# Patient Record
Sex: Female | Born: 1945
Health system: Southern US, Community
[De-identification: ages and names within clinical notes are randomized; demographics above are authoritative.]

## PROBLEM LIST (undated history)

## (undated) DIAGNOSIS — E785 Hyperlipidemia, unspecified: Secondary | ICD-10-CM

## (undated) DIAGNOSIS — N200 Calculus of kidney: Secondary | ICD-10-CM

## (undated) DIAGNOSIS — I10 Essential (primary) hypertension: Secondary | ICD-10-CM

## (undated) DIAGNOSIS — M722 Plantar fascial fibromatosis: Secondary | ICD-10-CM

## (undated) DIAGNOSIS — Z87898 Personal history of other specified conditions: Secondary | ICD-10-CM

## (undated) DIAGNOSIS — F419 Anxiety disorder, unspecified: Secondary | ICD-10-CM

## (undated) DIAGNOSIS — M48061 Spinal stenosis, lumbar region without neurogenic claudication: Secondary | ICD-10-CM

## (undated) HISTORY — DX: Calculus of kidney: N20.0

## (undated) HISTORY — DX: Plantar fascial fibromatosis: M72.2

## (undated) HISTORY — PX: OTHER SURGICAL HISTORY: SHX169

## (undated) HISTORY — DX: Spinal stenosis, lumbar region without neurogenic claudication: M48.061

## (undated) HISTORY — DX: Hyperlipidemia, unspecified: E78.5

## (undated) HISTORY — DX: Anxiety disorder, unspecified: F41.9

## (undated) HISTORY — DX: Personal history of other specified conditions: Z87.898

## (undated) HISTORY — PX: TONSILLECTOMY: SUR1361

## (undated) HISTORY — DX: Essential (primary) hypertension: I10

---

## 1998-11-23 ENCOUNTER — Other Ambulatory Visit: Admission: RE | Admit: 1998-11-23 | Discharge: 1998-11-23 | Payer: Self-pay | Admitting: Obstetrics and Gynecology

## 1999-08-23 ENCOUNTER — Encounter: Admission: RE | Admit: 1999-08-23 | Discharge: 1999-08-23 | Payer: Self-pay | Admitting: Emergency Medicine

## 1999-08-23 ENCOUNTER — Encounter: Payer: Self-pay | Admitting: Emergency Medicine

## 1999-09-29 ENCOUNTER — Encounter: Payer: Self-pay | Admitting: Emergency Medicine

## 1999-09-29 ENCOUNTER — Encounter: Admission: RE | Admit: 1999-09-29 | Discharge: 1999-09-29 | Payer: Self-pay | Admitting: Emergency Medicine

## 1999-12-01 ENCOUNTER — Other Ambulatory Visit: Admission: RE | Admit: 1999-12-01 | Discharge: 1999-12-01 | Payer: Self-pay | Admitting: Obstetrics and Gynecology

## 2000-10-11 ENCOUNTER — Encounter: Payer: Self-pay | Admitting: Obstetrics and Gynecology

## 2000-10-11 ENCOUNTER — Encounter: Admission: RE | Admit: 2000-10-11 | Discharge: 2000-10-11 | Payer: Self-pay | Admitting: Obstetrics and Gynecology

## 2000-12-26 ENCOUNTER — Other Ambulatory Visit: Admission: RE | Admit: 2000-12-26 | Discharge: 2000-12-26 | Payer: Self-pay | Admitting: Obstetrics and Gynecology

## 2001-11-13 ENCOUNTER — Encounter: Payer: Self-pay | Admitting: Obstetrics and Gynecology

## 2001-11-13 ENCOUNTER — Encounter: Admission: RE | Admit: 2001-11-13 | Discharge: 2001-11-13 | Payer: Self-pay | Admitting: Obstetrics and Gynecology

## 2002-01-03 ENCOUNTER — Other Ambulatory Visit: Admission: RE | Admit: 2002-01-03 | Discharge: 2002-01-03 | Payer: Self-pay | Admitting: Obstetrics and Gynecology

## 2002-11-26 ENCOUNTER — Encounter: Admission: RE | Admit: 2002-11-26 | Discharge: 2002-11-26 | Payer: Self-pay | Admitting: Obstetrics and Gynecology

## 2002-11-26 ENCOUNTER — Encounter: Payer: Self-pay | Admitting: Obstetrics and Gynecology

## 2002-11-26 ENCOUNTER — Encounter: Admission: RE | Admit: 2002-11-26 | Discharge: 2002-11-26 | Payer: Self-pay | Admitting: Internal Medicine

## 2003-12-09 ENCOUNTER — Ambulatory Visit (HOSPITAL_COMMUNITY): Admission: RE | Admit: 2003-12-09 | Discharge: 2003-12-09 | Payer: Self-pay | Admitting: Obstetrics and Gynecology

## 2004-02-03 ENCOUNTER — Other Ambulatory Visit: Admission: RE | Admit: 2004-02-03 | Discharge: 2004-02-03 | Payer: Self-pay | Admitting: Obstetrics and Gynecology

## 2004-02-16 ENCOUNTER — Encounter: Admission: RE | Admit: 2004-02-16 | Discharge: 2004-02-16 | Payer: Self-pay | Admitting: Obstetrics and Gynecology

## 2004-07-13 ENCOUNTER — Ambulatory Visit: Payer: Self-pay | Admitting: Internal Medicine

## 2004-08-16 ENCOUNTER — Ambulatory Visit: Payer: Self-pay | Admitting: Internal Medicine

## 2004-10-18 ENCOUNTER — Ambulatory Visit: Payer: Self-pay | Admitting: Internal Medicine

## 2004-10-19 ENCOUNTER — Ambulatory Visit: Payer: Self-pay | Admitting: Internal Medicine

## 2005-06-10 ENCOUNTER — Ambulatory Visit: Payer: Self-pay | Admitting: Internal Medicine

## 2005-06-13 ENCOUNTER — Ambulatory Visit: Payer: Self-pay | Admitting: Internal Medicine

## 2005-09-08 ENCOUNTER — Encounter: Admission: RE | Admit: 2005-09-08 | Discharge: 2005-09-08 | Payer: Self-pay | Admitting: *Deleted

## 2006-10-09 ENCOUNTER — Encounter: Admission: RE | Admit: 2006-10-09 | Discharge: 2006-10-09 | Payer: Self-pay | Admitting: *Deleted

## 2007-01-03 ENCOUNTER — Ambulatory Visit: Payer: Self-pay | Admitting: Internal Medicine

## 2007-01-03 LAB — CONVERTED CEMR LAB
AST: 18 units/L (ref 0–37)
Albumin: 4.3 g/dL (ref 3.5–5.2)
Alkaline Phosphatase: 44 units/L (ref 39–117)
BUN: 20 mg/dL (ref 6–23)
Calcium: 9.3 mg/dL (ref 8.4–10.5)
Chloride: 106 meq/L (ref 96–112)
Creatinine, Ser: 0.7 mg/dL (ref 0.4–1.2)
Direct LDL: 144 mg/dL
Free T4: 0.7 ng/dL (ref 0.6–1.6)
HDL: 40.9 mg/dL (ref >39.0)
Potassium: 4.3 meq/L (ref 3.5–5.1)
Sodium: 143 meq/L (ref 135–145)
Total CHOL/HDL Ratio: 5.5

## 2007-11-15 ENCOUNTER — Telehealth: Payer: Self-pay | Admitting: Internal Medicine

## 2007-12-05 ENCOUNTER — Encounter: Payer: Self-pay | Admitting: *Deleted

## 2007-12-05 DIAGNOSIS — E785 Hyperlipidemia, unspecified: Secondary | ICD-10-CM | POA: Insufficient documentation

## 2007-12-05 DIAGNOSIS — I10 Essential (primary) hypertension: Secondary | ICD-10-CM | POA: Insufficient documentation

## 2007-12-05 DIAGNOSIS — M722 Plantar fascial fibromatosis: Secondary | ICD-10-CM | POA: Insufficient documentation

## 2007-12-05 DIAGNOSIS — Z8659 Personal history of other mental and behavioral disorders: Secondary | ICD-10-CM | POA: Insufficient documentation

## 2007-12-05 DIAGNOSIS — Z9089 Acquired absence of other organs: Secondary | ICD-10-CM | POA: Insufficient documentation

## 2007-12-05 DIAGNOSIS — Z8679 Personal history of other diseases of the circulatory system: Secondary | ICD-10-CM | POA: Insufficient documentation

## 2007-12-18 ENCOUNTER — Ambulatory Visit (HOSPITAL_COMMUNITY): Admission: RE | Admit: 2007-12-18 | Discharge: 2007-12-18 | Payer: Self-pay | Admitting: Obstetrics & Gynecology

## 2008-01-31 ENCOUNTER — Ambulatory Visit: Payer: Self-pay | Admitting: Internal Medicine

## 2008-01-31 DIAGNOSIS — G609 Hereditary and idiopathic neuropathy, unspecified: Secondary | ICD-10-CM | POA: Insufficient documentation

## 2008-01-31 DIAGNOSIS — G4700 Insomnia, unspecified: Secondary | ICD-10-CM | POA: Insufficient documentation

## 2008-01-31 DIAGNOSIS — R002 Palpitations: Secondary | ICD-10-CM | POA: Insufficient documentation

## 2008-01-31 DIAGNOSIS — R209 Unspecified disturbances of skin sensation: Secondary | ICD-10-CM | POA: Insufficient documentation

## 2008-01-31 DIAGNOSIS — M25519 Pain in unspecified shoulder: Secondary | ICD-10-CM | POA: Insufficient documentation

## 2008-02-13 ENCOUNTER — Encounter: Payer: Self-pay | Admitting: Internal Medicine

## 2008-02-19 ENCOUNTER — Encounter: Admission: RE | Admit: 2008-02-19 | Discharge: 2008-02-19 | Payer: Self-pay | Admitting: Obstetrics and Gynecology

## 2008-03-03 ENCOUNTER — Telehealth: Payer: Self-pay | Admitting: Internal Medicine

## 2008-06-05 ENCOUNTER — Ambulatory Visit: Payer: Self-pay | Admitting: Internal Medicine

## 2008-06-05 LAB — CONVERTED CEMR LAB
Cholesterol: 238 mg/dL (ref 0–200)
Direct LDL: 166.7 mg/dL
GFR calc Af Amer: 110 mL/min
Glucose, Bld: 95 mg/dL (ref 70–99)
HDL: 43.5 mg/dL (ref >39.0)
Hgb A1c MFr Bld: 5.5 % (ref 4.6–6.0)
Potassium: 3.9 meq/L (ref 3.5–5.1)
Sodium: 141 meq/L (ref 135–145)

## 2008-06-06 ENCOUNTER — Encounter: Payer: Self-pay | Admitting: Internal Medicine

## 2009-01-20 ENCOUNTER — Ambulatory Visit (HOSPITAL_COMMUNITY): Admission: RE | Admit: 2009-01-20 | Discharge: 2009-01-20 | Payer: Self-pay | Admitting: Obstetrics and Gynecology

## 2009-02-24 ENCOUNTER — Telehealth: Payer: Self-pay | Admitting: Internal Medicine

## 2009-09-18 ENCOUNTER — Telehealth (INDEPENDENT_AMBULATORY_CARE_PROVIDER_SITE_OTHER): Payer: Self-pay | Admitting: *Deleted

## 2009-09-23 ENCOUNTER — Ambulatory Visit: Payer: Self-pay | Admitting: Internal Medicine

## 2009-09-23 LAB — CONVERTED CEMR LAB
ALT: 33 units/L (ref 0–35)
AST: 24 units/L (ref 0–37)
Albumin: 4.6 g/dL (ref 3.5–5.2)
Alkaline Phosphatase: 37 units/L — ABNORMAL LOW (ref 39–117)
BUN: 17 mg/dL (ref 6–23)
Basophils Relative: 0.9 % (ref 0.0–3.0)
Bilirubin, Direct: 0.1 mg/dL (ref 0.0–0.3)
Chloride: 106 meq/L (ref 96–112)
Cholesterol: 131 mg/dL (ref 0–200)
Creatinine, Ser: 0.7 mg/dL (ref 0.4–1.2)
GFR calc non Af Amer: 90.19 mL/min (ref >60)
HCT: 42.1 % (ref 36.0–46.0)
Hemoglobin, Urine: NEGATIVE
Hemoglobin: 14 g/dL (ref 12.0–15.0)
Hgb A1c MFr Bld: 5.6 % (ref 4.6–6.5)
LDL Cholesterol: 61 mg/dL (ref 0–99)
Lymphocytes Relative: 29.3 % (ref 12.0–46.0)
Monocytes Absolute: 0.4 10*3/uL (ref 0.1–1.0)
Monocytes Relative: 7.6 % (ref 3.0–12.0)
Neutro Abs: 2.9 10*3/uL (ref 1.4–7.7)
Neutrophils Relative %: 58.1 % (ref 43.0–77.0)
Platelets: 196 10*3/uL (ref 150.0–400.0)
RBC: 4.38 M/uL (ref 3.87–5.11)
RDW: 11.8 % (ref 11.5–14.6)
Sodium: 143 meq/L (ref 135–145)
Specific Gravity, Urine: 1.02 (ref 1.000–1.030)
TSH: 1.01 microintl units/mL (ref 0.35–5.50)
Urine Glucose: NEGATIVE mg/dL
pH: 6.5 (ref 5.0–8.0)

## 2009-09-30 ENCOUNTER — Ambulatory Visit: Payer: Self-pay | Admitting: Internal Medicine

## 2010-02-09 ENCOUNTER — Ambulatory Visit (HOSPITAL_COMMUNITY): Admission: RE | Admit: 2010-02-09 | Discharge: 2010-02-09 | Payer: Self-pay | Admitting: Obstetrics and Gynecology

## 2010-10-07 NOTE — Progress Notes (Signed)
Summary: LABS  Phone Note Call from Patient Call back at Home Phone [phone] Call back at [phone]   Caller: Patient Call For: Jacques Navy MD Summary of Call: Pt sched CPX for 09/30/09 with labs previous week, pt requesting labs for diabetes,thyroid and blood test for ovarian ca - if this is possible. Please advise. CPX labs are in computer for wk of 1/18. Initial call taken by: Verdell Face,  September 18, 2009 10:52 AM  Follow-up for Phone Call        routine CPX should include TSH for thyroid, serum glucose. OK to add AIC 790.6. There is no recommendation for any tumor marker screening for ovarian cancer in the absence of real risk factors, i.e. 1st degree relative with ovarian cancer or any clinical signs of pelvic pathology.  Follow-up by: Jacques Navy MD,  September 18, 2009 5:51 PM  Additional Follow-up for Phone Call Additional follow up Details #1::        Pt called back to see about these labs, CA 125 is the lab she is requesting, please call pt. Additional Follow-up by: Verdell Face,  September 21, 2009 9:30 AM    Additional Follow-up for Phone Call Additional follow up Details #2::    I spoke with patient. Please add A1c 790.6 to existing labs. THANKS.....................Marland KitchenLamar Sprinkles, CMA  September 21, 2009 10:03 AM   Additional Follow-up for Phone Call Additional follow up Details #3:: Details for Additional Follow-up Action Taken: Labs added to order. Additional Follow-up by: Verdell Face,  September 21, 2009 10:07 AM

## 2010-10-07 NOTE — Assessment & Plan Note (Signed)
Summary: CPX/BCBS/#/CD   Vital Signs:  Patient profile:   65 year old female Height:      66 inches Weight:      154 pounds BMI:     24.95 O2 Sat:      97 % on Room air Temp:     98.1 degrees F oral Pulse rate:   68 / minute BP sitting:   126 / 88  (left arm) Cuff size:   regular  Vitals Entered By: Bill Salinas CMA (September 30, 2009 11:32 AM)  O2 Flow:  Room air  Primary Care Provider:  norins   History of Present Illness: Patient presents for a routine exam.  At bedtime she will have a sense of hard to describe discomfort that is transient  On-going problem with burning feet, worse over the great toes. Chart reveiwed: nerve conduction study done in '09 was normal.   Onychyomycosis of the left great nail.   MSK- right shoulder pain (h/o bursitis), right hip pain, right knee pain with lesser pain left knee, hands are stiffness without morning gel and sore with enlarged PIP joints. She did get relief with aleve.  Occasional tickle type cough. Her mother was a non-smoker who got lung cancer. Several aunts also had lung disease (cancer?)   Current Medications (verified): 1)  Metoprolol Tartrate 50 Mg  Tabs (Metoprolol Tartrate) .Marland Kitchen.. 1 Two Times A Day Untill Toprol Xl Is Back 2)  Aspirin 81 Mg  Tabs (Aspirin) .... Take 1 Tablet By Mouth Once A Day 3)  Biotin 1000 Mcg  Tabs (Biotin) .... Take 1 Tablet By Mouth Once A Day 4)  Multivitamins   Tabs (Multiple Vitamin) .... Take Once Daily 5)  Caltrate 600+d 600-400 Mg-Unit  Tabs (Calcium Carbonate-Vitamin D) .... Take Once Daily 6)  Acidophilus   Tabs (Lactobacillus) .... Take Daily As Directed. 7)  Simvastatin 40 Mg Tabs (Simvastatin) .Marland Kitchen.. 1 By Mouth Q Pm 8)  Lamisil 250 Mg Tabs (Terbinafine Hcl)  Allergies (verified): No Known Drug Allergies  Past History:  Past Medical History: Last updated: 12/05/2007 ANXIETY DISORDER, HX OF (ICD-V11.2) PLANTAR FASCIITIS (ICD-728.71) CHEST PAIN, HX OF (ICD-V12.50) HYPERLIPIDEMIA  (ICD-272.4) HYPERTENSION (ICD-401.9)    Past Surgical History: Last updated: 12/05/2007 CAESAREAN SECTION, HX OF (ICD-V39.01) TONSILLECTOMY, HX OF (ICD-V45.79)  Family History: Last updated: 2008/06/16 Father - deceased @ 30: CAd/MI-fatal, epilectic due to war injury Mother - deceased @ 53: Lung cancer (non-smoker),  Neg- breast or colon cancer; DM  Social History: Last updated: Jun 16, 2008 HSG, community college Married '68 1 daughter - '80, 1 son - '77; 2 grandchildren work: was a Diplomatic Services operational officer, full-time homemaker marriage in good health End-of-Care: no CPR, no mechanical ventilation, no futile or heroic measures.   Risk Factors: Alcohol Use: <1 (2008/06/16) Caffeine Use: 1 (06/16/2008) Exercise: no (June 16, 2008)  Risk Factors: Smoking Status: quit (12/05/2007) Passive Smoke Exposure: no (16-Jun-2008)  Review of Systems  The patient denies anorexia, fever, weight loss, weight gain, vision loss, chest pain, syncope, dyspnea on exertion, prolonged cough, headaches, abdominal pain, melena, hematochezia, severe indigestion/heartburn, incontinence, muscle weakness, difficulty walking, depression, abnormal bleeding, enlarged lymph nodes, and angioedema.    Physical Exam  General:  Well-developed,well-nourished,in no acute distress; alert,appropriate and cooperative throughout examination Head:  Normocephalic and atraumatic without obvious abnormalities. No apparent alopecia or balding. Eyes:  pupils equal, pupils round, corneas and lenses clear, no injection, and no optic disk abnormalities.   Ears:  R ear normal and L ear normal.   Nose:  no  external deformity and no external erythema.   Mouth:  Oral mucosa and oropharynx without lesions or exudates.  Teeth in good repair. Neck:  full ROM, no thyromegaly, and no carotid bruits.   Breasts:  deferred to gyn Lungs:  Normal respiratory effort, chest expands symmetrically. Lungs are clear to auscultation, no crackles or  wheezes. Heart:  Normal rate and regular rhythm. S1 and S2 normal without gallop, murmur, click, rub or other extra sounds. Abdomen:  soft, normal bowel sounds, no masses, no guarding, and no hepatomegaly.   Genitalia:  deferred to gyn Msk:  normal ROM, no joint tenderness, no joint warmth, no redness over joints, and no joint deformities.  Mild enlargement of the PIP joint ring finer right hand. Pulses:  2+ radial and DP pulses Extremities:  No clubbing, cyanosis, edema, or deformity noted with normal full range of motion of all joints.   Neurologic:  No cranial nerve deficits noted. Station and gait are normal. Plantar reflexes are down-going bilaterally. DTRs are symmetrical throughout. Sensory, motor and coordinative functions appear intact. Skin:  turgor normal, color normal, no rashes, no suspicious lesions, no petechiae, and no ulcerations.  Minimal thickening of the left great toenail. Cervical Nodes:  no anterior cervical adenopathy and no posterior cervical adenopathy.   Psych:  Oriented X3, memory intact for recent and remote, normally interactive, good eye contact, and not anxious appearing.    Patient: Kimberly Cox Note: All result statuses are Final unless otherwise noted.  Tests: (1) BMP (METABOL)   Sodium                    143 mEq/L                   135-145   Potassium                 3.8 mEq/L                   3.5-5.1   Chloride                  106 mEq/L                   96-112   Carbon Dioxide            32 mEq/L                    19-32   Glucose                   89 mg/dL                    19-14   BUN                       17 mg/dL                    7-82   Creatinine                0.7 mg/dL                   9.5-6.2   Calcium                   9.5 mg/dL                   1.3-08.6   GFR  90.19 mL/min                >60  Tests: (2) CBC Platelet w/Diff (CBCD)   White Cell Count          4.9 K/uL                    4.5-10.5   Red Cell Count             4.38 Mil/uL                 3.87-5.11   Hemoglobin                14.0 g/dL                   16.1-09.6   Hematocrit                42.1 %                      36.0-46.0   MCV                       96.1 fl                     78.0-100.0   MCHC                      33.2 g/dL                   04.5-40.9   RDW                       11.8 %                      11.5-14.6   Platelet Count            196.0 K/uL                  150.0-400.0   Neutrophil %              58.1 %                      43.0-77.0   Lymphocyte %              29.3 %                      12.0-46.0   Monocyte %                7.6 %                       3.0-12.0   Eosinophils%              4.1 %                       0.0-5.0   Basophils %               0.9 %                       0.0-3.0   Neutrophill Absolute      2.9 K/uL                    1.4-7.7   Lymphocyte Absolute  1.4 K/uL                    0.7-4.0   Monocyte Absolute         0.4 K/uL                    0.1-1.0  Eosinophils, Absolute                             0.2 K/uL                    0.0-0.7   Basophils Absolute        0.0 K/uL                    0.0-0.1  Tests: (3) Hepatic/Liver Function Panel (HEPATIC)   Total Bilirubin           0.9 mg/dL                   1.6-1.0   Direct Bilirubin          0.1 mg/dL                   9.6-0.4   Alkaline Phosphatase [L]  37 U/L                      39-117   AST                       24 U/L                      0-37   ALT                       33 U/L                      0-35   Total Protein             6.9 g/dL                    5.4-0.9   Albumin                   4.6 g/dL                    8.1-1.9  Tests: (4) TSH (TSH)   FastTSH                   1.01 uIU/mL                 0.35-5.50  Tests: (5) Lipid Panel (LIPID)   Cholesterol               131 mg/dL                   1-478     ATP III Classification            Desirable:  < 200 mg/dL                    Borderline High:  200 - 239 mg/dL                High:  > = 240 mg/dL   Triglycerides  106.0 mg/dL                 4.0-981.1     Normal:  <150 mg/dL     Borderline High:  914 - 199 mg/dL   HDL                       78.29 mg/dL                 >56.21   VLDL Cholesterol          21.2 mg/dL                  3.0-86.5   LDL Cholesterol           61 mg/dL                    7-84  CHO/HDL Ratio:  CHD Risk                             3                    Men          Women     1/2 Average Risk     3.4          3.3     Average Risk          5.0          4.4     2X Average Risk          9.6          7.1     3X Average Risk          15.0          11.0                           Tests: (6) UDip Only (UDIP)   Color                     LT. YELLOW       RANGE:  Yellow;Lt. Yellow   Clarity                   CLEAR                       Clear   Specific Gravity          1.020                       1.000 - 1.030   Urine Ph                  6.5                         5.0-8.0   Protein                   TRACE                       Negative   Urine Glucose             NEGATIVE                    Negative   Ketones  TRACE                       Negative   Urine Bilirubin           NEGATIVE                    Negative   Blood                     NEGATIVE                    Negative   Urobilinogen              0.2                         0.0 - 1.0   Leukocyte Esterace        NEGATIVE                    Negative   Nitrite                   NEGATIVE                    Negative  Tests: (7) Hemoglobin A1C (A1C)   Hemoglobin A1C            5.6 %                       4.6-6.5     Glycemic Control Guidelines for People with Diabetes:     Non Diabetic:  <6%     Goal of Therapy: <7%     Additional Action Suggested:  >8%  DG CHEST 2 VIEW - [id number]   Clinical Data: Chest pain, cough, former smoker   CHEST - 2 VIEW   Comparison: None   Findings: The lungs are clear.  Mediastinal contours are normal. The heart is within normal  limits in size.  No bony abnormality is seen.   IMPRESSION: No active lung disease.   Read By:  Juline Patch,  M.D.  Impression & Recommendations:  Problem # 1:  DISTURBANCE OF SKIN SENSATION (ICD-782.0) Continued burning feet. No evidence of diabetes. Her MCV is normal making B12 deficiency unlikely. Nerve conduction study of '09 normal. Exam is normal. Discussed treatment options including Lyrica, gabapentin or amytriptyline.  Plan - continued watchful waiting - however no evidence of any significant underlying disease  Problem # 2:  SHOULDER PAIN, LEFT (ICD-719.41) Continued discomfort as well as generalized mild joint pain. Exam negative for inflammatory changes, i.e. Rheumatoid arthritis and suspect this is osteoarthritis at an early stage.  Plan  range of motion exercise, i.e. yoga, tai chi, etc.         heat          over the counter NSAIDs, e.g. aleve  Her updated medication list for this problem includes:    Aspirin 81 Mg Tabs (Aspirin) .Marland Kitchen... Take 1 tablet by mouth once a day  Problem # 3:  PALPITATIONS, OCCASIONAL (ICD-785.1) Seems stable. EKG with sinus rhythm with no PVCs or other abnormalities. Reveiwed prior event recorder report - occasional PVCs with no significant abnormal rhythm. Her symptoms are quiesscent.  Plan - continue beta-blocker.  Her updated medication list for this problem includes:    Metoprolol Tartrate 50 Mg Tabs (Metoprolol tartrate) .Marland Kitchen... 1 two times a day untill toprol  xl is back  Problem # 4:  HYPERLIPIDEMIA (ICD-272.4) Great control with LDL 61 down from 166 prior to taking medication.  Plan - continue present meds.  The following medications were removed from the medication list:    Policosanol 10 Mg Caps (Policosanol) .Marland Kitchen... Take twice daily Her updated medication list for this problem includes:    Simvastatin 40 Mg Tabs (Simvastatin) .Marland Kitchen... 1 by mouth q pm  Problem # 5:  HYPERTENSION (ICD-401.9)  Her updated medication list for this  problem includes:    Metoprolol Tartrate 50 Mg Tabs (Metoprolol tartrate) .Marland Kitchen... 1 two times a day untill toprol xl is back  BP today: 126/88 Prior BP: 130/82 (06/05/2008)  Good control with no lab abnormality.  Plan - continue beta-blocker.  Problem # 6:  Preventive Health Care (ICD-V70.0) She is current with her gyn, current with mammography, current with colorectal cancer screening. Her exam is normal. EKG with left anterior fasicular  block with no evidence of ischemic hear disease. Chest x-ray is clear. Labs are excellent.   In summary - a nice woman with stable problems with no evidence of worrisome underlying disease. If there is concern for occult disease I have suggested a full executive type evaluation at [facility], [facility]. She will return on a as needed basis or in one year.  Complete Medication List: 1)  Metoprolol Tartrate 50 Mg Tabs (Metoprolol tartrate) .Marland Kitchen.. 1 two times a day untill toprol xl is back 2)  Aspirin 81 Mg Tabs (Aspirin) .... Take 1 tablet by mouth once a day 3)  Biotin 1000 Mcg Tabs (Biotin) .... Take 1 tablet by mouth once a day 4)  Multivitamins Tabs (Multiple vitamin) .... Take once daily 5)  Caltrate 600+d 600-400 Mg-unit Tabs (Calcium carbonate-vitamin d) .... Take once daily 6)  Acidophilus Tabs (Lactobacillus) .... Take daily as directed. 7)  Simvastatin 40 Mg Tabs (Simvastatin) .Marland Kitchen.. 1 by mouth q pm 8)  Lamisil 250 Mg Tabs (Terbinafine hcl)  Other Orders: T-2 View CXR (71020TC) Admin 1st Vaccine (16109) Flu Vaccine 33yrs + (60454)     Flu Vaccine Consent Questions     Do you have a history of severe allergic reactions to this vaccine? no    Any prior history of allergic reactions to egg and/or gelatin? no    Do you have a sensitivity to the preservative Thimersol? no    Do you have a past history of Guillan-Barre Syndrome? no    Do you currently have an acute febrile illness? no    Have you ever had a severe reaction to latex?  no    Vaccine information given and explained to patient? yes    Are you currently pregnant? no    Lot Number:[id number]   Exp Date:03/04/2010   Site Given right Deltoid IM

## 2011-01-10 ENCOUNTER — Other Ambulatory Visit: Payer: Self-pay | Admitting: Internal Medicine

## 2011-01-21 NOTE — Assessment & Plan Note (Signed)
[facility]                           PRIMARY CARE OFFICE NOTE   Kimberly Cox, Kimberly Cox                       MRN:          [id number]  DATE:01/03/2007                            DOB:          [birth year]    HISTORY OF PRESENT ILLNESS:  Kimberly Cox is a 65 year old Caucasian  woman who presents for follow up evaluation and exam.  She was last seen  in the office June 13, 2005, for follow up of blood pressure.  The  patient, also at that time, had hyperlipidemia with an LDL cholesterol  that was 829.5.  I discussed this with her at the time and recommended  Vytorin.  In the interval, she has not taken any medication, preferring  lifestyle management, and using over-the-counter herbal remedies.   CHIEF COMPLAINT:  1. The patient reports an episode of being lightheaded and slightly      dizzy after along day in the car.  She reports taking her blood      pressure in associated with these symptoms, and it was mildly      elevated.  2. Hypertension.  The patient reports she is usually well controlled,      checking her blood pressure at home.  3. The patient complains of the sensation of having very hot feet and      hands at night, also has noted taut skin.  She does not seem to      have any temperature regulation problems during the day time.  4. MSK.  The patient reports she is having increasing joint pain in      her hands with increasing morning stiffness.   PAST SURGICAL HISTORY:  1. Tonsillectomy, remote.  2. C-section with her second child.   PAST MEDICAL HISTORY:  1. Usual childhood disease.  2. Hypertension.  3. History of chest pain with negative workup including negative      Cardiolite study in 2001.  4. Hyperlipidemia.  5. Plantar fasciitis.   GYNECOLOGY HISTORY:  The patient is gravida 2, para 2 and is  postmenopausal.   FAMILY HISTORY:  Well documented in my chart notes.   SOCIAL HISTORY:  Likewise, well documented with no  significant change.   CURRENT MEDICATIONS:  1. Aspirin 81 mg two tablets daily.  2. Biotin 5 mg daily.  3. Multivitamin daily.  4. Folic acid daily.  5. Toprol XL 50 mg daily.  6. Caltrate with D daily.  7. Policosanol, herbal for cholesterol, twice daily.   HEALTH MAINTENANCE:  The patient reports seeing Dr. Gildardo Griffes in  March 2008 with a normal pelvic and Pap smear.  The patient reports  having a normal mammogram in 2008.  Her last colonoscopy was in 2004 and  was a normal study with repeat in 2014.   REVIEW OF SYSTEMS:  The patient has had no fever, sweats or chills or  other constitutional symptoms.  She has had an eye exam in the last 12  months.  No ENT complaints.  CARDIOVASCULAR:  Significant for very rare  palpitations, but no chest pain, chest discomfort or  decreased exercise  tolerance.  No respiratory, GI or GU complaints.  No musculoskeletal  pains except as noted above.   PHYSICAL EXAMINATION:  VITAL SIGNS:  Temperature 98.1, blood pressure  142/89, pulse 67, weight 150, BMI 23.7 in normal range.  GENERAL APPEARANCE:  This is a well-developed, well-nourished, well-  groomed woman in no acute distress.  HEENT:  Normocephalic, atraumatic.  EAC's and TM's are normal.  Oropharynx was negative.  Dentition in good repair.  Conjunctivae and  sclerae were clear.  PERRLA.  Funduscopic examination was referred to  Ophthalmology.  NECK:  Supple without thyromegaly.  NODES:  No adenopathy was noted in the cervical or supraclavicular  regions.  CHEST:  No CVA tenderness.  LUNGS:  Clear to auscultation and percussion.  BREASTS:  Deferred to Gynecology.  CARDIOVASCULAR:  Radial pulses 2+.  She had a quite precordium with  regular rate and rhythm without murmurs, rubs or gallops.  She had good  dorsalis pedis pulses.  ABDOMEN:  Soft, no guarding or rebound.  No organosplenomegaly was  noted.  RECTAL/VAGINAL:  Deferred to Gynecology.  EXTREMITIES:  Without cyanosis,  clubbing, edema or deformity.  No  synovial thickening.  No joint erythema or enlargement.  She has full  range of motion.  SKIN:  Clear.   DATABASE:  Lipid panel with a cholesterol of 224, triglycerides 194, HDL  was 40.9, LDL cholesterol was 144, free T4 was 0.7 in normal range, free  T3 was 2.5 in normal range.  TSH was 1.0 in normal range.  Comprehensive  metabolic panel revealed normal electrolytes.  Blood sugar was 101.  Kidney function was normal with creatinine of 0.7.  Liver functions were  normal.   ASSESSMENT/PLAN:  1. Hypertension.  The patient's blood pressure seems adequately      controlled on her present medical regimen.  She will continue the      same.  2. Sensation of hot feet and hands at night.  The patient's thyroid      functions were normal.  Physical exam was unremarkable.  She has no      evidence or other symptoms of the climacteric.  Although, this is a      possibility.  No other metabolic derangement is suggested.  Plan is      watchful waiting.  Further evaluation if her symptoms persist or      become worse.  3. MSK.  Patient with mild osteoarthritis with no joint deformity or      involvement.  Would have her use __________ of choice.  Heat to      warm up her hands in the morning, and non-steroidal anti-      inflammatory drugs as needed.  Range of motion exercises on a      regular basis.  4. Lipids.  The patient has done a good job in regards to controlling      her LDL cholesterol and for accommodation of appropriate diet as      well as what every herbal products she is taking, having brought      her LDL cholesterol down from 045.4 to 144.  Ideal would be for the      patient to have an LDL of less than 130.  However, NCEP guidelines      would not recommend treatment for an LDL cholesterol that is less      than 160.  Plan is for patient to continue on her present regimen.  5.  Health maintenance.  The patient is current and up to date with      both gynecologic care as well as colorectal cancer screening and      mammography.   SUMMARY:  This is a very pleasant woman with minor problems as outlined  above.  She should return to see me on a p.r.n. basis or in one year.     Rosalyn Gess Norins, MD  Electronically Signed    MEN/MedQ  DD: 01/03/2007  DT: 01/04/2007  Job #: [id number]   cc:   Drue Novel

## 2011-02-02 ENCOUNTER — Other Ambulatory Visit: Payer: Self-pay | Admitting: Internal Medicine

## 2011-02-02 NOTE — Telephone Encounter (Signed)
Rx done. No refills--last OV 09/2009--note to pharmacy for Pt/must have OV for future refills.

## 2011-02-09 ENCOUNTER — Other Ambulatory Visit: Payer: Self-pay | Admitting: Internal Medicine

## 2011-03-07 ENCOUNTER — Other Ambulatory Visit: Payer: Self-pay | Admitting: Internal Medicine

## 2011-03-14 ENCOUNTER — Encounter: Payer: Self-pay | Admitting: Internal Medicine

## 2011-03-14 ENCOUNTER — Ambulatory Visit (INDEPENDENT_AMBULATORY_CARE_PROVIDER_SITE_OTHER)
Admission: RE | Admit: 2011-03-14 | Discharge: 2011-03-14 | Disposition: A | Discharge status: Home / Self Care | Payer: BC Managed Care – PPO | Source: Ambulatory Visit | Attending: Internal Medicine | Admitting: Internal Medicine

## 2011-03-14 ENCOUNTER — Other Ambulatory Visit (HOSPITAL_COMMUNITY): Payer: Self-pay | Admitting: Obstetrics and Gynecology

## 2011-03-14 ENCOUNTER — Ambulatory Visit (INDEPENDENT_AMBULATORY_CARE_PROVIDER_SITE_OTHER): Payer: BC Managed Care – PPO | Admitting: Internal Medicine

## 2011-03-14 VITALS — BP 138/82 | HR 73 | Temp 97.9°F | Wt 153.0 lb

## 2011-03-14 DIAGNOSIS — Z1231 Encounter for screening mammogram for malignant neoplasm of breast: Secondary | ICD-10-CM

## 2011-03-14 DIAGNOSIS — M79602 Pain in left arm: Secondary | ICD-10-CM

## 2011-03-14 DIAGNOSIS — R209 Unspecified disturbances of skin sensation: Secondary | ICD-10-CM

## 2011-03-14 DIAGNOSIS — M79609 Pain in unspecified limb: Secondary | ICD-10-CM

## 2011-03-14 NOTE — Patient Instructions (Signed)
Arm pain - exam does not reveal any serious deficit to suggest a severe problem. May very well have a problem with nerve root pressure at C5-6. Plan - x-rays of the neck to assess for degenerative joint or disk disease. For discomfort ok to continue with ibuprofen 800mg  3 times a day - do watch for any GI irritation. If the x-rays are OK and the pain is not relieved with ibuprofen will consider a short course of steroids.

## 2011-03-14 NOTE — Progress Notes (Signed)
  Subjective:    Patient ID: Kimberly Cox, female    DOB: 03/15/1946, 65 y.o.   MRN: [id number]  HPI Mrs. Smead presents with a 10 day h/o diiscomfort in the left arm and hand. She describes very mild weakness and very mild paresthesia, mostly affecting the thumb. She does recall carrying her 42 lb grandchild the night before the discomfort started. She does report that 20 years ago she was evaluated for similar symptoms including NCS that indicated possible C5-6 disk degeneration.   PMH, FamHx and SocHx reviewed for any changes and relevance.    Review of Systems  Constitutional: Negative.   HENT: Negative.   Eyes: Negative.   Respiratory: Negative.   Cardiovascular: Negative.   Gastrointestinal: Negative.   Genitourinary: Negative.   Musculoskeletal:       See HPI for arm pain   Neurological: Positive for numbness. Negative for tremors, facial asymmetry, weakness and headaches.  Hematological: Negative.   Psychiatric/Behavioral: Negative.        Objective:   Physical Exam Vitls noted Gen'l - WNWD white woman in no distress Neck - full active ROM Neuro - left UE and hand with normal strength, normal DTRs, normal sensation to light touch and pin prick; minimally decreased sensation to deep vibratory stimulus.        Assessment & Plan:

## 2011-03-15 ENCOUNTER — Telehealth: Payer: Self-pay | Admitting: *Deleted

## 2011-03-15 MED ORDER — PREDNISONE 10 MG PO TABS: 10.0000 mg | ORAL_TABLET | Freq: Every day | ORAL | Status: DC

## 2011-03-15 NOTE — Telephone Encounter (Signed)
Patient requesting results of xray and would like rx if needed.

## 2011-03-15 NOTE — Assessment & Plan Note (Signed)
Aching pain in the left arm with minimal paresthesia left thumb. Exam is unrevealing for any joint abnormality and there are no radicular findings on exam.  Plan - C-spine films to assess for DDD C5-6 level.  Addendum:  Clinical Data: Left arm/hand pain, evaluate for C5-6 nerve root  encroachment  CERVICAL SPINE - 2-3 VIEW  Comparison: None.  Findings: The cervical spine is visualized to C7 on the lateral  view.  Straightening of the cervical spine, likely positional.  No evidence of fracture or dislocation. The vertebral body heights  are maintained. The dens appears intact. Lateral masses of C1 are  symmetric.  No prevertebral soft tissue swelling.  Multilevel degenerative changes of the lower cervical spine.  IMPRESSION:  No fracture or dislocation is seen.  Multilevel degenerative changes of the lower cervical spine.  Please note that bilateral oblique views to evaluate the neural  foramina were not obtained.  Original Report Authenticated By: Charline Bills, M.D.  On review of images there is loss of disk height worse in the lower cervical region along with degenerative changes  Suspect minor nerve root compression - but no marked radicular findings  Plan - prednisone burst and taper.

## 2011-03-15 NOTE — Telephone Encounter (Signed)
Patient called back again upset b/c she was told that MD would get results that day and hear about treatment plan. Spoke w/pt and explained plan and that rx should be ready for pickup.

## 2011-03-15 NOTE — Telephone Encounter (Signed)
Kimberly Cox was supposed to call - copied office note to her with Rx for prednisone burst and taper sent to pharmacy already.

## 2011-04-14 ENCOUNTER — Other Ambulatory Visit: Payer: Self-pay | Admitting: Internal Medicine

## 2011-06-07 ENCOUNTER — Ambulatory Visit (HOSPITAL_COMMUNITY)
Admission: RE | Admit: 2011-06-07 | Discharge: 2011-06-07 | Disposition: A | Discharge status: Home / Self Care | Payer: BC Managed Care – PPO | Source: Ambulatory Visit | Attending: Obstetrics and Gynecology | Admitting: Obstetrics and Gynecology

## 2011-06-07 DIAGNOSIS — Z1231 Encounter for screening mammogram for malignant neoplasm of breast: Secondary | ICD-10-CM

## 2011-10-05 ENCOUNTER — Other Ambulatory Visit: Payer: Self-pay | Admitting: Internal Medicine

## 2011-10-05 NOTE — Telephone Encounter (Signed)
Done

## 2011-10-15 ENCOUNTER — Other Ambulatory Visit: Payer: Self-pay | Admitting: Internal Medicine

## 2011-10-17 NOTE — Telephone Encounter (Signed)
Done

## 2011-11-25 ENCOUNTER — Other Ambulatory Visit: Payer: Self-pay

## 2011-11-25 MED ORDER — PREDNISONE 10 MG PO TABS: 10.0000 mg | ORAL_TABLET | Freq: Every day | ORAL | Status: AC

## 2011-11-25 NOTE — Telephone Encounter (Signed)
Pt called stating that she is experiencing arm pain related to pinched nerve ( seen for same 03/2011 by [facility]). Pt is requesting rx refill of prednisone, please advise.

## 2011-11-26 NOTE — Telephone Encounter (Signed)
Pt advised that Rx has been approved and to call back with any further questions or concerns.

## 2011-12-07 ENCOUNTER — Encounter: Payer: Self-pay | Admitting: Internal Medicine

## 2011-12-28 ENCOUNTER — Ambulatory Visit: Payer: BC Managed Care – PPO | Admitting: Internal Medicine

## 2012-01-17 ENCOUNTER — Ambulatory Visit: Payer: BC Managed Care – PPO | Admitting: Internal Medicine

## 2012-01-17 DIAGNOSIS — Z0289 Encounter for other administrative examinations: Secondary | ICD-10-CM

## 2012-05-10 ENCOUNTER — Other Ambulatory Visit (HOSPITAL_COMMUNITY): Payer: Self-pay | Admitting: Obstetrics and Gynecology

## 2012-05-10 DIAGNOSIS — Z1231 Encounter for screening mammogram for malignant neoplasm of breast: Secondary | ICD-10-CM

## 2012-06-12 ENCOUNTER — Ambulatory Visit (HOSPITAL_COMMUNITY)
Admission: RE | Admit: 2012-06-12 | Discharge: 2012-06-12 | Disposition: A | Discharge status: Home / Self Care | Payer: BC Managed Care – PPO | Source: Ambulatory Visit | Attending: Obstetrics and Gynecology | Admitting: Obstetrics and Gynecology

## 2012-06-12 DIAGNOSIS — Z1231 Encounter for screening mammogram for malignant neoplasm of breast: Secondary | ICD-10-CM | POA: Insufficient documentation

## 2012-07-22 ENCOUNTER — Other Ambulatory Visit: Payer: Self-pay | Admitting: Internal Medicine

## 2012-07-27 ENCOUNTER — Encounter: Payer: Self-pay | Admitting: Internal Medicine

## 2012-07-27 ENCOUNTER — Other Ambulatory Visit (INDEPENDENT_AMBULATORY_CARE_PROVIDER_SITE_OTHER): Payer: BC Managed Care – PPO

## 2012-07-27 ENCOUNTER — Ambulatory Visit (INDEPENDENT_AMBULATORY_CARE_PROVIDER_SITE_OTHER): Payer: BC Managed Care – PPO | Admitting: Internal Medicine

## 2012-07-27 VITALS — BP 176/100 | HR 80 | Temp 98.8°F | Resp 16 | Ht 65.5 in | Wt 157.0 lb

## 2012-07-27 DIAGNOSIS — I10 Essential (primary) hypertension: Secondary | ICD-10-CM

## 2012-07-27 DIAGNOSIS — Z23 Encounter for immunization: Secondary | ICD-10-CM

## 2012-07-27 LAB — URINALYSIS, ROUTINE W REFLEX MICROSCOPIC
Bilirubin Urine: NEGATIVE
Ketones, ur: NEGATIVE
Specific Gravity, Urine: 1.025 (ref 1.000–1.030)
Total Protein, Urine: 30
Urine Glucose: NEGATIVE
pH: 6.5 (ref 5.0–8.0)

## 2012-07-27 LAB — CBC WITH DIFFERENTIAL/PLATELET
Basophils Absolute: 0 10*3/uL (ref 0.0–0.1)
Basophils Relative: 0.4 % (ref 0.0–3.0)
Eosinophils Absolute: 0.2 10*3/uL (ref 0.0–0.7)
Hemoglobin: 14.9 g/dL (ref 12.0–15.0)
Lymphocytes Relative: 30.2 % (ref 12.0–46.0)
MCHC: 33.2 g/dL (ref 30.0–36.0)
Monocytes Relative: 7.5 % (ref 3.0–12.0)
Neutro Abs: 3.4 10*3/uL (ref 1.4–7.7)
Neutrophils Relative %: 59.1 % (ref 43.0–77.0)
RBC: 4.76 Mil/uL (ref 3.87–5.11)

## 2012-07-27 LAB — BASIC METABOLIC PANEL
BUN: 19 mg/dL (ref 6–23)
CO2: 29 mEq/L (ref 19–32)
Chloride: 105 mEq/L (ref 96–112)
Creatinine, Ser: 0.8 mg/dL (ref 0.4–1.2)
Potassium: 3.8 mEq/L (ref 3.5–5.1)

## 2012-07-27 MED ORDER — OLMESARTAN MEDOXOMIL-HCTZ 20-12.5 MG PO TABS: 1.0000 | ORAL_TABLET | Freq: Every day | ORAL | Status: DC

## 2012-07-27 NOTE — Progress Notes (Signed)
Subjective:    Patient ID: Kimberly Cox, female    DOB: [birth year], 66 y.o.   MRN: [id number]  Hypertension This is a chronic problem. The current episode started more than 1 year ago. The problem has been gradually worsening since onset. The problem is uncontrolled. Pertinent negatives include no anxiety, blurred vision, chest pain, headaches, malaise/fatigue, neck pain, orthopnea, palpitations, peripheral edema, PND, shortness of breath or sweats. Agents associated with hypertension include NSAIDs. Risk factors for coronary artery disease include post-menopausal state. Past treatments include beta blockers. Compliance problems include psychosocial issues, exercise and diet.       Review of Systems  Constitutional: Negative.  Negative for malaise/fatigue.  HENT: Positive for nosebleeds. Negative for hearing loss, ear pain, congestion, sore throat, facial swelling, rhinorrhea, sneezing, drooling, mouth sores, trouble swallowing, neck pain, neck stiffness, dental problem, voice change, postnasal drip, sinus pressure, tinnitus and ear discharge.   Eyes: Negative for blurred vision.  Respiratory: Negative for apnea, cough, choking, chest tightness, shortness of breath, wheezing and stridor.   Cardiovascular: Negative for chest pain, palpitations, orthopnea, leg swelling and PND.  Gastrointestinal: Negative for nausea, vomiting, abdominal pain, diarrhea and constipation.  Genitourinary: Negative for urgency, frequency, hematuria, flank pain, decreased urine volume, enuresis and difficulty urinating.  Musculoskeletal: Negative for myalgias, back pain, joint swelling, arthralgias and gait problem.  Skin: Negative.   Neurological: Negative for dizziness, syncope, weakness, light-headedness and headaches.  Hematological: Negative for adenopathy. Does not bruise/bleed easily.  Psychiatric/Behavioral: Negative.        Objective:   Physical Exam  Vitals reviewed. Constitutional: She is oriented  to person, place, and time. She appears well-developed and well-nourished.  HENT:  Head: Normocephalic and atraumatic. No trismus in the jaw.  Nose: Nasal deformity (left lateral nasal mucosa shows a tiny excoriated superficial vessel) present. No mucosal edema, rhinorrhea, nose lacerations, sinus tenderness, septal deviation or nasal septal hematoma. No epistaxis.  No foreign bodies. Right sinus exhibits no maxillary sinus tenderness and no frontal sinus tenderness. Left sinus exhibits no maxillary sinus tenderness and no frontal sinus tenderness.  Mouth/Throat: Oropharynx is clear and moist and mucous membranes are normal. Mucous membranes are not pale, not dry and not cyanotic. No oral lesions. No uvula swelling. No oropharyngeal exudate, posterior oropharyngeal edema, posterior oropharyngeal erythema or tonsillar abscesses.  Eyes: Conjunctivae normal are normal. Right eye exhibits no discharge. Left eye exhibits no discharge. No scleral icterus.  Neck: Normal range of motion. Neck supple. No JVD present. No tracheal deviation present. No thyromegaly present.  Cardiovascular: Normal rate, regular rhythm, normal heart sounds and intact distal pulses.  Exam reveals no gallop and no friction rub.   No murmur heard. Pulmonary/Chest: Effort normal and breath sounds normal. No stridor. No respiratory distress. She has no wheezes. She has no rales. She exhibits no tenderness.  Abdominal: Soft. Bowel sounds are normal. She exhibits no distension and no mass. There is no tenderness. There is no rebound and no guarding.  Musculoskeletal: Normal range of motion. She exhibits no edema and no tenderness.  Lymphadenopathy:    She has no cervical adenopathy.  Neurological: She is oriented to person, place, and time.  Skin: Skin is warm and dry. No rash noted. She is not diaphoretic. No erythema. No pallor.  Psychiatric: She has a normal mood and affect. Her behavior is normal. Judgment and thought content  normal.     Lab Results  Component Value Date   WBC 4.9 09/23/2009   HGB 14.0 09/23/2009  HCT 42.1 09/23/2009   PLT 196.0 09/23/2009   GLUCOSE 89 09/23/2009   CHOL 131 09/23/2009   TRIG 106.0 09/23/2009   HDL 48.40 09/23/2009   LDLDIRECT 166.7 06/05/2008   LDLCALC 61 09/23/2009   ALT 33 09/23/2009   AST 24 09/23/2009   NA 143 09/23/2009   K 3.8 09/23/2009   CL 106 09/23/2009   CREATININE 0.7 09/23/2009   BUN 17 09/23/2009   CO2 32 09/23/2009   TSH 1.01 09/23/2009   HGBA1C 5.6 09/23/2009       Assessment & Plan:

## 2012-07-27 NOTE — Assessment & Plan Note (Signed)
She will restart and be more compliant with metoprolol, also will start Benicar-HCT I will check her labs today to look for secondary causes and end organ damage She will stop taking nsaids

## 2012-07-27 NOTE — Patient Instructions (Signed)

## 2012-08-21 ENCOUNTER — Encounter: Payer: Self-pay | Admitting: Internal Medicine

## 2012-08-21 ENCOUNTER — Ambulatory Visit (INDEPENDENT_AMBULATORY_CARE_PROVIDER_SITE_OTHER): Payer: BC Managed Care – PPO | Admitting: Internal Medicine

## 2012-08-21 VITALS — BP 126/78 | HR 76 | Temp 98.4°F | Resp 10 | Wt 158.0 lb

## 2012-08-21 DIAGNOSIS — I10 Essential (primary) hypertension: Secondary | ICD-10-CM

## 2012-08-21 MED ORDER — LOSARTAN POTASSIUM-HCTZ 50-12.5 MG PO TABS: 1.0000 | ORAL_TABLET | Freq: Every day | ORAL | Status: DC

## 2012-08-22 NOTE — Progress Notes (Signed)
  Subjective:    Patient ID: Kimberly Cox, female    DOB: 1945-12-29, 66 y.o.   MRN: [id number]  HPI Kimberly Cox presents for follow up of BP . She was in to see Dr. Yetta Barre for epistaxis and her BP was 170/100. Benicar/Hct was added to her regimen. She has done well with this: no adverse effects and her BP is much better controlled. She has not had further epistaxis  PMH, FamHx and SocHx reviewed for any changes and relevance. Current Outpatient Prescriptions on File Prior to Visit  Medication Sig Dispense Refill  . aspirin 81 MG tablet Take 81 mg by mouth daily.        . Biotin 1000 MCG tablet Take 1,000 mcg by mouth 3 (three) times daily.        . Calcium Carbonate-Vitamin D (CALTRATE 600+D) 600-400 MG-UNIT per tablet Take 1 tablet by mouth daily.        . Coenzyme Q10 (CO Q 10 PO) Take by mouth daily.      Marland Kitchen FOLIC ACID PO Take by mouth daily.      . Lactobacillus (ACIDOPHILUS) 10 MG CAPS Take by mouth.        . metoprolol (LOPRESSOR) 50 MG tablet TAKE 1 TABLET TWICE DAILY  60 tablet  5  . Multiple Vitamin (MULTIVITAMIN) tablet Take 1 tablet by mouth daily.      . simvastatin (ZOCOR) 40 MG tablet TAKE 1 TABLET BY MOUTH IN THE EVENING  30 tablet  2  . itraconazole (SPORANOX) 100 MG capsule Take 100 mg by mouth as directed.      Marland Kitchen losartan-hydrochlorothiazide (HYZAAR) 50-12.5 MG per tablet Take 1 tablet by mouth daily.  90 tablet  3  . terbinafine (LAMISIL) 250 MG tablet Take 250 mg by mouth daily.          Review of Systems System review is negative for any constitutional, cardiac, pulmonary, GI or neuro symptoms or complaints other than as described in the HPI.     Objective:   Physical Exam Filed Vitals:   08/21/12 1259  BP: 126/78  Pulse: 76  Temp: 98.4 F (36.9 C)  Resp: 10   BP Readings from Last 3 Encounters:  08/21/12 126/78  07/27/12 176/100  03/14/11 138/82   Gen'l- WNWD white woman in no distress HEENT - C&S clear Cor - RRR Pulm 0- normal respirations Neuro  - A&O x 3       Assessment & Plan:

## 2012-08-22 NOTE — Assessment & Plan Note (Signed)
BP Readings from Last 3 Encounters:  08/21/12 126/78  07/27/12 176/100  03/14/11 138/82   Blood pressure today is very well controlled. She is concerned about $$  Plan Continue BB  Change to losartan/hct 50/12.5  Follow BP at home.

## 2012-10-24 ENCOUNTER — Other Ambulatory Visit: Payer: Self-pay | Admitting: *Deleted

## 2012-10-24 MED ORDER — SIMVASTATIN 40 MG PO TABS: 40.0000 mg | ORAL_TABLET | Freq: Every day | ORAL | Status: DC

## 2012-11-20 ENCOUNTER — Telehealth: Payer: Self-pay | Admitting: Internal Medicine

## 2012-11-20 NOTE — Telephone Encounter (Signed)
Patient Information:  Caller Name: Jonel  Phone: [phone]  Patient: Kimberly Cox, Kimberly Cox  Gender: Female  DOB: June 23, 1946  Age: 67 Years  PCP: Illene Regulus (Adults only)  Office Follow Up:  Does the office need to follow up with this patient?: Yes  Instructions For The Office: Please contact her about her blood pressure medication.  She has stopped metoprolol due to low blood pressure  RN Note:  Please have Dr. Debby Bud review reduction of the medication on her own. Advise patient on what she should do?  please contact - [phone]  Symptoms  Reason For Call & Symptoms: Patient states she was in office in Nov and December 2013, for HTN. She was placed Benicar HCT which helped . She was given a Rx -Losartin HCTZ (generic) 50mg /12.5mg  Presciprtion.  She has been taking the Losartin since December. Metoprol is taken at night as well 50mg  at h.s..  Her blood pressure returned to normal  She spot checked her B/P on March 10th.  readings 108/62 and 112/66.she is experincing "weakness".  She states she has a friend who is in the medical field. She decided to skip the Metoprolol.  Since March 10th she has been only on losartin-HCTZ.  Pressures have been ranging 116/70- 134/79.  Mointoring her pressures tid. She feels better since stopping the metoprolol.   Reviewed Health History In EMR: Yes  Reviewed Medications In EMR: Yes  Reviewed Allergies In EMR: Yes  Reviewed Surgeries / Procedures: No  Date of Onset of Symptoms: 11/12/2012  Treatments Tried: Metoprolol 50mg  at night (stopped on 3/10)  , Losartin-HCTZ during the day  Treatments Tried Worked: No  Guideline(s) Used:  High Blood Pressure  Disposition Per Guideline:   Home Care  Reason For Disposition Reached:   BP < 140 /90 and taking BP medications  Advice Given:  General:  The goal of blood pressure treatment for most patients with hypertension is to keep the blood pressure under 140/90.  BP 120-139 / 80-89   This is considered  borderline high blood pressure, or "prehypertension".  Sometimes, changes in your lifestyle can reduce your blood pressure without medications.  If your blood pressure stays elevated during the next month, you should go in to see the doctor and get your blood pressure checked.  Lifestyle Changes  Maintain a healthy weight. Lose weight if you are overweight.  Do 30 minutes of aerobic physical activity (e.g., brisk walking) most days of the week.  Eat a diet high in fresh fruits and low-fat dairy products. Limit your intake of saturated and total fat. Choose foods that are lower in salt.  If you smoke, you should stop.  If you drink alcohol, you should limit your daily alcohol drinking. Women should have no more than one drink per day. Men should have no more than 2 drinks per day. A drink is defined as 1.5 oz hard liquor (one shot or jigger; 45 ml), 5 oz wine (small glass; 150 ml), or 12 oz beer (one can; 360 ml).  Call Back If:  Headache, blurred vision, difficulty talking, or difficulty walking occurs  Chest pain or difficulty breathing occurs  You become worse.  Patient Will Follow Care Advice:  YES

## 2012-11-20 NOTE — Telephone Encounter (Signed)
BP control sounds great. OK to leave off the Metoprolol.

## 2012-11-21 NOTE — Telephone Encounter (Signed)
Pt informed of MD's advisement regarding Metoprolol.

## 2012-11-26 ENCOUNTER — Encounter: Payer: Self-pay | Admitting: Internal Medicine

## 2013-02-25 ENCOUNTER — Telehealth: Payer: Self-pay

## 2013-02-25 NOTE — Telephone Encounter (Signed)
Dr. Jethro Bolus Dr. Jerolyn Center Dr. Charlotte Sanes

## 2013-02-25 NOTE — Telephone Encounter (Signed)
Phone call from patient requesting the names of at least 2 ophthalmologists to choose from. Please advise. Thanks.

## 2013-02-25 NOTE — Telephone Encounter (Signed)
Patient notified

## 2013-04-10 ENCOUNTER — Other Ambulatory Visit: Payer: Self-pay

## 2013-06-17 ENCOUNTER — Other Ambulatory Visit (HOSPITAL_COMMUNITY): Payer: Self-pay | Admitting: Obstetrics & Gynecology

## 2013-06-17 DIAGNOSIS — Z1231 Encounter for screening mammogram for malignant neoplasm of breast: Secondary | ICD-10-CM

## 2013-06-20 ENCOUNTER — Other Ambulatory Visit (INDEPENDENT_AMBULATORY_CARE_PROVIDER_SITE_OTHER): Payer: BC Managed Care – PPO

## 2013-06-20 ENCOUNTER — Encounter: Payer: Self-pay | Admitting: Internal Medicine

## 2013-06-20 ENCOUNTER — Ambulatory Visit (INDEPENDENT_AMBULATORY_CARE_PROVIDER_SITE_OTHER): Payer: BC Managed Care – PPO | Admitting: Internal Medicine

## 2013-06-20 VITALS — BP 150/80 | HR 71 | Temp 97.0°F | Wt 153.0 lb

## 2013-06-20 DIAGNOSIS — E785 Hyperlipidemia, unspecified: Secondary | ICD-10-CM

## 2013-06-20 DIAGNOSIS — G609 Hereditary and idiopathic neuropathy, unspecified: Secondary | ICD-10-CM

## 2013-06-20 DIAGNOSIS — R209 Unspecified disturbances of skin sensation: Secondary | ICD-10-CM

## 2013-06-20 DIAGNOSIS — M67432 Ganglion, left wrist: Secondary | ICD-10-CM

## 2013-06-20 DIAGNOSIS — I10 Essential (primary) hypertension: Secondary | ICD-10-CM

## 2013-06-20 DIAGNOSIS — M6740 Ganglion, unspecified site: Secondary | ICD-10-CM

## 2013-06-20 DIAGNOSIS — B880 Other acariasis: Secondary | ICD-10-CM

## 2013-06-20 LAB — COMPREHENSIVE METABOLIC PANEL
AST: 24 U/L (ref 0–37)
Albumin: 4.5 g/dL (ref 3.5–5.2)
BUN: 28 mg/dL — ABNORMAL HIGH (ref 6–23)
CO2: 32 mEq/L (ref 19–32)
Calcium: 9.7 mg/dL (ref 8.4–10.5)
Creatinine, Ser: 0.8 mg/dL (ref 0.4–1.2)
GFR: 72.21 mL/min (ref >60.00)
Glucose, Bld: 96 mg/dL (ref 70–99)
Potassium: 3.7 mEq/L (ref 3.5–5.1)
Sodium: 143 mEq/L (ref 135–145)
Total Protein: 7 g/dL (ref 6.0–8.3)

## 2013-06-20 LAB — LIPID PANEL
Cholesterol: 124 mg/dL (ref 0–200)
HDL: 45.7 mg/dL (ref >39.00)
LDL Cholesterol: 53 mg/dL (ref 0–99)
Triglycerides: 126 mg/dL (ref 0.0–149.0)
VLDL: 25.2 mg/dL (ref 0.0–40.0)

## 2013-06-20 LAB — VITAMIN B12: Vitamin B-12: 588 pg/mL (ref 211–911)

## 2013-06-20 MED ORDER — BETAMETHASONE DIPROPIONATE 0.05 % EX CREA: TOPICAL_CREAM | Freq: Two times a day (BID) | CUTANEOUS | Status: DC

## 2013-06-20 NOTE — Progress Notes (Signed)
Subjective:    Patient ID: Kimberly Cox, female    DOB: Apr 13, 1946, 67 y.o.   MRN: [id number]  HPI Progressive paresthesia in the toes.  Cyst palmer aspect left wrist - no limitation in function. Small red lesions distal legs with whitish center. Looks like Holiday representative dermal cyst just below the left knee For colonoscopy.  Past Medical History  Diagnosis Date  . Anxiety disorder   . Plantar fasciitis   . History of chest pain   . Hyperlipidemia   . Hypertension   . Concussion Dec 13th, 2011    fell, striking her head against a wall, developed dizziness - UC eval c/w concussion. Had follow-up and did ok.   . Anxiety    Past Surgical History  Procedure Laterality Date  . Caesarean section    . Tonsillectomy    . Cesarean section     Family History  Problem Relation Age of Onset  . Cancer Mother   . Coronary artery disease Father   . Breast cancer Neg Hx   . Colon cancer Neg Hx    History   Social History  . Marital Status: Married    Spouse Name: N/A    Number of Children: N/A  . Years of Education: N/A   Occupational History  . Not on file.   Social History Main Topics  . Smoking status: Former Games developer  . Smokeless tobacco: Never Used     Comment: only in h.s.  . Alcohol Use: No  . Drug Use: No  . Sexual Activity: Yes    Birth Control/ Protection: Post-menopausal   Other Topics Concern  . Not on file   Social History Narrative   HSG, community college   Married '68   1 daughter- '80, 1 son- '77; two grandchildren   Work: was a Diplomatic Services operational officer, full time homemaker   Marriage in good health   End of care: no cpr, no mechanical ventalation, no futile or heroic measures.    Current Outpatient Prescriptions on File Prior to Visit  Medication Sig Dispense Refill  . aspirin 81 MG tablet Take 81 mg by mouth daily.        . Biotin 1000 MCG tablet Take 1,000 mcg by mouth 3 (three) times daily.        . Calcium Carbonate-Vitamin D (CALTRATE 600+D) 600-400  MG-UNIT per tablet Take 1 tablet by mouth daily.        . Coenzyme Q10 (CO Q 10 PO) Take by mouth daily.      Marland Kitchen FOLIC ACID PO Take by mouth daily.      . Lactobacillus (ACIDOPHILUS) 10 MG CAPS Take by mouth.        . losartan-hydrochlorothiazide (HYZAAR) 50-12.5 MG per tablet Take 1 tablet by mouth daily.  90 tablet  3  . meloxicam (MOBIC) 15 MG tablet       . Multiple Vitamin (MULTIVITAMIN) tablet Take 1 tablet by mouth daily.      . simvastatin (ZOCOR) 40 MG tablet Take 1 tablet (40 mg total) by mouth at bedtime.  90 tablet  2   No current facility-administered medications on file prior to visit.      Review of Systems System review is negative for any constitutional, cardiac, pulmonary, GI or neuro symptoms or complaints other than as described in the HPI.     Objective:   Physical Exam Filed Vitals:   06/20/13 1109  BP: 150/80  Pulse: 71  Temp: 97 F (36.1 C)  Gen'l- WNWD woman in no distress Cor- RRR Pulm - normal respirations Derm - small cyst at the left wrist that is firm, non-tender, relatively fixed. Distal LE with 1-109mm red pin point lesions       Assessment & Plan:  1. Pin-point rash on the legs - looks like chiggers. Plan Wash with soap and water  Apply steroid cream twice a day  If no resolution let me know and will refer to dermatology  2. Cyst on wrist - looks like a ganglion cyst. As long as it doesn't bother you it is ok to watch and wait.

## 2013-06-20 NOTE — Patient Instructions (Signed)
Good to see you.  1. Pin-point rash on the legs - looks like chiggers. Plan Wash with soap and water  Apply steroid cream twice a day  If no resolution let me know and will refer to dermatology  2. Blood pressure - a little high today - please send me some readings from home using MyChart  3. Tingling in the toes that is getting worse. Had a nerve conduction study in June '09 that was normal Plan Lab: B12 level  Repeat nerve conduction study - staff will schedule  4. Cyst on wrist - looks like a ganglion cyst. As long as it doesn't bother you it is ok to watch and wait.

## 2013-06-23 NOTE — Assessment & Plan Note (Signed)
Tingling in the toes that is getting worse. Had a nerve conduction study in June '09 that was normal Plan Lab: B12 level  Repeat nerve conduction study - staff will schedule

## 2013-06-23 NOTE — Assessment & Plan Note (Signed)
BP Readings from Last 3 Encounters:  06/20/13 150/80  08/21/12 126/78  07/27/12 176/100   Blood pressure - a little high today - please send me some readings from home using MyChart

## 2013-06-26 ENCOUNTER — Other Ambulatory Visit: Payer: Self-pay | Admitting: Internal Medicine

## 2013-06-26 DIAGNOSIS — R202 Paresthesia of skin: Secondary | ICD-10-CM

## 2013-07-03 ENCOUNTER — Ambulatory Visit (HOSPITAL_COMMUNITY)
Admission: RE | Admit: 2013-07-03 | Discharge: 2013-07-03 | Disposition: A | Discharge status: Home / Self Care | Payer: BC Managed Care – PPO | Source: Ambulatory Visit | Attending: Obstetrics & Gynecology | Admitting: Obstetrics & Gynecology

## 2013-07-03 DIAGNOSIS — Z1231 Encounter for screening mammogram for malignant neoplasm of breast: Secondary | ICD-10-CM | POA: Insufficient documentation

## 2013-07-04 ENCOUNTER — Ambulatory Visit: Payer: Self-pay

## 2013-07-11 ENCOUNTER — Other Ambulatory Visit: Payer: Self-pay

## 2013-07-15 ENCOUNTER — Ambulatory Visit (INDEPENDENT_AMBULATORY_CARE_PROVIDER_SITE_OTHER): Payer: BC Managed Care – PPO

## 2013-07-15 DIAGNOSIS — R209 Unspecified disturbances of skin sensation: Secondary | ICD-10-CM

## 2013-07-15 DIAGNOSIS — G609 Hereditary and idiopathic neuropathy, unspecified: Secondary | ICD-10-CM

## 2013-07-16 NOTE — Procedures (Signed)
  HISTORY:  Kimberly Cox is a 67 year old patient with a history of paresthesias involving the toes. The patient is being evaluated for a possible peripheral neuropathy.  NERVE CONDUCTION STUDIES:  Nerve conduction studies were performed on both lower extremities. The distal motor latencies and motor amplitudes for the peroneal and posterior tibial nerves were within normal limits. The nerve conduction velocities for these nerves were also normal. The H reflex latencies were normal. The sensory latencies for the peroneal nerves were within normal limits.   EMG STUDIES:  EMG evaluation of the lower extremities was not performed.  IMPRESSION:  Nerve conduction studies done on both lower extremities were unremarkable. There is no clear evidence of a peripheral neuropathy. An early peripheral neuropathy, or a small fiber neuropathy may be missed by standard nerve conduction studies, however. Clinical correlation is required.   Marlan Palau MD 07/16/2013 7:52 AM  [facility] [address] [place], [place] [address]  Phone [phone] Fax [phone]

## 2013-07-18 ENCOUNTER — Ambulatory Visit: Payer: Self-pay

## 2013-07-19 ENCOUNTER — Other Ambulatory Visit: Payer: Self-pay | Admitting: Internal Medicine

## 2013-09-23 ENCOUNTER — Other Ambulatory Visit: Payer: Self-pay | Admitting: Internal Medicine

## 2013-12-18 ENCOUNTER — Encounter: Payer: Self-pay | Admitting: Internal Medicine

## 2013-12-23 ENCOUNTER — Telehealth: Payer: Self-pay | Admitting: Internal Medicine

## 2013-12-23 NOTE — Telephone Encounter (Signed)
Spoke with patient and she had great difficulty with the prep for colonoscopy last time. She states she does not even like water. Discussed that there are options. She is very anxious about the prep. Scheduled OV on 4./28/15 at 10:00 AM to discuss with Dr. Juanda ChanceBrodie.

## 2013-12-31 ENCOUNTER — Ambulatory Visit (INDEPENDENT_AMBULATORY_CARE_PROVIDER_SITE_OTHER): Payer: BC Managed Care – PPO | Admitting: Internal Medicine

## 2013-12-31 ENCOUNTER — Encounter: Payer: Self-pay | Admitting: Internal Medicine

## 2013-12-31 VITALS — BP 138/88 | HR 74 | Ht 66.0 in | Wt 156.6 lb

## 2013-12-31 DIAGNOSIS — Z1211 Encounter for screening for malignant neoplasm of colon: Secondary | ICD-10-CM

## 2013-12-31 NOTE — Patient Instructions (Addendum)
You have been scheduled for a colonoscopy with propofol. Please follow written instructions given to you at your visit today.  Please pick up your prep kit at the pharmacy within the next 1-3 days. If you use inhalers (even only as needed), please bring them with you on the day of your procedure. Your physician has requested that you go to www.startemmi.com and enter the access code given to you at your visit today. This web site gives a general overview about your procedure. However, you should still follow specific instructions given to you by our office regarding your preparation for the procedure. Dr Linda Hedges

## 2013-12-31 NOTE — Progress Notes (Signed)
Kimberly Cox [birth year] [id number]  Note: This dictation was prepared with Dragon digital system. Any transcriptional errors that result from this procedure are unintentional.   History of Present Illness:  This is a 68 year old white female who is here to discuss having a screening colonoscopy. Her last exam was in April 2004 and it was a normal to the cecum. There is no family history of colon cancer. Patient has regular bowel habits. She denies rectal bleeding or abdominal pain. She is here to discuss prep. She was very sick with the last prep which was Golytely. She says that she drinks very little fluids during the day and is unable to drink large volumes. I have suggested Prepopik which includes 2 five oz glasses of prep solution and 8 glasses of clear liquid of her choice in 2 divided doses.    Past Medical History  Diagnosis Date  . Anxiety disorder   . Plantar fasciitis   . History of chest pain   . Hyperlipidemia   . Hypertension   . Concussion Dec 13th, 2011    fell, striking her head against a wall, developed dizziness - UC eval c/w concussion. Had follow-up and did ok.   . Anxiety     Past Surgical History  Procedure Laterality Date  . Caesarean section    . Tonsillectomy    . Cesarean section      No Known Allergies  Family history and social history have been reviewed.  Review of Systems: Denies abdominal pain nausea vomiting weight loss  The remainder of the 10 point ROS is negative except as outlined in the H&P  Physical Exam: General Appearance Well developed, in no distress Psychological Normal mood and affect  Assessment and Plan:   Problem #141 68 year old white female who is due for a recall colonoscopy. She has no risk factors. Her last exam was in April 2004. She had problems with a large volume prep on her last exam. We will give her Prepopik as the prep for her upcoming procedure which will be scheduled today. I have discussed sedation as well as  the procedure itself.    Kimberly Cox 12/31/2013

## 2014-01-01 ENCOUNTER — Encounter: Payer: Self-pay | Admitting: Internal Medicine

## 2014-01-10 ENCOUNTER — Encounter: Payer: Self-pay | Admitting: Internal Medicine

## 2014-02-18 ENCOUNTER — Encounter: Payer: BC Managed Care – PPO | Admitting: Internal Medicine

## 2014-04-18 ENCOUNTER — Other Ambulatory Visit: Payer: Self-pay | Admitting: *Deleted

## 2014-04-18 MED ORDER — SIMVASTATIN 40 MG PO TABS: ORAL_TABLET | ORAL | Status: DC

## 2014-06-02 ENCOUNTER — Other Ambulatory Visit (HOSPITAL_COMMUNITY): Payer: Self-pay | Admitting: Obstetrics & Gynecology

## 2014-06-02 DIAGNOSIS — Z1231 Encounter for screening mammogram for malignant neoplasm of breast: Secondary | ICD-10-CM

## 2014-06-09 ENCOUNTER — Ambulatory Visit: Payer: BC Managed Care – PPO | Admitting: Internal Medicine

## 2014-07-04 ENCOUNTER — Ambulatory Visit (HOSPITAL_COMMUNITY): Payer: BC Managed Care – PPO

## 2014-07-10 ENCOUNTER — Ambulatory Visit (HOSPITAL_COMMUNITY): Payer: BC Managed Care – PPO

## 2014-07-17 ENCOUNTER — Ambulatory Visit (INDEPENDENT_AMBULATORY_CARE_PROVIDER_SITE_OTHER): Payer: BC Managed Care – PPO | Admitting: Internal Medicine

## 2014-07-17 ENCOUNTER — Ambulatory Visit (INDEPENDENT_AMBULATORY_CARE_PROVIDER_SITE_OTHER): Payer: BC Managed Care – PPO | Admitting: Geriatric Medicine

## 2014-07-17 ENCOUNTER — Encounter: Payer: Self-pay | Admitting: Internal Medicine

## 2014-07-17 VITALS — BP 118/82 | HR 76 | Temp 98.2°F | Resp 12 | Ht 66.0 in | Wt 150.0 lb

## 2014-07-17 DIAGNOSIS — Z23 Encounter for immunization: Secondary | ICD-10-CM

## 2014-07-17 DIAGNOSIS — Z299 Encounter for prophylactic measures, unspecified: Secondary | ICD-10-CM

## 2014-07-17 DIAGNOSIS — Z0000 Encounter for general adult medical examination without abnormal findings: Secondary | ICD-10-CM

## 2014-07-17 DIAGNOSIS — G609 Hereditary and idiopathic neuropathy, unspecified: Secondary | ICD-10-CM

## 2014-07-17 DIAGNOSIS — Z418 Encounter for other procedures for purposes other than remedying health state: Secondary | ICD-10-CM

## 2014-07-17 DIAGNOSIS — E785 Hyperlipidemia, unspecified: Secondary | ICD-10-CM

## 2014-07-17 DIAGNOSIS — I10 Essential (primary) hypertension: Secondary | ICD-10-CM

## 2014-07-17 NOTE — Progress Notes (Signed)
Pre visit review using our clinic review tool, if applicable. No additional management support is needed unless otherwise documented below in the visit note. 

## 2014-07-17 NOTE — Patient Instructions (Signed)
We will have you come back for your bloodwork when you're fasting. We have given you the pneumonia Prevnar, and the flu shot.  Come back in about a year. If you notice that your weight is still decreasing and is not steady come back sooner for a visit.  Work on exercising 3-4 times weekly to keep yourself healthy.  Health Maintenance Adopting a healthy lifestyle and getting preventive care can go a long way to promote health and wellness. Talk with your health care provider about what schedule of regular examinations is right for you. This is a good chance for you to check in with your provider about disease prevention and staying healthy. In between checkups, there are plenty of things you can do on your own. Experts have done a lot of research about which lifestyle changes and preventive measures are most likely to keep you healthy. Ask your health care provider for more information. WEIGHT AND DIET  Eat a healthy diet  Be sure to include plenty of vegetables, fruits, low-fat dairy products, and lean protein.  Do not eat a lot of foods high in solid fats, added sugars, or salt.  Get regular exercise. This is one of the most important things you can do for your health.  Most adults should exercise for at least 150 minutes each week. The exercise should increase your heart rate and make you sweat (moderate-intensity exercise).  Most adults should also do strengthening exercises at least twice a week. This is in addition to the moderate-intensity exercise.  Maintain a healthy weight  Body mass index (BMI) is a measurement that can be used to identify possible weight problems. It estimates body fat based on height and weight. Your health care provider can help determine your BMI and help you achieve or maintain a healthy weight.  For females 82 years of age and older:   A BMI below 18.5 is considered underweight.  A BMI of 18.5 to 24.9 is normal.  A BMI of 25 to 29.9 is considered  overweight.  A BMI of 30 and above is considered obese.  Watch levels of cholesterol and blood lipids  You should start having your blood tested for lipids and cholesterol at 66 years of age, then have this test every 5 years.  You may need to have your cholesterol levels checked more often if:  Your lipid or cholesterol levels are high.  You are older than 66 years of age.  You are at high risk for heart disease.  CANCER SCREENING   Lung Cancer  Lung cancer screening is recommended for adults 2-26 years old who are at high risk for lung cancer because of a history of smoking.  A yearly low-dose CT scan of the lungs is recommended for people who:  Currently smoke.  Have quit within the past 15 years.  Have at least a 30-pack-year history of smoking. A pack year is smoking an average of one pack of cigarettes a day for 1 year.  Yearly screening should continue until it has been 15 years since you quit.  Yearly screening should stop if you develop a health problem that would prevent you from having lung cancer treatment.  Breast Cancer  Practice breast self-awareness. This means understanding how your breasts normally appear and feel.  It also means doing regular breast self-exams. Let your health care provider know about any changes, no matter how small.  If you are in your 20s or 30s, you should have a clinical breast  exam (CBE) by a health care provider every 1-3 years as part of a regular health exam.  If you are 78 or older, have a CBE every year. Also consider having a breast X-ray (mammogram) every year.  If you have a family history of breast cancer, talk to your health care provider about genetic screening.  If you are at high risk for breast cancer, talk to your health care provider about having an MRI and a mammogram every year.  Breast cancer gene (BRCA) assessment is recommended for women who have family members with BRCA-related cancers. BRCA-related  cancers include:  Breast.  Ovarian.  Tubal.  Peritoneal cancers.  Results of the assessment will determine the need for genetic counseling and BRCA1 and BRCA2 testing. Cervical Cancer Routine pelvic examinations to screen for cervical cancer are no longer recommended for nonpregnant women who are considered low risk for cancer of the pelvic organs (ovaries, uterus, and vagina) and who do not have symptoms. A pelvic examination may be necessary if you have symptoms including those associated with pelvic infections. Ask your health care provider if a screening pelvic exam is right for you.   The Pap test is the screening test for cervical cancer for women who are considered at risk.  If you had a hysterectomy for a problem that was not cancer or a condition that could lead to cancer, then you no longer need Pap tests.  If you are older than 65 years, and you have had normal Pap tests for the past 10 years, you no longer need to have Pap tests.  If you have had past treatment for cervical cancer or a condition that could lead to cancer, you need Pap tests and screening for cancer for at least 20 years after your treatment.  If you no longer get a Pap test, assess your risk factors if they change (such as having a new sexual partner). This can affect whether you should start being screened again.  Some women have medical problems that increase their chance of getting cervical cancer. If this is the case for you, your health care provider may recommend more frequent screening and Pap tests.  The human papillomavirus (HPV) test is another test that may be used for cervical cancer screening. The HPV test looks for the virus that can cause cell changes in the cervix. The cells collected during the Pap test can be tested for HPV.  The HPV test can be used to screen women 62 years of age and older. Getting tested for HPV can extend the interval between normal Pap tests from three to five  years.  An HPV test also should be used to screen women of any age who have unclear Pap test results.  After 66 years of age, women should have HPV testing as often as Pap tests.  Colorectal Cancer  This type of cancer can be detected and often prevented.  Routine colorectal cancer screening usually begins at 66 years of age and continues through 66 years of age.  Your health care provider may recommend screening at an earlier age if you have risk factors for colon cancer.  Your health care provider may also recommend using home test kits to check for hidden blood in the stool.  A small camera at the end of a tube can be used to examine your colon directly (sigmoidoscopy or colonoscopy). This is done to check for the earliest forms of colorectal cancer.  Routine screening usually begins at age 36.  Direct examination of the colon should be repeated every 5-10 years through 66 years of age. However, you may need to be screened more often if early forms of precancerous polyps or small growths are found. Skin Cancer  Check your skin from head to toe regularly.  Tell your health care provider about any new moles or changes in moles, especially if there is a change in a mole's shape or color.  Also tell your health care provider if you have a mole that is larger than the size of a pencil eraser.  Always use sunscreen. Apply sunscreen liberally and repeatedly throughout the day.  Protect yourself by wearing long sleeves, pants, a wide-brimmed hat, and sunglasses whenever you are outside. HEART DISEASE, DIABETES, AND HIGH BLOOD PRESSURE   Have your blood pressure checked at least every 1-2 years. High blood pressure causes heart disease and increases the risk of stroke.  If you are between 55 years and 79 years old, ask your health care provider if you should take aspirin to prevent strokes.  Have regular diabetes screenings. This involves taking a blood sample to check your fasting  blood sugar level.  If you are at a normal weight and have a low risk for diabetes, have this test once every three years after 66 years of age.  If you are overweight and have a high risk for diabetes, consider being tested at a younger age or more often. PREVENTING INFECTION  Hepatitis B  If you have a higher risk for hepatitis B, you should be screened for this virus. You are considered at high risk for hepatitis B if:  You were born in a country where hepatitis B is common. Ask your health care provider which countries are considered high risk.  Your parents were born in a high-risk country, and you have not been immunized against hepatitis B (hepatitis B vaccine).  You have HIV or AIDS.  You use needles to inject street drugs.  You live with someone who has hepatitis B.  You have had sex with someone who has hepatitis B.  You get hemodialysis treatment.  You take certain medicines for conditions, including cancer, organ transplantation, and autoimmune conditions. Hepatitis C  Blood testing is recommended for:  Everyone born from 1945 through 1965.  Anyone with known risk factors for hepatitis C. Sexually transmitted infections (STIs)  You should be screened for sexually transmitted infections (STIs) including gonorrhea and chlamydia if:  You are sexually active and are younger than 66 years of age.  You are older than 66 years of age and your health care provider tells you that you are at risk for this type of infection.  Your sexual activity has changed since you were last screened and you are at an increased risk for chlamydia or gonorrhea. Ask your health care provider if you are at risk.  If you do not have HIV, but are at risk, it may be recommended that you take a prescription medicine daily to prevent HIV infection. This is called pre-exposure prophylaxis (PrEP). You are considered at risk if:  You are sexually active and do not regularly use condoms or know  the HIV status of your partner(s).  You take drugs by injection.  You are sexually active with a partner who has HIV. Talk with your health care provider about whether you are at high risk of being infected with HIV. If you choose to begin PrEP, you should first be tested for HIV. You should then be   tested every 3 months for as long as you are taking PrEP.  PREGNANCY   If you are premenopausal and you may become pregnant, ask your health care provider about preconception counseling.  If you may become pregnant, take 400 to 800 micrograms (mcg) of folic acid every day.  If you want to prevent pregnancy, talk to your health care provider about birth control (contraception). OSTEOPOROSIS AND MENOPAUSE   Osteoporosis is a disease in which the bones lose minerals and strength with aging. This can result in serious bone fractures. Your risk for osteoporosis can be identified using a bone density scan.  If you are 65 years of age or older, or if you are at risk for osteoporosis and fractures, ask your health care provider if you should be screened.  Ask your health care provider whether you should take a calcium or vitamin D supplement to lower your risk for osteoporosis.  Menopause may have certain physical symptoms and risks.  Hormone replacement therapy may reduce some of these symptoms and risks. Talk to your health care provider about whether hormone replacement therapy is right for you.  HOME CARE INSTRUCTIONS   Schedule regular health, dental, and eye exams.  Stay current with your immunizations.   Do not use any tobacco products including cigarettes, chewing tobacco, or electronic cigarettes.  If you are pregnant, do not drink alcohol.  If you are breastfeeding, limit how much and how often you drink alcohol.  Limit alcohol intake to no more than 1 drink per day for nonpregnant women. One drink equals 12 ounces of beer, 5 ounces of wine, or 1 ounces of hard liquor.  Do not  use street drugs.  Do not share needles.  Ask your health care provider for help if you need support or information about quitting drugs.  Tell your health care provider if you often feel depressed.  Tell your health care provider if you have ever been abused or do not feel safe at home. Document Released: 03/07/2011 Document Revised: 01/06/2014 Document Reviewed: 07/24/2013 [employer] Patient Information 2015 [employer]. This information is not intended to replace advice given to you by your health care provider. Make sure you discuss any questions you have with your health care provider.  

## 2014-07-18 ENCOUNTER — Other Ambulatory Visit: Payer: Self-pay | Admitting: Internal Medicine

## 2014-07-18 DIAGNOSIS — Z0000 Encounter for general adult medical examination without abnormal findings: Secondary | ICD-10-CM | POA: Insufficient documentation

## 2014-07-18 NOTE — Assessment & Plan Note (Addendum)
She does get occasional episodes of numbness in her feet. She has had an EMG done of her feet without any evidence of nerve damage. She does not have diabetes. She does not need any medication for this problem at this time. Patient has not had recent vitamin B-12 level which can cause peripheral neuropathy. Will check vitamin B-12 level today.

## 2014-07-18 NOTE — Assessment & Plan Note (Signed)
Continue simvastatin. Check a lipid panel and adjust dosing as necessary.

## 2014-07-18 NOTE — Progress Notes (Signed)
   Subjective:    Patient ID: Kimberly Cox, female    DOB: August 14, 1946, 68 y.o.   MRN: [id number]  HPI The patient is a 89 -year-old female here today to establish care. His past medical history of shoulder pain, peripheral neuropathy, insomnia, hypertension, hyperlipidemia. She is not having any new problems at this time. She still has the sensation that her toes are occasionally going numb. She has had this worked up several times without being able to find a good cause. She continues to take her blood pressure medication and cholesterol medication without any side effects. She denies any chest pain, shortness of breath, abdominal pain, new symptoms, dizziness, balance change, fall. She denies any signs of depression including sleep change, guilt, low mood, anxiety. Here for medicare wellness  Diet: heart healthy or DM if diabetic Physical activity: sedentary Depression/mood screen: negative Hearing: intact to whispered voice Visual acuity: grossly normal, performs annual eye exam  ADLs: capable Fall risk: none Home safety: good Cognitive evaluation: intact to orientation, naming, recall and repetition EOL planning: adv directives, full code/ I agree  I have personally reviewed and have noted 1. The patient's medical and social history 2. Their use of alcohol, tobacco or illicit drugs 3. Their current medications and supplements 4. The patient's functional ability including ADL's, fall risks, home safety risks and hearing or visual impairment. 5. Diet and physical activities 6. Evidence for depression or mood disorders 7. The current list of medical providers for this patient  Review of Systems  Constitutional: Negative for fever, activity change, appetite change, fatigue and unexpected weight change.  HENT: Negative.   Respiratory: Negative.  Negative for cough, chest tightness, shortness of breath and wheezing.   Cardiovascular: Negative.  Negative for chest pain, palpitations and  leg swelling.  Gastrointestinal: Negative.  Negative for abdominal pain, diarrhea, constipation and abdominal distention.  Musculoskeletal: Positive for arthralgias. Negative for myalgias and back pain.  Neurological: Positive for numbness. Negative for dizziness, weakness, light-headedness and headaches.  Psychiatric/Behavioral: Negative.       Objective:   Physical Exam  Constitutional: She is oriented to person, place, and time. She appears well-developed and well-nourished. No distress.  HENT:  Head: Normocephalic and atraumatic.  Eyes: EOM are normal.  Neck: Normal range of motion.  Cardiovascular: Normal rate and regular rhythm.   Pulmonary/Chest: Effort normal and breath sounds normal. No respiratory distress. She has no wheezes. She has no rales.  Abdominal: Soft. Bowel sounds are normal. She exhibits no distension. There is no tenderness. There is no rebound.  Musculoskeletal: She exhibits no tenderness.  Neurological: She is alert and oriented to person, place, and time. Coordination normal.  Skin: Skin is warm and dry.   Filed Vitals:   07/17/14 1014  BP: 118/82  Pulse: 76  Temp: 98.2 F (36.8 C)  TempSrc: Oral  Resp: 12  Height: 5\' 6"  (1.676 m)  Weight: 150 lb (68.04 kg)  SpO2: 99%      Assessment & Plan:

## 2014-07-18 NOTE — Assessment & Plan Note (Signed)
Spoke with patient about the fact that she is overdue for repeat colonoscopy, she does not feel she has any family history of colon cancer and does not wish to have further screening. She was given Prevnar 13 vaccine today. already had flu shot. Completed shingles shot. Reminded her that she is overdue for mammogram and she will consider getting this done.

## 2014-07-18 NOTE — Assessment & Plan Note (Signed)
Check basic metabolic panel given patient on Hydrochlorothiazide, losartan. BP well controlled.

## 2014-08-07 ENCOUNTER — Ambulatory Visit (HOSPITAL_COMMUNITY)
Admission: RE | Admit: 2014-08-07 | Discharge: 2014-08-07 | Disposition: A | Discharge status: Home / Self Care | Payer: BC Managed Care – PPO | Source: Ambulatory Visit | Attending: Obstetrics & Gynecology | Admitting: Obstetrics & Gynecology

## 2014-08-07 DIAGNOSIS — Z1231 Encounter for screening mammogram for malignant neoplasm of breast: Secondary | ICD-10-CM | POA: Diagnosis present

## 2014-09-15 ENCOUNTER — Other Ambulatory Visit (INDEPENDENT_AMBULATORY_CARE_PROVIDER_SITE_OTHER): Payer: BLUE CROSS/BLUE SHIELD

## 2014-09-15 DIAGNOSIS — E785 Hyperlipidemia, unspecified: Secondary | ICD-10-CM

## 2014-09-15 DIAGNOSIS — Z0000 Encounter for general adult medical examination without abnormal findings: Secondary | ICD-10-CM

## 2014-09-15 LAB — VITAMIN B12: Vitamin B-12: 619 pg/mL (ref 211–911)

## 2014-09-17 ENCOUNTER — Other Ambulatory Visit (INDEPENDENT_AMBULATORY_CARE_PROVIDER_SITE_OTHER): Payer: BLUE CROSS/BLUE SHIELD

## 2014-09-17 DIAGNOSIS — Z0000 Encounter for general adult medical examination without abnormal findings: Secondary | ICD-10-CM

## 2014-09-17 DIAGNOSIS — E785 Hyperlipidemia, unspecified: Secondary | ICD-10-CM

## 2014-09-17 LAB — BASIC METABOLIC PANEL
BUN: 29 mg/dL — ABNORMAL HIGH (ref 6–23)
CO2: 27 mEq/L (ref 19–32)
Calcium: 9.7 mg/dL (ref 8.4–10.5)
Chloride: 104 mEq/L (ref 96–112)
Creatinine, Ser: 0.82 mg/dL (ref 0.40–1.20)
GFR: 73.96 mL/min (ref >60.00)
GLUCOSE: 87 mg/dL (ref 70–99)
POTASSIUM: 4 meq/L (ref 3.5–5.1)
Sodium: 141 mEq/L (ref 135–145)

## 2014-09-17 LAB — LIPID PANEL
CHOLESTEROL: 135 mg/dL (ref 0–200)
HDL: 46.7 mg/dL (ref >39.00)
LDL CALC: 55 mg/dL (ref 0–99)
NonHDL: 88.3
TRIGLYCERIDES: 169 mg/dL — AB (ref 0.0–149.0)
Total CHOL/HDL Ratio: 3
VLDL: 33.8 mg/dL (ref 0.0–40.0)

## 2014-09-19 ENCOUNTER — Telehealth: Payer: Self-pay | Admitting: Internal Medicine

## 2014-09-19 NOTE — Telephone Encounter (Signed)
Left message for patient to call me back. 

## 2014-09-19 NOTE — Telephone Encounter (Signed)
Patient is calling in regards to lab results.  She was under impression she would have had more labs done.

## 2014-09-24 ENCOUNTER — Other Ambulatory Visit: Payer: Self-pay | Admitting: Geriatric Medicine

## 2014-09-24 MED ORDER — LOSARTAN POTASSIUM-HCTZ 50-12.5 MG PO TABS: 1.0000 | ORAL_TABLET | Freq: Every day | ORAL | Status: DC

## 2014-10-16 ENCOUNTER — Other Ambulatory Visit (HOSPITAL_COMMUNITY): Payer: Self-pay | Admitting: Obstetrics & Gynecology

## 2014-10-20 ENCOUNTER — Other Ambulatory Visit (HOSPITAL_COMMUNITY): Payer: Self-pay | Admitting: Obstetrics & Gynecology

## 2014-10-20 DIAGNOSIS — N951 Menopausal and female climacteric states: Secondary | ICD-10-CM

## 2014-10-27 ENCOUNTER — Ambulatory Visit (HOSPITAL_COMMUNITY)
Admission: RE | Admit: 2014-10-27 | Discharge: 2014-10-27 | Disposition: A | Discharge status: Home / Self Care | Payer: BLUE CROSS/BLUE SHIELD | Source: Ambulatory Visit | Attending: Obstetrics & Gynecology | Admitting: Obstetrics & Gynecology

## 2014-10-27 DIAGNOSIS — N951 Menopausal and female climacteric states: Secondary | ICD-10-CM

## 2014-10-27 DIAGNOSIS — Z780 Asymptomatic menopausal state: Secondary | ICD-10-CM | POA: Diagnosis not present

## 2014-10-27 DIAGNOSIS — Z13820 Encounter for screening for osteoporosis: Secondary | ICD-10-CM | POA: Insufficient documentation

## 2015-02-19 ENCOUNTER — Ambulatory Visit: Payer: Self-pay | Admitting: Podiatry

## 2015-03-26 ENCOUNTER — Encounter: Payer: Self-pay | Admitting: Podiatry

## 2015-03-26 ENCOUNTER — Ambulatory Visit (INDEPENDENT_AMBULATORY_CARE_PROVIDER_SITE_OTHER): Payer: BLUE CROSS/BLUE SHIELD | Admitting: Podiatry

## 2015-03-26 VITALS — BP 160/84 | HR 70 | Resp 16

## 2015-03-26 DIAGNOSIS — L603 Nail dystrophy: Secondary | ICD-10-CM | POA: Diagnosis not present

## 2015-03-26 NOTE — Progress Notes (Signed)
She presents today after having not seen [place] for over a year. She states that she saw Dr. Ralene Cork last 2 stated that she had mold beneath her hallux nail plate left. She states that at this point is been too long for insurance to cover the medication. So I'm here today to have this reevaluated. She denies any changes in her toenail or nail plate other than she states it seems to be getting worse and she refers to the hallux left. She denies any changes in her past medical history medications allergies are social history and review of systems.   Objective: Vital signs are stable she is alert and oriented 3. Pulses are strongly palpable bilateral. Neurologic sensorium is intact bilateral and brisk deep tendon reflexes are equal bilateral. Muscle strength is normal bilateral. Orthopedic evaluation and straight mild hallux valgus deformity hammertoe deformities are noted A symptomatically. Cutaneous evaluation does demonstrate what appears to be an onychomycosis or nail dystrophy to the distal one third of the hallux nail plate left.  Assessment: Onychodystrophy cannot rule out onychomycosis hallux left.  Plan: Discussed etiology pathology conservative versus surgical therapies. A sample of the nail was taken today and sent for pathologic evaluation. We will notify her once the results come in.

## 2015-04-14 ENCOUNTER — Telehealth: Payer: Self-pay | Admitting: *Deleted

## 2015-04-14 NOTE — Telephone Encounter (Signed)
Informed pt the toenail fungus culture was negative.

## 2015-06-29 ENCOUNTER — Ambulatory Visit (INDEPENDENT_AMBULATORY_CARE_PROVIDER_SITE_OTHER): Payer: BLUE CROSS/BLUE SHIELD | Admitting: Internal Medicine

## 2015-06-29 VITALS — BP 134/88 | HR 73 | Temp 98.3°F | Resp 16 | Ht 66.0 in | Wt 153.0 lb

## 2015-06-29 DIAGNOSIS — G609 Hereditary and idiopathic neuropathy, unspecified: Secondary | ICD-10-CM

## 2015-06-29 DIAGNOSIS — M6281 Muscle weakness (generalized): Secondary | ICD-10-CM

## 2015-06-29 DIAGNOSIS — E785 Hyperlipidemia, unspecified: Secondary | ICD-10-CM

## 2015-06-29 DIAGNOSIS — I10 Essential (primary) hypertension: Secondary | ICD-10-CM

## 2015-06-29 DIAGNOSIS — R29898 Other symptoms and signs involving the musculoskeletal system: Secondary | ICD-10-CM

## 2015-06-29 NOTE — Patient Instructions (Signed)
We will check your blood work fasting and call you back with the results.   If we do not get any good answers we will send you to the nerve specialist to see if they can figure out why your neuropathy seems to be spreading.

## 2015-06-29 NOTE — Progress Notes (Signed)
Pre visit review using our clinic review tool, if applicable. No additional management support is needed unless otherwise documented below in the visit note. 

## 2015-06-30 ENCOUNTER — Other Ambulatory Visit (INDEPENDENT_AMBULATORY_CARE_PROVIDER_SITE_OTHER): Payer: BLUE CROSS/BLUE SHIELD

## 2015-06-30 ENCOUNTER — Encounter: Payer: Self-pay | Admitting: Internal Medicine

## 2015-06-30 DIAGNOSIS — M6281 Muscle weakness (generalized): Secondary | ICD-10-CM

## 2015-06-30 DIAGNOSIS — E785 Hyperlipidemia, unspecified: Secondary | ICD-10-CM

## 2015-06-30 DIAGNOSIS — R29898 Other symptoms and signs involving the musculoskeletal system: Secondary | ICD-10-CM | POA: Insufficient documentation

## 2015-06-30 LAB — TSH: TSH: 1.93 u[IU]/mL (ref 0.35–4.50)

## 2015-06-30 LAB — COMPREHENSIVE METABOLIC PANEL
ALBUMIN: 4.4 g/dL (ref 3.5–5.2)
ALK PHOS: 40 U/L (ref 39–117)
ALT: 22 U/L (ref 0–35)
AST: 16 U/L (ref 0–37)
BILIRUBIN TOTAL: 0.6 mg/dL (ref 0.2–1.2)
BUN: 25 mg/dL — AB (ref 6–23)
CALCIUM: 9.8 mg/dL (ref 8.4–10.5)
CHLORIDE: 102 meq/L (ref 96–112)
CO2: 30 mEq/L (ref 19–32)
CREATININE: 0.85 mg/dL (ref 0.40–1.20)
GFR: 70.79 mL/min (ref >60.00)
Glucose, Bld: 94 mg/dL (ref 70–99)
Potassium: 3.7 mEq/L (ref 3.5–5.1)
SODIUM: 141 meq/L (ref 135–145)
TOTAL PROTEIN: 7.1 g/dL (ref 6.0–8.3)

## 2015-06-30 LAB — LIPID PANEL
CHOL/HDL RATIO: 5
Cholesterol: 186 mg/dL (ref 0–200)
HDL: 40.3 mg/dL (ref >39.00)
NONHDL: 145.72
Triglycerides: 285 mg/dL — ABNORMAL HIGH (ref 0.0–149.0)
VLDL: 57 mg/dL — ABNORMAL HIGH (ref 0.0–40.0)

## 2015-06-30 LAB — CK: Total CK: 76 U/L (ref 7–177)

## 2015-06-30 LAB — T4, FREE: Free T4: 0.84 ng/dL (ref 0.60–1.60)

## 2015-06-30 LAB — LDL CHOLESTEROL, DIRECT: LDL DIRECT: 108 mg/dL

## 2015-06-30 LAB — VITAMIN B12: Vitamin B-12: 540 pg/mL (ref 211–911)

## 2015-06-30 LAB — HEMOGLOBIN A1C: HEMOGLOBIN A1C: 5.7 % (ref 4.6–6.5)

## 2015-06-30 LAB — SEDIMENTATION RATE: Sed Rate: 9 mm/hr (ref 0–22)

## 2015-06-30 LAB — FOLATE: Folate: >23.8 ng/mL (ref >5.9)

## 2015-06-30 NOTE — Assessment & Plan Note (Signed)
This could represent statin myopathy. Checking CK, ESR (to rule out polymyositis). No real tenderness on exam today. Weakness very mild. Checking kidney and liver function as well as CBC. Do not feel that this is representative of stroke symptoms. See neuropathy below for more details.

## 2015-06-30 NOTE — Progress Notes (Signed)
   Subjective:    Patient ID: Kimberly Cox, female    DOB: 1946-04-16, 69 y.o.   MRN: [id number]  HPI The patient is a 69 YO female coming in for follow up of her neuropathy in her legs. She has previously underwent nerve conduction in her legs which did not elicit a cause. She has been dealing with it for some time. Now the problem is moving from her feet only to her knees. She cannot remember any injury or other changes. She is also starting to have some symptoms in her arm. Her right arm is having some change in sensation in her extensor surface. She felt that she was also having some weakness in her whole arm and pain in the upper triceps area. She stopped taking her cholesterol medicine about 2 weeks ago and the pain has gotten slightly better but the weakness still seems present. Denies shoulder or shrug weakness. No central weakness. No weakness in her legs or left arm. No facial droop or change. No new rash or fevers or weight change.   Review of Systems  Constitutional: Negative for fever, activity change, appetite change, fatigue and unexpected weight change.  Respiratory: Negative for cough, chest tightness, shortness of breath and wheezing.   Cardiovascular: Negative for chest pain, palpitations and leg swelling.  Gastrointestinal: Negative for nausea, abdominal pain, diarrhea, constipation and abdominal distention.  Musculoskeletal: Positive for myalgias. Negative for back pain, joint swelling, arthralgias and gait problem.  Skin: Negative.   Neurological: Positive for weakness and numbness. Negative for dizziness, seizures, syncope, facial asymmetry, speech difficulty, light-headedness and headaches.  Psychiatric/Behavioral: Negative.       Objective:   Physical Exam  Constitutional: She is oriented to person, place, and time. She appears well-developed and well-nourished. No distress.  HENT:  Head: Normocephalic and atraumatic.  Eyes: EOM are normal.  Neck: Normal range of  motion.  Cardiovascular: Normal rate and regular rhythm.   Pulmonary/Chest: Effort normal and breath sounds normal. No respiratory distress. She has no wheezes. She has no rales.  Abdominal: Soft. Bowel sounds are normal. She exhibits no distension. There is no tenderness. There is no rebound.  Musculoskeletal: She exhibits no tenderness.  No tenderness could be elicited in the triceps or shoulder region.  Neurological: She is alert and oriented to person, place, and time. Coordination normal.  Strength in the right arm slightly weaker than the left, sensation intact but she states feel slightly different in the extensor surface right than left, some neuropathy in her feet, mild changes on the shins but generally sensation intact. Strength and reflexes intact in the legs and reflex in the arms intact and equal.   Skin: Skin is warm and dry.   Filed Vitals:   06/29/15 1015  BP: 134/88  Pulse: 73  Temp: 98.3 F (36.8 C)  Resp: 16  Height: 5\' 6"  (1.676 m)  Weight: 153 lb (69.4 kg)  SpO2: 98%       Assessment & Plan:

## 2015-06-30 NOTE — Assessment & Plan Note (Addendum)
Had prior nerve conduction study about 7 years ago. Checking B12 and folate today and CMP and HgA1c. If no answers and if no resolution of the arm symptoms will refer to neurology for further nerve testing. At this time not requiring medication for her symptoms.

## 2015-06-30 NOTE — Assessment & Plan Note (Signed)
Checking lipid panel to see how her cholesterol is doing off simvastatin 40 mg daily. Possible was experiencing side effects.

## 2015-06-30 NOTE — Assessment & Plan Note (Signed)
BP still well controlled on her losartan/hctz. Checking labs today.

## 2015-07-06 ENCOUNTER — Encounter: Payer: Self-pay | Admitting: Internal Medicine

## 2015-07-08 ENCOUNTER — Other Ambulatory Visit: Payer: Self-pay

## 2015-07-08 DIAGNOSIS — Z1231 Encounter for screening mammogram for malignant neoplasm of breast: Secondary | ICD-10-CM

## 2015-07-21 ENCOUNTER — Telehealth: Payer: Self-pay | Admitting: Internal Medicine

## 2015-07-21 NOTE — Telephone Encounter (Signed)
[facility] has been trying to fax refill request for simvastatin (ZOCOR) 40 MG tablet [[id number] since 11/10. Pt is going out of town tomorrow. Can we refill this please

## 2015-07-22 ENCOUNTER — Other Ambulatory Visit: Payer: Self-pay | Admitting: Geriatric Medicine

## 2015-07-22 MED ORDER — SIMVASTATIN 40 MG PO TABS: 40.0000 mg | ORAL_TABLET | Freq: Every day | ORAL | Status: DC

## 2015-07-22 NOTE — Telephone Encounter (Signed)
Sent to pharmacy 

## 2015-08-05 ENCOUNTER — Encounter: Payer: Self-pay | Admitting: Internal Medicine

## 2015-08-05 ENCOUNTER — Other Ambulatory Visit: Payer: BLUE CROSS/BLUE SHIELD

## 2015-08-05 ENCOUNTER — Ambulatory Visit (INDEPENDENT_AMBULATORY_CARE_PROVIDER_SITE_OTHER): Payer: BLUE CROSS/BLUE SHIELD | Admitting: Internal Medicine

## 2015-08-05 ENCOUNTER — Ambulatory Visit (INDEPENDENT_AMBULATORY_CARE_PROVIDER_SITE_OTHER)
Admission: RE | Admit: 2015-08-05 | Discharge: 2015-08-05 | Disposition: A | Discharge status: Home / Self Care | Payer: BLUE CROSS/BLUE SHIELD | Source: Ambulatory Visit | Attending: Internal Medicine | Admitting: Internal Medicine

## 2015-08-05 VITALS — BP 128/82 | HR 69 | Temp 98.3°F | Resp 16 | Ht 65.0 in | Wt 156.0 lb

## 2015-08-05 DIAGNOSIS — Z0000 Encounter for general adult medical examination without abnormal findings: Secondary | ICD-10-CM

## 2015-08-05 DIAGNOSIS — R05 Cough: Secondary | ICD-10-CM | POA: Diagnosis not present

## 2015-08-05 DIAGNOSIS — R059 Cough, unspecified: Secondary | ICD-10-CM

## 2015-08-05 DIAGNOSIS — I10 Essential (primary) hypertension: Secondary | ICD-10-CM

## 2015-08-05 NOTE — Progress Notes (Signed)
Pre visit review using our clinic review tool, if applicable. No additional management support is needed unless otherwise documented below in the visit note. 

## 2015-08-05 NOTE — Progress Notes (Signed)
   Subjective:    Patient ID: Kimberly Cox, female    DOB: 1946-03-30, 69 y.o.   MRN: [id number]  HPI The patient is a 69 YO female coming in for wellness. No new concerns.   [facility], social history reviewed and updated.   Review of Systems  Constitutional: Negative for fever, activity change, appetite change, fatigue and unexpected weight change.  HENT: Negative.   Eyes: Negative.   Respiratory: Negative for cough, chest tightness, shortness of breath and wheezing.   Cardiovascular: Negative for chest pain, palpitations and leg swelling.  Gastrointestinal: Negative for nausea, abdominal pain, diarrhea, constipation and abdominal distention.  Musculoskeletal: Negative for myalgias, back pain, joint swelling, arthralgias and gait problem.  Skin: Negative.   Neurological: Positive for numbness. Negative for dizziness, seizures, syncope, facial asymmetry, speech difficulty, weakness, light-headedness and headaches.  Psychiatric/Behavioral: Negative.       Objective:   Physical Exam  Constitutional: She is oriented to person, place, and time. She appears well-developed and well-nourished. No distress.  HENT:  Head: Normocephalic and atraumatic.  Eyes: EOM are normal.  Neck: Normal range of motion.  Cardiovascular: Normal rate and regular rhythm.   Pulmonary/Chest: Effort normal and breath sounds normal. No respiratory distress. She has no wheezes. She has no rales.  Abdominal: Soft. Bowel sounds are normal. She exhibits no distension. There is no tenderness. There is no rebound.  Musculoskeletal: She exhibits no tenderness.  Neurological: She is alert and oriented to person, place, and time. Coordination normal.  Skin: Skin is warm and dry.   Filed Vitals:   08/05/15 1002  BP: 128/82  Pulse: 69  Temp: 98.3 F (36.8 C)  TempSrc: Oral  Resp: 16  Height: 5\' 5"  (1.651 m)  Weight: 156 lb (70.761 kg)  SpO2: 97%   EKG: rate 63, left axis, sinus, normal intervals, no ST or T  wave changes, no change from prior in 2011.     Assessment & Plan:

## 2015-08-05 NOTE — Patient Instructions (Signed)
We have checked the EKG today and it is normal. If you start having more of these episodes we may send you to a heart doctor. If they remain infrequent we do not need to do anything further.   We will check the blood today and the chest x-ray and call you with the results.

## 2015-08-06 LAB — HEPATITIS C ANTIBODY: HCV AB: NEGATIVE

## 2015-08-06 NOTE — Assessment & Plan Note (Signed)
Checking hep c screening. Agreed to go cologuard. Pneumonia series completed and flu this year already. Non-smoker. Does not exercise and we talked about resumption of that for her overall health.

## 2015-08-12 ENCOUNTER — Ambulatory Visit: Payer: BLUE CROSS/BLUE SHIELD

## 2015-08-21 ENCOUNTER — Other Ambulatory Visit: Payer: Self-pay | Admitting: Orthopedic Surgery

## 2015-09-14 ENCOUNTER — Ambulatory Visit: Payer: BLUE CROSS/BLUE SHIELD

## 2015-09-17 ENCOUNTER — Other Ambulatory Visit: Payer: Self-pay | Admitting: Internal Medicine

## 2015-09-17 NOTE — Telephone Encounter (Signed)
Left msg on triage stating she is leaving going out of town tomorrow will run out of BP med before so [facility] will be sending refill request to fill early. Called pt back inform will send back approval for refill...Raechel Chute

## 2015-10-01 ENCOUNTER — Encounter (HOSPITAL_BASED_OUTPATIENT_CLINIC_OR_DEPARTMENT_OTHER): Payer: Self-pay | Admitting: *Deleted

## 2015-10-05 ENCOUNTER — Encounter (HOSPITAL_BASED_OUTPATIENT_CLINIC_OR_DEPARTMENT_OTHER)
Admission: RE | Admit: 2015-10-05 | Discharge: 2015-10-05 | Disposition: A | Discharge status: Home / Self Care | Payer: BLUE CROSS/BLUE SHIELD | Source: Ambulatory Visit | Attending: Orthopedic Surgery | Admitting: Orthopedic Surgery

## 2015-10-05 DIAGNOSIS — E785 Hyperlipidemia, unspecified: Secondary | ICD-10-CM | POA: Diagnosis not present

## 2015-10-05 DIAGNOSIS — F419 Anxiety disorder, unspecified: Secondary | ICD-10-CM | POA: Diagnosis not present

## 2015-10-05 DIAGNOSIS — I10 Essential (primary) hypertension: Secondary | ICD-10-CM | POA: Diagnosis not present

## 2015-10-05 DIAGNOSIS — Z7982 Long term (current) use of aspirin: Secondary | ICD-10-CM | POA: Diagnosis not present

## 2015-10-05 DIAGNOSIS — M2021 Hallux rigidus, right foot: Secondary | ICD-10-CM | POA: Diagnosis present

## 2015-10-05 DIAGNOSIS — M7741 Metatarsalgia, right foot: Secondary | ICD-10-CM | POA: Diagnosis not present

## 2015-10-05 DIAGNOSIS — Z87891 Personal history of nicotine dependence: Secondary | ICD-10-CM | POA: Diagnosis not present

## 2015-10-05 DIAGNOSIS — M2041 Other hammer toe(s) (acquired), right foot: Secondary | ICD-10-CM | POA: Diagnosis not present

## 2015-10-05 LAB — BASIC METABOLIC PANEL
Anion gap: 13 (ref 5–15)
BUN: 15 mg/dL (ref 6–20)
CALCIUM: 9.8 mg/dL (ref 8.9–10.3)
CO2: 30 mmol/L (ref 22–32)
CREATININE: 0.75 mg/dL (ref 0.44–1.00)
Chloride: 101 mmol/L (ref 101–111)
GFR calc non Af Amer: >60 mL/min (ref >60)
GLUCOSE: 93 mg/dL (ref 65–99)
Potassium: 3.7 mmol/L (ref 3.5–5.1)
Sodium: 144 mmol/L (ref 135–145)

## 2015-10-06 LAB — COLOGUARD: COLOGUARD: NEGATIVE

## 2015-10-08 ENCOUNTER — Encounter (HOSPITAL_BASED_OUTPATIENT_CLINIC_OR_DEPARTMENT_OTHER): Payer: Self-pay | Admitting: *Deleted

## 2015-10-08 ENCOUNTER — Encounter (HOSPITAL_BASED_OUTPATIENT_CLINIC_OR_DEPARTMENT_OTHER)
Admission: RE | Disposition: A | Discharge status: Home / Self Care | Payer: Self-pay | Source: Ambulatory Visit | Attending: Orthopedic Surgery

## 2015-10-08 ENCOUNTER — Ambulatory Visit (HOSPITAL_BASED_OUTPATIENT_CLINIC_OR_DEPARTMENT_OTHER)
Admission: RE | Admit: 2015-10-08 | Discharge: 2015-10-08 | Disposition: A | Discharge status: Home / Self Care | Payer: BLUE CROSS/BLUE SHIELD | Source: Ambulatory Visit | Attending: Orthopedic Surgery | Admitting: Orthopedic Surgery

## 2015-10-08 ENCOUNTER — Ambulatory Visit (HOSPITAL_BASED_OUTPATIENT_CLINIC_OR_DEPARTMENT_OTHER): Payer: BLUE CROSS/BLUE SHIELD | Admitting: Certified Registered"

## 2015-10-08 DIAGNOSIS — M2041 Other hammer toe(s) (acquired), right foot: Secondary | ICD-10-CM | POA: Insufficient documentation

## 2015-10-08 DIAGNOSIS — M2021 Hallux rigidus, right foot: Secondary | ICD-10-CM | POA: Insufficient documentation

## 2015-10-08 DIAGNOSIS — E785 Hyperlipidemia, unspecified: Secondary | ICD-10-CM | POA: Insufficient documentation

## 2015-10-08 DIAGNOSIS — F419 Anxiety disorder, unspecified: Secondary | ICD-10-CM | POA: Insufficient documentation

## 2015-10-08 DIAGNOSIS — Z7982 Long term (current) use of aspirin: Secondary | ICD-10-CM | POA: Insufficient documentation

## 2015-10-08 DIAGNOSIS — Z87891 Personal history of nicotine dependence: Secondary | ICD-10-CM | POA: Insufficient documentation

## 2015-10-08 DIAGNOSIS — I10 Essential (primary) hypertension: Secondary | ICD-10-CM | POA: Insufficient documentation

## 2015-10-08 DIAGNOSIS — M7741 Metatarsalgia, right foot: Secondary | ICD-10-CM | POA: Insufficient documentation

## 2015-10-08 HISTORY — PX: ARTHRODESIS METATARSALPHALANGEAL JOINT (MTPJ): SHX6566

## 2015-10-08 HISTORY — PX: HAMMERTOE RECONSTRUCTION WITH WEIL OSTEOTOMY: SHX5631

## 2015-10-08 SURGERY — FUSION, JOINT, GREAT TOE
Anesthesia: Regional | Site: Toe | Laterality: Right

## 2015-10-08 MED ORDER — ONDANSETRON HCL 4 MG/2ML IJ SOLN
INTRAMUSCULAR | Status: DC | PRN
  Administered 2015-10-08: 4 mg via INTRAVENOUS

## 2015-10-08 MED ORDER — FENTANYL CITRATE (PF) 100 MCG/2ML IJ SOLN
50.0000 ug | INTRAMUSCULAR | Status: DC | PRN
  Administered 2015-10-08: 100 ug via INTRAVENOUS

## 2015-10-08 MED ORDER — SODIUM CHLORIDE 0.9 % IV SOLN: INTRAVENOUS | Status: DC

## 2015-10-08 MED ORDER — OXYCODONE HCL 5 MG PO TABS: 5.0000 mg | ORAL_TABLET | ORAL | Status: DC | PRN

## 2015-10-08 MED ORDER — PROPOFOL 10 MG/ML IV BOLUS
INTRAVENOUS | Status: DC | PRN
  Administered 2015-10-08: 100 mg via INTRAVENOUS

## 2015-10-08 MED ORDER — LIDOCAINE HCL (CARDIAC) 20 MG/ML IV SOLN
INTRAVENOUS | Status: AC
  Filled 2015-10-08: qty 5

## 2015-10-08 MED ORDER — MIDAZOLAM HCL 2 MG/2ML IJ SOLN
INTRAMUSCULAR | Status: AC
  Filled 2015-10-08: qty 2

## 2015-10-08 MED ORDER — CEFAZOLIN SODIUM-DEXTROSE 2-3 GM-% IV SOLR
2.0000 g | INTRAVENOUS | Status: AC
  Administered 2015-10-08: 2 g via INTRAVENOUS

## 2015-10-08 MED ORDER — EPHEDRINE SULFATE 50 MG/ML IJ SOLN
INTRAMUSCULAR | Status: DC | PRN
  Administered 2015-10-08: 10 mg via INTRAVENOUS

## 2015-10-08 MED ORDER — LACTATED RINGERS IV SOLN
INTRAVENOUS | Status: DC
  Administered 2015-10-08 (×2): via INTRAVENOUS

## 2015-10-08 MED ORDER — GLYCOPYRROLATE 0.2 MG/ML IJ SOLN: 0.2000 mg | Freq: Once | INTRAMUSCULAR | Status: DC | PRN

## 2015-10-08 MED ORDER — CHLORHEXIDINE GLUCONATE 4 % EX LIQD: 60.0000 mL | Freq: Once | CUTANEOUS | Status: DC

## 2015-10-08 MED ORDER — MIDAZOLAM HCL 2 MG/2ML IJ SOLN
1.0000 mg | INTRAMUSCULAR | Status: DC | PRN
  Administered 2015-10-08: 2 mg via INTRAVENOUS

## 2015-10-08 MED ORDER — CEFAZOLIN SODIUM-DEXTROSE 2-3 GM-% IV SOLR
INTRAVENOUS | Status: AC
  Filled 2015-10-08: qty 50

## 2015-10-08 MED ORDER — BUPIVACAINE-EPINEPHRINE (PF) 0.5% -1:200000 IJ SOLN
INTRAMUSCULAR | Status: DC | PRN
  Administered 2015-10-08: 25 mL via PERINEURAL

## 2015-10-08 MED ORDER — PROMETHAZINE HCL 25 MG/ML IJ SOLN: 6.2500 mg | INTRAMUSCULAR | Status: DC | PRN

## 2015-10-08 MED ORDER — DEXAMETHASONE SODIUM PHOSPHATE 10 MG/ML IJ SOLN
INTRAMUSCULAR | Status: AC
  Filled 2015-10-08: qty 1

## 2015-10-08 MED ORDER — ONDANSETRON HCL 4 MG/2ML IJ SOLN
INTRAMUSCULAR | Status: AC
  Filled 2015-10-08: qty 2

## 2015-10-08 MED ORDER — 0.9 % SODIUM CHLORIDE (POUR BTL) OPTIME
TOPICAL | Status: DC | PRN
  Administered 2015-10-08: 200 mL

## 2015-10-08 MED ORDER — PROPOFOL 500 MG/50ML IV EMUL
INTRAVENOUS | Status: AC
  Filled 2015-10-08: qty 50

## 2015-10-08 MED ORDER — SCOPOLAMINE 1 MG/3DAYS TD PT72: 1.0000 | MEDICATED_PATCH | Freq: Once | TRANSDERMAL | Status: DC | PRN

## 2015-10-08 MED ORDER — HYDROMORPHONE HCL 1 MG/ML IJ SOLN: 0.2500 mg | INTRAMUSCULAR | Status: DC | PRN

## 2015-10-08 MED ORDER — FENTANYL CITRATE (PF) 100 MCG/2ML IJ SOLN
INTRAMUSCULAR | Status: AC
  Filled 2015-10-08: qty 2

## 2015-10-08 MED ORDER — LIDOCAINE HCL (CARDIAC) 20 MG/ML IV SOLN
INTRAVENOUS | Status: DC | PRN
  Administered 2015-10-08: 30 mg via INTRAVENOUS

## 2015-10-08 MED ORDER — DEXAMETHASONE SODIUM PHOSPHATE 10 MG/ML IJ SOLN
INTRAMUSCULAR | Status: DC | PRN
  Administered 2015-10-08: 10 mg via INTRAVENOUS

## 2015-10-08 SURGICAL SUPPLY — 88 items
BANDAGE ESMARK 6X9 LF (GAUZE/BANDAGES/DRESSINGS) ×2 IMPLANT
BIT DRILL 2.0 (BIT) ×1
BIT DRILL 2XNS DISP SS SM FRAG (BIT) ×2 IMPLANT
BIT DRILL CANN 2.4 (BIT) ×3
BIT DRILL CANN MAX VPC 2.4 (BIT) ×2 IMPLANT
BIT DRL 2XNS DISP SS SM FRAG (BIT) ×2
BLADE AVERAGE 25X9 (BLADE) IMPLANT
BLADE LONG MED 25X9 (BLADE) ×3 IMPLANT
BLADE MICRO SAGITTAL (BLADE) IMPLANT
BLADE OSC/SAG .038X5.5 CUT EDG (BLADE) ×3 IMPLANT
BLADE SURG 15 STRL LF DISP TIS (BLADE) ×4 IMPLANT
BLADE SURG 15 STRL SS (BLADE) ×2
BNDG COHESIVE 4X5 TAN STRL (GAUZE/BANDAGES/DRESSINGS) ×3 IMPLANT
BNDG COHESIVE 6X5 TAN STRL LF (GAUZE/BANDAGES/DRESSINGS) ×3 IMPLANT
BNDG CONFORM 2 STRL LF (GAUZE/BANDAGES/DRESSINGS) ×3 IMPLANT
BNDG CONFORM 3 STRL LF (GAUZE/BANDAGES/DRESSINGS) IMPLANT
BNDG ESMARK 4X9 LF (GAUZE/BANDAGES/DRESSINGS) IMPLANT
BNDG ESMARK 6X9 LF (GAUZE/BANDAGES/DRESSINGS) ×3
CAP PIN PROTECTOR ORTHO WHT (CAP) IMPLANT
CHLORAPREP W/TINT 26ML (MISCELLANEOUS) ×3 IMPLANT
COVER BACK TABLE 60X90IN (DRAPES) ×3 IMPLANT
CUFF TOURNIQUET SINGLE 34IN LL (TOURNIQUET CUFF) ×3 IMPLANT
DRAPE EXTREMITY T 121X128X90 (DRAPE) ×3 IMPLANT
DRAPE OEC MINIVIEW 54X84 (DRAPES) ×3 IMPLANT
DRAPE U-SHAPE 47X51 STRL (DRAPES) ×3 IMPLANT
DRSG MEPITEL 4X7.2 (GAUZE/BANDAGES/DRESSINGS) ×3 IMPLANT
DRSG PAD ABDOMINAL 8X10 ST (GAUZE/BANDAGES/DRESSINGS) ×6 IMPLANT
ELECT REM PT RETURN 9FT ADLT (ELECTROSURGICAL) ×3
ELECTRODE REM PT RTRN 9FT ADLT (ELECTROSURGICAL) ×2 IMPLANT
GAUZE SPONGE 4X4 12PLY STRL (GAUZE/BANDAGES/DRESSINGS) ×3 IMPLANT
GLOVE BIO SURGEON STRL SZ8 (GLOVE) ×3 IMPLANT
GLOVE BIOGEL PI IND STRL 7.0 (GLOVE) ×4 IMPLANT
GLOVE BIOGEL PI IND STRL 8 (GLOVE) ×2 IMPLANT
GLOVE BIOGEL PI INDICATOR 7.0 (GLOVE) ×2
GLOVE BIOGEL PI INDICATOR 8 (GLOVE) ×1
GLOVE ECLIPSE 6.5 STRL STRAW (GLOVE) ×3 IMPLANT
GLOVE ECLIPSE 7.5 STRL STRAW (GLOVE) IMPLANT
GLOVE EXAM NITRILE MD LF STRL (GLOVE) IMPLANT
GOWN STRL REUS W/ TWL LRG LVL3 (GOWN DISPOSABLE) ×2 IMPLANT
GOWN STRL REUS W/ TWL XL LVL3 (GOWN DISPOSABLE) ×2 IMPLANT
GOWN STRL REUS W/TWL LRG LVL3 (GOWN DISPOSABLE) ×1
GOWN STRL REUS W/TWL XL LVL3 (GOWN DISPOSABLE) ×1
K-WIRE .054X4 (WIRE) IMPLANT
K-WIRE COCR 1.1X105 (WIRE) ×3
K-WIRE THREADED 1.25 (WIRE) ×6
KWIRE COCR 1.1X105 (WIRE) ×2 IMPLANT
KWIRE THREADED 1.25 (WIRE) ×4 IMPLANT
NEEDLE HYPO 22GX1.5 SAFETY (NEEDLE) IMPLANT
NS IRRIG 1000ML POUR BTL (IV SOLUTION) ×3 IMPLANT
PACK BASIN DAY SURGERY FS (CUSTOM PROCEDURE TRAY) ×3 IMPLANT
PAD CAST 4YDX4 CTTN HI CHSV (CAST SUPPLIES) ×2 IMPLANT
PADDING CAST ABS 4INX4YD NS (CAST SUPPLIES)
PADDING CAST ABS COTTON 4X4 ST (CAST SUPPLIES) IMPLANT
PADDING CAST COTTON 4X4 STRL (CAST SUPPLIES) ×1
PADDING CAST COTTON 6X4 STRL (CAST SUPPLIES) ×3 IMPLANT
PASSER SUT SWANSON 36MM LOOP (INSTRUMENTS) IMPLANT
PENCIL BUTTON HOLSTER BLD 10FT (ELECTRODE) ×3 IMPLANT
PLATE SM 1/4 TUBULAR 5H (Plate) ×3 IMPLANT
SANITIZER HAND PURELL 535ML FO (MISCELLANEOUS) ×3 IMPLANT
SCREW CANN PT 4.0X34 (Screw) ×3 IMPLANT
SCREW CORTICAL 2.7MM  18MM (Screw) ×3 IMPLANT
SCREW CORTICAL 2.7MM  20MM (Screw) ×1 IMPLANT
SCREW CORTICAL 2.7MM 18MM (Screw) ×6 IMPLANT
SCREW CORTICAL 2.7MM 20MM (Screw) ×2 IMPLANT
SCREW HCS TWIST-OFF 2.0X14MM (Screw) ×3 IMPLANT
SCREW VPC 4.0X30MM (Screw) ×2 IMPLANT
SHEET MEDIUM DRAPE 40X70 STRL (DRAPES) ×3 IMPLANT
SLEEVE SCD COMPRESS KNEE MED (MISCELLANEOUS) ×3 IMPLANT
SPLINT FAST PLASTER 5X30 (CAST SUPPLIES) ×20
SPLINT PLASTER CAST FAST 5X30 (CAST SUPPLIES) ×40 IMPLANT
SPONGE LAP 18X18 X RAY DECT (DISPOSABLE) ×3 IMPLANT
SPONGE SURGIFOAM ABS GEL 12-7 (HEMOSTASIS) IMPLANT
STOCKINETTE 6  STRL (DRAPES) ×1
STOCKINETTE 6 STRL (DRAPES) ×2 IMPLANT
SUCTION FRAZIER HANDLE 10FR (MISCELLANEOUS) ×1
SUCTION TUBE FRAZIER 10FR DISP (MISCELLANEOUS) ×2 IMPLANT
SUT ETHILON 3 0 PS 1 (SUTURE) ×6 IMPLANT
SUT MNCRL AB 3-0 PS2 18 (SUTURE) ×6 IMPLANT
SUT VIC AB 0 SH 27 (SUTURE) IMPLANT
SUT VIC AB 2-0 SH 27 (SUTURE) ×1
SUT VIC AB 2-0 SH 27XBRD (SUTURE) ×2 IMPLANT
SYR BULB 3OZ (MISCELLANEOUS) ×3 IMPLANT
SYR CONTROL 10ML LL (SYRINGE) IMPLANT
TOWEL OR 17X24 6PK STRL BLUE (TOWEL DISPOSABLE) ×6 IMPLANT
TUBE CONNECTING 20X1/4 (TUBING) ×3 IMPLANT
UNDERPAD 30X30 (UNDERPADS AND DIAPERS) ×3 IMPLANT
VPC SCREW 4.0X30MM (Screw) ×3 IMPLANT
YANKAUER SUCT BULB TIP NO VENT (SUCTIONS) IMPLANT

## 2015-10-08 NOTE — Anesthesia Procedure Notes (Addendum)
Procedure Name: LMA Insertion Date/Time: 10/08/2015 1:32 PM Performed by: BLOCKER, TIMOTHY D Pre-anesthesia Checklist: Patient identified, Emergency Drugs available, Suction available and Patient being monitored Patient Re-evaluated:Patient Re-evaluated prior to inductionOxygen Delivery Method: Circle System Utilized Preoxygenation: Pre-oxygenation with 100% oxygen Intubation Type: IV induction Ventilation: Mask ventilation without difficulty LMA: LMA inserted LMA Size: 4.0 Number of attempts: 1 Airway Equipment and Method: Bite block Placement Confirmation: positive ETCO2 Tube secured with: Tape Dental Injury: Teeth and Oropharynx as per pre-operative assessment    Anesthesia Regional Block:  Popliteal block  Pre-Anesthetic Checklist: ,, timeout performed, Correct Patient, Correct Site, Correct Laterality, Correct Procedure, Correct Position, site marked, Risks and benefits discussed,  Surgical consent,  Pre-op evaluation,  At surgeon's request and post-op pain management  Laterality: Right  Prep: chloraprep       Needles:  Injection technique: Single-shot  Needle Type: Echogenic Stimulator Needle     Needle Length: 9cm 9 cm Needle Gauge: 21 and 21 G    Additional Needles:  Procedures: ultrasound guided (picture in chart) and nerve stimulator Popliteal block  Nerve Stimulator or Paresthesia:  Response: tibial, 0.5 mA,  Response: peroneal, 0.5 mA,   Additional Responses:   Narrative:  Start time: 10/08/2015 12:45 PM End time: 10/08/2015 12:53 PM Injection made incrementally with aspirations every 5 mL.  Performed by: Personally  Anesthesiologist: Marcene Duos

## 2015-10-08 NOTE — Brief Op Note (Signed)
10/08/2015  2:48 PM  PATIENT:  Kimberly Cox  70 y.o. female  PRE-OPERATIVE DIAGNOSIS:   1.  Right hallux rigidus      2.  Right 2nd hammertoe and metatarsalgia  POST-OPERATIVE DIAGNOSIS:  Same  Procedure(s): 1.  Right hallux MTP joint arthrodesis 2.  Right 2nd metatarsal Weil osteotomy (separate incision) 3.  Right 2nd hammertoe correction (separate incision) 4.  Right foot AP and lateral xrays  SURGEON:  Toni Arthurs, MD  ASSISTANT: n/a  ANESTHESIA:   General, regional  EBL:  minimal   TOURNIQUET:   Total Tourniquet Time Documented: Thigh (Right) - 51 minutes Total: Thigh (Right) - 51 minutes  COMPLICATIONS:  None apparent  DISPOSITION:  Extubated, awake and stable to recovery.  DICTATION ID:  [id number]

## 2015-10-08 NOTE — Op Note (Signed)
Kimberly Cox, Kimberly Cox                ACCOUNT NO.:  [id number]  MEDICAL RECORD NO.:  [id number]  LOCATION:                                 FACILITY:  PHYSICIAN:  Toni Arthurs, MD             DATE OF BIRTH:  DATE OF PROCEDURE:  10/08/2015 DATE OF DISCHARGE:                              OPERATIVE REPORT   PREOPERATIVE DIAGNOSES: 1. Right hallux rigidus. 2. Right second hammertoe. 3. Right second metatarsalgia.  POSTOPERATIVE DIAGNOSES: 1. Right hallux rigidus. 2. Right second hammertoe. 3. Right second metatarsalgia.  PROCEDURE: 1. Right hallux metatarsophalangeal joint arthrodesis. 2. Right second metatarsal Weil osteotomy through a separate incision. 3. Right second hammertoe correction through a separate incision. 4. Right foot AP and lateral radiographs.  SURGEON:  Toni Arthurs, M.D.  ANESTHESIA:  General, regional.  ESTIMATED BLOOD LOSS:  Minimal.  TOURNIQUET TIME:  51 minutes at 250 mmHg.  COMPLICATIONS:  None apparent.  DISPOSITION:  Extubated, awake, and stable to recovery.  INDICATIONS FOR PROCEDURE:  The patient is a 70 year old woman with a long history of right forefoot pain.  She has hallux rigidus as well as second hammertoe deformity and has failed nonoperative treatment to date.  She presents today for surgical correction.  She understands the risks and benefits, the alternative treatment options, and elects surgical treatment.  She specifically understands risks of bleeding, infection, nerve damage, blood clots, need for additional surgery, continued pain, recurrence of deformities, amputation and death.  PROCEDURE IN DETAIL:  After preoperative consent was obtained and the correct operative site was identified, the patient was brought to the operating room and placed supine on the operating table.  General anesthesia was induced.  Preoperative antibiotics were administered. Surgical time-out was taken.  The right lower extremity was prepped and draped  in standard sterile fashion with tourniquet around the thigh. Extremity was exsanguinated and the tourniquet was inflated to 250 mmHg. A longitudinal incision was made over the hallux MP joint.  Sharp dissection was carried down through skin and subcutaneous tissue.  Care was taken to protect the extensor hallucis longus tendon.  Metatarsal head was exposed and the collateral ligaments and plantar plate were released.  A concave reamer was used to remove the remaining articular cartilage and subchondral bone.  Peripheral osteophytes were removed with a rongeur.  The base of the proximal phalanx was then exposed and a convex reamer was used to remove the remaining articular cartilage and subchondral bone.  Bone spurs were removed with a rongeur.  Wound was irrigated copiously.  Both sides of the joint were then perforated with a 2-mm drill bit leaving the resultant bone graft in place.  The joint was reduced and provisionally pinned.  AP and lateral radiographs confirmed appropriate reduction of the hallux MP joint.  Joint was then fixed with a 4-mm partially threaded cannulated screw from the [employer] screw set.  A five-hole 1/4 tubular plate from the [employer] small frag set was then used to fix the joint dorsally with 2 screws distally and 2 screws proximally.  AP and lateral radiographs confirmed appropriate position of the hardware and appropriate correction of the hallux  rigidus deformity.  Wound was irrigated copiously.  Monocryl and nylon were used to close the incision in a layered fashion.  Attention was then turned to the second toe where a longitudinal incision was made over the MTP joint.  Sharp dissection was carried down through the skin and subcutaneous tissue.  The extensor tendons were protected.  The metatarsal head was exposed and a Weil osteotomy was made with the oscillating saw.  A wedge of bone was removed distally and the metatarsal head was allowed to retract  proximally several millimeters.  The osteotomy was fixed with a 2-mm Biomet twist-off screw.  The overhanging bone was trimmed with a rongeur.  Attention was then turned to the PIP joint where transverse incision was made.  Sharp dissection was carried down through the skin and extensor mechanism.  The head of the proximal phalanx was resected followed by the base of the middle phalanx.  The joint was reduced and then fixed with a 3.4-mm cannulated headless compression screw also from [employer]. Final AP and lateral radiographs confirmed appropriate position and length of all hardware.  Wounds were irrigated copiously.  Monocryl and nylon were used to close the dorsal foot incision and nylon was used to close the tip of the toe and the dorsum of the toe.  Sterile dressings were applied followed by a well-padded short-leg splint.  The tourniquet was released at 51 minutes after application of the dressings.  The patient was awakened from anesthesia and transported to the recovery room in stable condition.  FOLLOWUP PLAN:  The patient will be nonweightbearing on right lower extremity.  She will follow up with me in the office in 2 weeks for suture removal and conversion to a CAM walker boot.  She will take aspirin for DVT prophylaxis.  RADIOGRAPHS:  AP and lateral radiographs of the right foot were obtained intraoperatively.  These show interval correction of the hallux rigidus deformity with arthrodesis of the MP joint.  The second hammertoe deformity was corrected as well.  Hardware was appropriately positioned and of the appropriate length.  No other acute injuries were noted.     Toni Arthurs, MD     JH/MEDQ  D:  10/08/2015  T:  10/08/2015  Job:  [id number]

## 2015-10-08 NOTE — Discharge Instructions (Addendum)
Toni Arthurs, MD [facility]  Please read the following information regarding your care after surgery.  Medications  You only need a prescription for the narcotic pain medicine (ex. oxycodone, Percocet, Norco).  All of the other medicines listed below are available over the counter. X acetominophen (Tylenol) 650 mg every 4-6 hours as you need for minor pain X oxycodone as prescribed for moderate to severe pain X Aleve 2 pills twice a day for 5 days.   Narcotic pain medicine (ex. oxycodone, Percocet, Vicodin) will cause constipation.  To prevent this problem, take the following medicines while you are taking any pain medicine. X docusate sodium (Colace) 100 mg twice a day X senna (Senokot) 2 tablets twice a day  X To help prevent blood clots, take a baby aspirin (81 mg) twice a day for two weeks after surgery.  You should also get up every hour while you are awake to move around.    Weight Bearing ? Bear weight when you are able on your operated leg or foot. ? Bear weight only on the heel of your operated foot in the post-op shoe. X Do not bear any weight on the operated leg or foot.  Cast / Splint / Dressing X Keep your splint or cast clean and dry.  Dont put anything (coat hanger, pencil, etc) down inside of it.  If it gets damp, use a hair dryer on the cool setting to dry it.  If it gets soaked, call the office to schedule an appointment for a cast change. ? Remove your dressing 3 days after surgery and cover the incisions with dry dressings.    After your dressing, cast or splint is removed; you may shower, but do not soak or scrub the wound.  Allow the water to run over it, and then gently pat it dry.  Swelling It is normal for you to have swelling where you had surgery.  To reduce swelling and pain, keep your toes above your nose for at least 3 days after surgery.  It may be necessary to keep your foot or leg elevated for several weeks.  If it hurts, it should be  elevated.  Follow Up Call my office at [phone] when you are discharged from the hospital or surgery center to schedule an appointment to be seen two weeks after surgery.  Call my office at [phone] if you develop a fever >101.5 F, nausea, vomiting, bleeding from the surgical site or severe pain.    Regional Anesthesia Blocks  1. Numbness or the inability to move the "blocked" extremity may last from 3-48 hours after placement. The length of time depends on the medication injected and your individual response to the medication. If the numbness is not going away after 48 hours, call your surgeon.  2. The extremity that is blocked will need to be protected until the numbness is gone and the  Strength has returned. Because you cannot feel it, you will need to take extra care to avoid injury. Because it may be weak, you may have difficulty moving it or using it. You may not know what position it is in without looking at it while the block is in effect.  3. For blocks in the legs and feet, returning to weight bearing and walking needs to be done carefully. You will need to wait until the numbness is entirely gone and the strength has returned. You should be able to move your leg and foot normally before you try and bear  weight or walk. You will need someone to be with you when you first try to ensure you do not fall and possibly risk injury.  4. Bruising and tenderness at the needle site are common side effects and will resolve in a few days.  5. Persistent numbness or new problems with movement should be communicated to the surgeon or the [facility] [phone] [facility] [phone]).      Post Anesthesia Home Care Instructions  Activity: Get plenty of rest for the remainder of the day. A responsible adult should stay with you for 24 hours following the procedure.  For the next 24 hours, DO NOT: -Drive a car -Advertising copywriter -Drink alcoholic  beverages -Take any medication unless instructed by your physician -Make any legal decisions or sign important papers.  Meals: Start with liquid foods such as gelatin or soup. Progress to regular foods as tolerated. Avoid greasy, spicy, heavy foods. If nausea and/or vomiting occur, drink only clear liquids until the nausea and/or vomiting subsides. Call your physician if vomiting continues.  Special Instructions/Symptoms: Your throat may feel dry or sore from the anesthesia or the breathing tube placed in your throat during surgery. If this causes discomfort, gargle with warm salt water. The discomfort should disappear within 24 hours.  If you had a scopolamine patch placed behind your ear for the management of post- operative nausea and/or vomiting:  1. The medication in the patch is effective for 72 hours, after which it should be removed.  Wrap patch in a tissue and discard in the trash. Wash hands thoroughly with soap and water. 2. You may remove the patch earlier than 72 hours if you experience unpleasant side effects which may include dry mouth, dizziness or visual disturbances. 3. Avoid touching the patch. Wash your hands with soap and water after contact with the patch.

## 2015-10-08 NOTE — Progress Notes (Signed)
Assisted Dr. Rob Fitzgerald with right, ultrasound guided, popliteal block. Side rails up, monitors on throughout procedure. See vital signs in flow sheet. Tolerated Procedure well. 

## 2015-10-08 NOTE — Anesthesia Postprocedure Evaluation (Signed)
Anesthesia Post Note  Patient: Kimberly Cox  Procedure(s) Performed: Procedure(s) (LRB): RIGHT HALLUX METATARSAL PHALANGEAL JOINT  ARTHRODESIS (Right) RIGHT SECOND METATARSAL WEIL HAMMERTOE CORRECTION (Right)  Patient location during evaluation: PACU Anesthesia Type: General and Regional Level of consciousness: awake and alert Pain management: pain level controlled Vital Signs Assessment: post-procedure vital signs reviewed and stable Respiratory status: spontaneous breathing Cardiovascular status: blood pressure returned to baseline Anesthetic complications: no    Last Vitals:  Filed Vitals:   10/08/15 1545 10/08/15 1608  BP: 151/79 157/67  Pulse: 81 76  Temp:  36.3 C  Resp: 16 16    Last Pain:  Filed Vitals:   10/08/15 1610  PainSc: 0-No pain                 Kennieth Rad

## 2015-10-08 NOTE — H&P (Signed)
Kimberly Cox is an 70 y.o. female.   Chief Complaint:  Right forefoot pain HPI: 70 y/o female with right hallux rigidus and 2nd hammertoe.  She has failed no nop treatment and presetns for surgical correction.  Past Medical History  Diagnosis Date  . Anxiety disorder   . Plantar fasciitis   . History of chest pain   . Hyperlipidemia   . Hypertension   . Concussion Dec 13th, 2011    fell, striking her head against a wall, developed dizziness - UC eval c/w concussion. Had follow-up and did ok.   . Anxiety     Past Surgical History  Procedure Laterality Date  . Caesarean section    . Tonsillectomy    . Cesarean section      Family History  Problem Relation Age of Onset  . Cancer Mother   . Coronary artery disease Father   . Breast cancer Neg Hx   . Colon cancer Neg Hx    Social History:  reports that she has quit smoking. She has never used smokeless tobacco. She reports that she does not drink alcohol or use illicit drugs.  Allergies: No Known Allergies  Medications Prior to Admission  Medication Sig Dispense Refill  . Ascorbic Acid (VITAMIN C) 1000 MG tablet Take 1,000 mg by mouth daily.    Marland Kitchen aspirin 81 MG tablet Take 81 mg by mouth daily.      Marland Kitchen b complex vitamins tablet Take 1 tablet by mouth daily.    . Biotin 1000 MCG tablet Take 1,000 mcg by mouth 3 (three) times daily.      . Calcium Carbonate-Vitamin D (CALTRATE 600+D) 600-400 MG-UNIT per tablet Take 1 tablet by mouth daily.      . Coenzyme Q10 (CO Q 10 PO) Take by mouth daily.    Marland Kitchen FOLIC ACID PO Take by mouth daily.    . Lactobacillus (ACIDOPHILUS) 10 MG CAPS Take by mouth.      . losartan-hydrochlorothiazide (HYZAAR) 50-12.5 MG tablet TAKE 1 TABLET BY MOUTH EVERY DAY 90 tablet 3  . Multiple Vitamin (MULTIVITAMIN) tablet Take 1 tablet by mouth daily.    . simvastatin (ZOCOR) 40 MG tablet Take 1 tablet (40 mg total) by mouth at bedtime. 90 tablet 3    No results found for this or any previous visit (from the  past 48 hour(s)). No results found.  ROS  No recent f/c/n/v/wt loss  Blood pressure 114/62, pulse 62, temperature 98.3 F (36.8 C), temperature source Oral, resp. rate 10, height  (1.651 m), weight 70.081 kg (154 lb 8 oz), SpO2 100 %. Physical Exam  wn wd woman in nad.  A and Ox 4.  Mood and affect normal.  EOMI.  resp unlabored.  R foot with decreased ROM at the hallux MPJ.  Skin healthy and intact.  sens to LT normal.  5/5 strength in PF and DF of the ankle and toes.  No lymphadenopathy.  2nd hammertoe is fixed.  Assessment/Plan R hallux rigidus and hammertoe - to OR for hallux MPJ arthrodesis and 2nd hammertoe correction.  The risks and benefits of the alternative treatment options have been discussed in detail.  The patient wishes to proceed with surgery and specifically understands risks of bleeding, infection, nerve damage, blood clots, need for additional surgery, amputation and death.   Toni Arthurs, MD 10/15/15, 1:15 PM

## 2015-10-08 NOTE — Anesthesia Preprocedure Evaluation (Addendum)
Anesthesia Evaluation  Patient identified by MRN, date of birth, ID band Patient awake    Reviewed: Allergy & Precautions, NPO status , Patient's Chart, lab work & pertinent test results  Airway Mallampati: II  TM Distance: >3 FB Neck ROM: Full    Dental   Pulmonary former smoker,    breath sounds clear to auscultation       Cardiovascular hypertension, Pt. on medications  Rhythm:Regular Rate:Normal     Neuro/Psych Anxiety  Neuromuscular disease    GI/Hepatic negative GI ROS, Neg liver ROS,   Endo/Other  negative endocrine ROS  Renal/GU negative Renal ROS     Musculoskeletal   Abdominal   Peds  Hematology negative hematology ROS (+)   Anesthesia Other Findings   Reproductive/Obstetrics                            Anesthesia Physical Anesthesia Plan  ASA: II  Anesthesia Plan: General and Regional   Post-op Pain Management: GA combined w/ Regional for post-op pain   Induction: Intravenous  Airway Management Planned: LMA  Additional Equipment:   Intra-op Plan:   Post-operative Plan: Extubation in OR  Informed Consent: I have reviewed the patients History and Physical, chart, labs and discussed the procedure including the risks, benefits and alternatives for the proposed anesthesia with the patient or authorized representative who has indicated his/her understanding and acceptance.   Dental advisory given  Plan Discussed with: CRNA  Anesthesia Plan Comments:         Anesthesia Quick Evaluation

## 2015-10-08 NOTE — Transfer of Care (Signed)
Immediate Anesthesia Transfer of Care Note  Patient: Kimberly Cox  Procedure(s) Performed: Procedure(s): RIGHT HALLUX METATARSAL PHALANGEAL JOINT  ARTHRODESIS (Right) RIGHT SECOND METATARSAL WEIL HAMMERTOE CORRECTION (Right)  Patient Location: PACU  Anesthesia Type:GA combined with regional for post-op pain  Level of Consciousness: awake and patient cooperative  Airway & Oxygen Therapy: Patient Spontanous Breathing and Patient connected to face mask oxygen  Post-op Assessment: Report given to RN and Post -op Vital signs reviewed and stable  Post vital signs: Reviewed and stable  Last Vitals:  Filed Vitals:   10/08/15 1253 10/08/15 1254  BP:    Pulse: 63 62  Temp:    Resp: 10 10    Complications: No apparent anesthesia complications

## 2015-10-09 ENCOUNTER — Encounter (HOSPITAL_BASED_OUTPATIENT_CLINIC_OR_DEPARTMENT_OTHER): Payer: Self-pay | Admitting: Orthopedic Surgery

## 2015-10-17 LAB — COLOGUARD: COLOGUARD: NEGATIVE

## 2015-10-20 ENCOUNTER — Telehealth: Payer: Self-pay | Admitting: Geriatric Medicine

## 2015-10-20 NOTE — Telephone Encounter (Signed)
Left message for patient to call back. I need to inform her of her negative cologuard results.

## 2015-11-24 ENCOUNTER — Ambulatory Visit
Admission: RE | Admit: 2015-11-24 | Discharge: 2015-11-24 | Disposition: A | Discharge status: Home / Self Care | Payer: BLUE CROSS/BLUE SHIELD | Source: Ambulatory Visit

## 2015-11-24 DIAGNOSIS — Z1231 Encounter for screening mammogram for malignant neoplasm of breast: Secondary | ICD-10-CM

## 2015-11-25 ENCOUNTER — Other Ambulatory Visit: Payer: Self-pay | Admitting: Obstetrics & Gynecology

## 2015-11-25 DIAGNOSIS — R928 Other abnormal and inconclusive findings on diagnostic imaging of breast: Secondary | ICD-10-CM

## 2015-12-01 ENCOUNTER — Ambulatory Visit
Admission: RE | Admit: 2015-12-01 | Discharge: 2015-12-01 | Disposition: A | Discharge status: Home / Self Care | Payer: BLUE CROSS/BLUE SHIELD | Source: Ambulatory Visit | Attending: Obstetrics & Gynecology | Admitting: Obstetrics & Gynecology

## 2015-12-01 DIAGNOSIS — R928 Other abnormal and inconclusive findings on diagnostic imaging of breast: Secondary | ICD-10-CM

## 2015-12-30 DIAGNOSIS — Z4789 Encounter for other orthopedic aftercare: Secondary | ICD-10-CM | POA: Diagnosis not present

## 2016-01-15 ENCOUNTER — Encounter: Payer: Self-pay | Admitting: Geriatric Medicine

## 2016-01-20 ENCOUNTER — Ambulatory Visit (INDEPENDENT_AMBULATORY_CARE_PROVIDER_SITE_OTHER)
Admission: RE | Admit: 2016-01-20 | Discharge: 2016-01-20 | Disposition: A | Discharge status: Home / Self Care | Payer: BLUE CROSS/BLUE SHIELD | Source: Ambulatory Visit | Attending: Internal Medicine | Admitting: Internal Medicine

## 2016-01-20 ENCOUNTER — Ambulatory Visit (INDEPENDENT_AMBULATORY_CARE_PROVIDER_SITE_OTHER): Payer: BLUE CROSS/BLUE SHIELD | Admitting: Internal Medicine

## 2016-01-20 ENCOUNTER — Encounter: Payer: Self-pay | Admitting: Internal Medicine

## 2016-01-20 VITALS — BP 144/82 | HR 74 | Temp 98.1°F | Resp 20 | Wt 157.0 lb

## 2016-01-20 DIAGNOSIS — M545 Low back pain, unspecified: Secondary | ICD-10-CM

## 2016-01-20 DIAGNOSIS — M542 Cervicalgia: Secondary | ICD-10-CM

## 2016-01-20 DIAGNOSIS — I10 Essential (primary) hypertension: Secondary | ICD-10-CM | POA: Diagnosis not present

## 2016-01-20 DIAGNOSIS — M5136 Other intervertebral disc degeneration, lumbar region: Secondary | ICD-10-CM | POA: Diagnosis not present

## 2016-01-20 DIAGNOSIS — M5416 Radiculopathy, lumbar region: Secondary | ICD-10-CM

## 2016-01-20 DIAGNOSIS — M48061 Spinal stenosis, lumbar region without neurogenic claudication: Secondary | ICD-10-CM | POA: Insufficient documentation

## 2016-01-20 DIAGNOSIS — M47816 Spondylosis without myelopathy or radiculopathy, lumbar region: Secondary | ICD-10-CM | POA: Diagnosis not present

## 2016-01-20 MED ORDER — LOSARTAN POTASSIUM-HCTZ 100-12.5 MG PO TABS: 1.0000 | ORAL_TABLET | Freq: Every day | ORAL | Status: DC

## 2016-01-20 NOTE — Patient Instructions (Addendum)
Ok to increase the losartan-HCT to 100-12.5 mg per day (start tomorrow)  Medications work best to take in the AM as the fluid pill part works better during the day  Please return to the LAB only in 1 week to check the kidney function and potassium  Please continue all other medications as before, and refills have been done if requested.  Please have the pharmacy call with any other refills you may need.  Please continue your efforts at being more active, low cholesterol diet, and weight control.  Please consider making appt with Dr Smith/Sports Medicine in this office for next available appt for neck and lower back pain  Please keep your appointments with your specialists as you may have planned  Please go to the XRAY Department in the Basement (go straight as you get off the elevator) for the x-ray testing  Please remember to sign up for MyChart if you have not done so, as this will be important to you in the future with finding out test results, communicating by private email, and scheduling acute appointments online when needed.  Please make an appt to follow up with Dr Okey Duprerawford in 2 weeks for Blood Pressure

## 2016-01-20 NOTE — Progress Notes (Signed)
Pre visit review using our clinic review tool, if applicable. No additional management support is needed unless otherwise documented below in the visit note. 

## 2016-01-20 NOTE — Progress Notes (Signed)
Subjective:    Patient ID: Kimberly Cox, female    DOB: 27-Jan-1946, 70 y.o.   MRN: [id number]  HPI   Here to f/u BP with recent elevations persistent despite stable wt.  Pt denies chest pain, increased sob or doe, wheezing, orthopnea, PND, increased LE swelling, palpitations, dizziness or syncope.  Pt denies new neurological symptoms such as new headache, or facial or extremity weakness or numbness   Pt denies polydipsia, polyuria.  Pt states overall good compliance with meds, trying to follow approp diet,  wt overall stable but little exercise however, as has been inactive due to right foot surgury rehab/recovery x 6 wks. BP Readings from Last 3 Encounters:  01/20/16 144/82  10/08/15 157/67  08/05/15 128/82   Wt Readings from Last 3 Encounters:  01/20/16 157 lb (71.215 kg)  10/08/15 154 lb 8 oz (70.081 kg)  08/05/15 156 lb (70.761 kg)   Pt denies fever, wt loss, night sweats, loss of appetite, or other constitutional symptoms  Pt continues to have recurring LBP without change in severity, bowel or bladder change, fever, wt loss,  worsening LE pain/numbness/weakness, gait change or falls.  Also has pain recurring x 2 mo to right post lateral neck localized without radiation to the occiput, shoulder and is non radicular, is worse to turn head to right or flex neck, has some grinding feeling occasionally.   Past Medical History  Diagnosis Date  . Anxiety disorder   . Plantar fasciitis   . History of chest pain   . Hyperlipidemia   . Hypertension   . Concussion Dec 13th, 2011    fell, striking her head against a wall, developed dizziness - UC eval c/w concussion. Had follow-up and did ok.   . Anxiety    Past Surgical History  Procedure Laterality Date  . Caesarean section    . Tonsillectomy    . Cesarean section    . Arthrodesis metatarsalphalangeal joint (mtpj) Right 10/08/2015    Procedure: RIGHT HALLUX METATARSAL PHALANGEAL JOINT  ARTHRODESIS;  Surgeon: Toni Arthurs, MD;  Location:  [facility];  Service: Orthopedics;  Laterality: Right;  . Hammertoe reconstruction with weil osteotomy Right 10/08/2015    Procedure: RIGHT SECOND METATARSAL WEIL HAMMERTOE CORRECTION;  Surgeon: Toni Arthurs, MD;  Location: [facility];  Service: Orthopedics;  Laterality: Right;    reports that she has quit smoking. She has never used smokeless tobacco. She reports that she does not drink alcohol or use illicit drugs. family history includes Cancer in her mother; Coronary artery disease in her father. There is no history of Breast cancer or Colon cancer. No Known Allergies  Review of Systems  Constitutional: Negative for unusual diaphoresis or night sweats HENT: Negative for ear swelling or discharge Eyes: Negative for worsening visual haziness  Respiratory: Negative for choking and stridor.   Gastrointestinal: Negative for distension or worsening eructation Genitourinary: Negative for retention or change in urine volume.  Musculoskeletal: Negative for other MSK pain or swelling Skin: Negative for color change and worsening wound Neurological: Negative for tremors and numbness other than noted  Psychiatric/Behavioral: Negative for decreased concentration or agitation other than above  ]    Objective:   Physical Exam BP 144/82 mmHg  Pulse 74  Temp(Src) 98.1 F (36.7 C) (Oral)  Resp 20  Wt 157 lb (71.215 kg)  SpO2 97% VS noted, not ill appearing Constitutional: Pt appears in no apparent distress HENT: Head: NCAT.  Right Ear: External ear normal.  Left Ear: External ear normal.  Eyes: . Pupils are equal, round, and reactive to light. Conjunctivae and EOM are normal Neck: Normal range of motion. Neck supple. NT to palpate Cardiovascular: Normal rate and regular rhythm.   Pulmonary/Chest: Effort normal and breath sounds without rales or wheezing.  Neurological: Pt is alert. Not confused , motor 5/5 intact, sens intact to LT, dtr symmetric Skin: Skin is  warm. No rash, no LE edema Psychiatric: Pt behavior is normal. No agitation.  Spine nontender throughout    Assessment & Plan:

## 2016-01-24 NOTE — Assessment & Plan Note (Signed)
Mild uncontrolled, for increased losartan HCT to 100/12.5 qd, f/u BP at home and next visit, o/w stable overall by history and exam, recent data reviewed with pt, and pt to continue low salt diet and other medical treatment as before,  to f/u any worsening symptoms or concerns BP Readings from Last 3 Encounters:  01/20/16 144/82  10/08/15 157/67  08/05/15 128/82

## 2016-01-24 NOTE — Assessment & Plan Note (Signed)
stable overall by history and exam, recent data reviewed with pt, and pt to continue medical treatment as before, not looking for pain med,  to f/u any worsening symptoms or concerns, for ls spine films per pt request, has not been done recent

## 2016-01-24 NOTE — Assessment & Plan Note (Signed)
stable overall by history and exam, recent data reviewed with pt, and pt to continue medical treatment as before, not looking for pain med,  to f/u any worsening symptoms or concerns, for c spine films per pt request, has not been done recent, suspect underlying djd/ddd etiology

## 2016-01-26 ENCOUNTER — Ambulatory Visit (INDEPENDENT_AMBULATORY_CARE_PROVIDER_SITE_OTHER): Payer: BLUE CROSS/BLUE SHIELD | Admitting: Family Medicine

## 2016-01-26 ENCOUNTER — Encounter: Payer: Self-pay | Admitting: Family Medicine

## 2016-01-26 VITALS — BP 114/68 | HR 88 | Ht 65.0 in | Wt 157.0 lb

## 2016-01-26 DIAGNOSIS — M5136 Other intervertebral disc degeneration, lumbar region: Secondary | ICD-10-CM | POA: Diagnosis not present

## 2016-01-26 DIAGNOSIS — M5030 Other cervical disc degeneration, unspecified cervical region: Secondary | ICD-10-CM | POA: Insufficient documentation

## 2016-01-26 MED ORDER — MELOXICAM 15 MG PO TABS: 15.0000 mg | ORAL_TABLET | Freq: Every day | ORAL | Status: DC

## 2016-01-26 NOTE — Assessment & Plan Note (Signed)
Has been chronic in nature. We discussed with patient in great length. We discussed icing regimen and does have anti-inflammatories for breakthrough pain. Work with Event organiserathletic trainer to learn home exercises in greater detail. We discussed proper shoes. Patient will come back and see me again in 4 weeks. At that time if continuing have pain possible formal physical therapy over needed. No radicular symptoms and no weakness I do not think we need to consider advanced imaging at this time.

## 2016-01-26 NOTE — Assessment & Plan Note (Signed)
Severe arthritis of the neck. Given home exercises, discussed over-the-counter medications, given prescription for anti-inflammatory for breakthrough pain when needed. We discussed icing regimen. Discussed which activities to do in which ones to avoid. Discussed proper lifting mechanics. Follow-up again in 4 weeks for further evaluation. If worsening symptoms we'll consider advanced imaging as well as formal physical therapy.

## 2016-01-26 NOTE — Progress Notes (Signed)
Tawana ScaleZach Roiza Wiedel D.O. [facility] [address] [place], [place] [address] Phone: [phone] Subjective:    I'm seeing this patient by the request  of:  Myrlene BrokerElizabeth A Crawford, MD   CC: Neck and back pain  ZHY:[id number]:Subjective Dorinda Hillancy L Mendonsa is a 70 y.o. female coming in with complaint of neck and back pain. Patient is unable to these for quite some time. Patient states that the low back pain she has had for multiple years. Seems to be a chronic condition. Seems to be worsening. Seems to be located on the lower side mostly right greater than left. Patient states that can be severe enough to stop her from activities. Seems worse when changing from this laying to seated position or seated to standing position. When she is up she seems to be doing relatively well. Denies any radiation down the legs any numbness or tingling. Denies any fevers chills or any abnormal weight loss. States still can do daily activities fairly well. States that some mattresses seem to make it worse.   patient recently did have x-rays done. On 01/21/2016 patient did have lumbar spine x-rays that were independently visualized by me showing the patient did have diffuse degenerative disc disease at multiple levels of the entire lumbar spine as well as significant arthritic changes of the sacroiliac joint. Cervical x-rays on the same date shows multilevel degenerative changes mild to moderate in nearly the entire cervical spine.  Past Medical History  Diagnosis Date  . Anxiety disorder   . Plantar fasciitis   . History of chest pain   . Hyperlipidemia   . Hypertension   . Concussion Dec 13th, 2011    fell, striking her head against a wall, developed dizziness - UC eval c/w concussion. Had follow-up and did ok.   . Anxiety    Past Surgical History  Procedure Laterality Date  . Caesarean section    . Tonsillectomy    . Cesarean section    . Arthrodesis metatarsalphalangeal joint (mtpj) Right 10/08/2015    Procedure:  RIGHT HALLUX METATARSAL PHALANGEAL JOINT  ARTHRODESIS;  Surgeon: Toni ArthursJohn Hewitt, MD;  Location: [facility];  Service: Orthopedics;  Laterality: Right;  . Hammertoe reconstruction with weil osteotomy Right 10/08/2015    Procedure: RIGHT SECOND METATARSAL WEIL HAMMERTOE CORRECTION;  Surgeon: Toni ArthursJohn Hewitt, MD;  Location: [facility];  Service: Orthopedics;  Laterality: Right;   Social History   Social History  . Marital Status: Married    Spouse Name: N/A  . Number of Children: N/A  . Years of Education: N/A   Social History Main Topics  . Smoking status: Former Games developermoker  . Smokeless tobacco: Never Used     Comment: only in h.s.  . Alcohol Use: No  . Drug Use: No  . Sexual Activity: Yes    Birth Control/ Protection: Post-menopausal   Other Topics Concern  . None   Social History Narrative   HSG, community college   Married '68   1 daughter- '80, 1 son- '77; two grandchildren   Work: was a Diplomatic Services operational officersecretary, full time homemaker   Marriage in good health   End of care: no cpr, no mechanical ventalation, no futile or heroic measures.   No Known Allergies Family History  Problem Relation Age of Onset  . Cancer Mother   . Coronary artery disease Father   . Breast cancer Neg Hx   . Colon cancer Neg Hx     Past medical history, social, surgical and  family history all reviewed in electronic medical record.  No pertanent information unless stated regarding to the chief complaint.   Review of Systems: No headache, visual changes, nausea, vomiting, diarrhea, constipation, dizziness, abdominal pain, skin rash, fevers, chills, night sweats, weight loss, swollen lymph nodes, body aches, joint swelling, muscle aches, chest pain, shortness of breath, mood changes.   Objective Blood pressure 114/68, pulse 88, height  (1.651 m), weight 157 lb (71.215 kg), SpO2 97 %.  General: No apparent distress alert and oriented x3 mood and affect normal, dressed appropriately.  HEENT:  Pupils equal, extraocular movements intact  Respiratory: Patient's speak in full sentences and does not appear short of breath  Cardiovascular: No lower extremity edema, non tender, no erythema  Skin: Warm dry intact with no signs of infection or rash on extremities or on axial skeleton.  Abdomen: Soft nontender  Neuro: Cranial nerves II through XII are intact, neurovascularly intact in all extremities with 2+ DTRs and 2+ pulses.  Lymph: No lymphadenopathy of posterior or anterior cervical chain or axillae bilaterally.  Gait normal with good balance and coordination.  MSK:  Non tender with full range of motion and good stability and symmetric strength and tone of shoulders, elbows, wrist, hip, knee and ankles bilaterally.  Neck: Inspection shows increase in lordosis at the lower cervical spine No palpable stepoffs. Negative Spurling's maneuver. Knee range of motion lacking the last 10 of extension as well as the last 5 of flexion. Patient is minimal sidebending bilaterally. Crepitus noted with range of motion with rotation but nearly full. Grip strength and sensation normal in bilateral hands Strength good C4 to T1 distribution No sensory change to C4 to T1 Negative Hoffman sign bilaterally Reflexes normal  Back Exam:  Inspection: Scoliosis noted  Motion: Flexion 25 deg, Extension 25 deg, Side Bending to 25 deg bilaterally,  Rotation to 25 deg bilaterally  SLR laying: Negative  XSLR laying: Negative  Palpable tenderness: Tenderness mostly over the SI joints bilaterally FABER: Positive bilaterally left greater than right Sensory change: Gross sensation intact to all lumbar and sacral dermatomes.  Reflexes: 2+ at both patellar tendons, 2+ at achilles tendons, Babinski's downgoing.  Strength at foot  Plantar-flexion: 5/5 Dorsi-flexion: 5/5 Eversion: 5/5 Inversion: 5/5  Leg strength  Leg strength 4 out of 5 but symmetric  Procedure note 97110; 15 minutes spent for Therapeutic  exercises as stated in above notes.  This included exercises focusing on stretching, strengthening, with significant focus on eccentric aspects. Low back exercises that included:  Pelvic tilt/bracing instruction to focus on control of the pelvic girdle and lower abdominal muscles  Glute strengthening exercises, focusing on proper firing of the glutes without engaging the low back muscles Proper stretching techniques for maximum relief for the hamstrings, hip flexors, low back and some rotation where tolerated   Proper technique shown and discussed handout in great detail with ATC.  All questions were discussed and answered.     Impression and Recommendations:     This case required medical decision making of moderate complexity.      Note: This dictation was prepared with Dragon dictation along with smaller phrase technology. Any transcriptional errors that result from this process are unintentional.

## 2016-01-26 NOTE — Patient Instructions (Signed)
Good to see yo u Ice 20 minutes 2 times daily. Usually after activity and before bed. Exercises 3 times a week.  Alternate neck and back Vitamin D 2000 IU daily total (can go up to 4000IU dialy if easier to find) Fish oil 2 grams daily.  Tumeric 500mg  twice daily.  Capsaicin topically up to four times a day may also help with pain. It's important that you continue to stay active. Controlling your weight is important.  Good shoes with rigid bottom.  Dierdre HarnessKeen, Dansko, Merrell or New balance greater then 700 Shoe inserts with good arch support may be helpful.  [employer] could help.  Water aerobics and cycling with low resistance are the best two types of exercise for arthritis. Come back and see me in 4 weeks.

## 2016-01-26 NOTE — Progress Notes (Signed)
Pre visit review using our clinic review tool, if applicable. No additional management support is needed unless otherwise documented below in the visit note. 

## 2016-01-27 ENCOUNTER — Other Ambulatory Visit (INDEPENDENT_AMBULATORY_CARE_PROVIDER_SITE_OTHER): Payer: BLUE CROSS/BLUE SHIELD

## 2016-01-27 DIAGNOSIS — I10 Essential (primary) hypertension: Secondary | ICD-10-CM

## 2016-01-27 LAB — BASIC METABOLIC PANEL
BUN: 22 mg/dL (ref 6–23)
CHLORIDE: 104 meq/L (ref 96–112)
CO2: 32 mEq/L (ref 19–32)
CREATININE: 0.78 mg/dL (ref 0.40–1.20)
Calcium: 10.1 mg/dL (ref 8.4–10.5)
GFR: 78.04 mL/min (ref >60.00)
Glucose, Bld: 113 mg/dL — ABNORMAL HIGH (ref 70–99)
POTASSIUM: 3.6 meq/L (ref 3.5–5.1)
Sodium: 141 mEq/L (ref 135–145)

## 2016-02-02 ENCOUNTER — Encounter: Payer: Self-pay | Admitting: Internal Medicine

## 2016-02-10 ENCOUNTER — Ambulatory Visit: Payer: BLUE CROSS/BLUE SHIELD | Admitting: Family

## 2016-02-18 ENCOUNTER — Ambulatory Visit: Payer: BLUE CROSS/BLUE SHIELD | Admitting: Family Medicine

## 2016-02-23 ENCOUNTER — Other Ambulatory Visit: Payer: Self-pay | Admitting: Family Medicine

## 2016-03-24 ENCOUNTER — Other Ambulatory Visit: Payer: Self-pay | Admitting: Family Medicine

## 2016-04-28 ENCOUNTER — Other Ambulatory Visit: Payer: Self-pay | Admitting: Family Medicine

## 2016-04-28 NOTE — Telephone Encounter (Signed)
Refill done.  

## 2016-06-23 DIAGNOSIS — Z23 Encounter for immunization: Secondary | ICD-10-CM | POA: Diagnosis not present

## 2016-07-12 DIAGNOSIS — H5213 Myopia, bilateral: Secondary | ICD-10-CM | POA: Diagnosis not present

## 2016-07-12 DIAGNOSIS — H2513 Age-related nuclear cataract, bilateral: Secondary | ICD-10-CM | POA: Diagnosis not present

## 2016-07-12 DIAGNOSIS — H353111 Nonexudative age-related macular degeneration, right eye, early dry stage: Secondary | ICD-10-CM | POA: Diagnosis not present

## 2016-07-12 DIAGNOSIS — H33302 Unspecified retinal break, left eye: Secondary | ICD-10-CM | POA: Diagnosis not present

## 2016-07-13 ENCOUNTER — Other Ambulatory Visit: Payer: Self-pay | Admitting: Internal Medicine

## 2016-10-07 ENCOUNTER — Other Ambulatory Visit: Payer: Self-pay | Admitting: Internal Medicine

## 2016-11-03 DIAGNOSIS — H6503 Acute serous otitis media, bilateral: Secondary | ICD-10-CM | POA: Diagnosis not present

## 2016-11-03 DIAGNOSIS — J309 Allergic rhinitis, unspecified: Secondary | ICD-10-CM | POA: Diagnosis not present

## 2016-11-10 DIAGNOSIS — L602 Onychogryphosis: Secondary | ICD-10-CM | POA: Diagnosis not present

## 2016-11-10 DIAGNOSIS — R238 Other skin changes: Secondary | ICD-10-CM | POA: Diagnosis not present

## 2016-11-14 ENCOUNTER — Other Ambulatory Visit: Payer: Self-pay | Admitting: Obstetrics & Gynecology

## 2016-11-14 DIAGNOSIS — Z1231 Encounter for screening mammogram for malignant neoplasm of breast: Secondary | ICD-10-CM

## 2016-11-23 DIAGNOSIS — Z01419 Encounter for gynecological examination (general) (routine) without abnormal findings: Secondary | ICD-10-CM | POA: Diagnosis not present

## 2016-11-23 DIAGNOSIS — Z1151 Encounter for screening for human papillomavirus (HPV): Secondary | ICD-10-CM | POA: Diagnosis not present

## 2016-11-23 DIAGNOSIS — Z6825 Body mass index (BMI) 25.0-25.9, adult: Secondary | ICD-10-CM | POA: Diagnosis not present

## 2016-11-30 DIAGNOSIS — L602 Onychogryphosis: Secondary | ICD-10-CM | POA: Diagnosis not present

## 2016-12-05 ENCOUNTER — Other Ambulatory Visit: Payer: Self-pay | Admitting: Obstetrics & Gynecology

## 2016-12-05 DIAGNOSIS — Z780 Asymptomatic menopausal state: Secondary | ICD-10-CM

## 2016-12-12 ENCOUNTER — Ambulatory Visit (INDEPENDENT_AMBULATORY_CARE_PROVIDER_SITE_OTHER): Payer: BLUE CROSS/BLUE SHIELD

## 2016-12-12 ENCOUNTER — Other Ambulatory Visit: Payer: Self-pay | Admitting: Obstetrics & Gynecology

## 2016-12-12 ENCOUNTER — Ambulatory Visit (INDEPENDENT_AMBULATORY_CARE_PROVIDER_SITE_OTHER): Payer: BLUE CROSS/BLUE SHIELD | Admitting: Obstetrics & Gynecology

## 2016-12-12 ENCOUNTER — Encounter: Payer: Self-pay | Admitting: Obstetrics & Gynecology

## 2016-12-12 VITALS — BP 124/72

## 2016-12-12 DIAGNOSIS — Z780 Asymptomatic menopausal state: Secondary | ICD-10-CM | POA: Diagnosis not present

## 2016-12-12 DIAGNOSIS — R102 Pelvic and perineal pain: Secondary | ICD-10-CM

## 2016-12-12 NOTE — Progress Notes (Signed)
    Kimberly Cox 15-May-1946 [id number]        71 y.o.  Menopausal.  Presenting d/t Pelvic discomfort for Pelvic US.  Since last visit at [facility], discomfort has improved.  No PMB.    Past medical history,surgical history, problem list, medications, allergies, family history and social history were all reviewed and documented in the EPIC chart.  Directed ROS with pertinent positives and negatives documented in the history of present illness/assessment and plan.  Exam:  Vitals:   12/12/16 1212  BP: 124/72   General appearance:  Normal   Pelvic US:  Uterus wnl, Endometrial line thin at 2+ mm.  Ovaries wnl, no cyst, no mass, no increased BF.  Small Ca++ present.  No FF.  Assessment/Plan:  71 y.o.   1. Female pelvic pain with Normal Pelvic US.  US findings discussed and explained.  Patient feels that discomfort has improved.  Possible GI origin.  Overdue for Colonoscopy.  Will schedule with GI.  Counseling >50% 15 min.   [name] MD, 1:09 PM 12/12/2016

## 2016-12-12 NOTE — Patient Instructions (Signed)
Normal pelvic US.  Recommend scheduling her Colonoscopy.

## 2016-12-14 ENCOUNTER — Ambulatory Visit
Admission: RE | Admit: 2016-12-14 | Discharge: 2016-12-14 | Disposition: A | Discharge status: Home / Self Care | Payer: BLUE CROSS/BLUE SHIELD | Source: Ambulatory Visit | Attending: Obstetrics & Gynecology | Admitting: Obstetrics & Gynecology

## 2016-12-14 DIAGNOSIS — M6701 Short Achilles tendon (acquired), right ankle: Secondary | ICD-10-CM | POA: Diagnosis not present

## 2016-12-14 DIAGNOSIS — R200 Anesthesia of skin: Secondary | ICD-10-CM | POA: Diagnosis not present

## 2016-12-14 DIAGNOSIS — M7741 Metatarsalgia, right foot: Secondary | ICD-10-CM | POA: Diagnosis not present

## 2016-12-14 DIAGNOSIS — Z1231 Encounter for screening mammogram for malignant neoplasm of breast: Secondary | ICD-10-CM

## 2016-12-14 LAB — HM MAMMOGRAPHY

## 2016-12-21 ENCOUNTER — Other Ambulatory Visit: Payer: Self-pay | Admitting: Internal Medicine

## 2016-12-21 LAB — HM PAP SMEAR

## 2016-12-23 ENCOUNTER — Telehealth: Payer: Self-pay | Admitting: Podiatry

## 2016-12-23 NOTE — Telephone Encounter (Signed)
I called [facility] per patient's request to see if they could find her paper chart from our old system by just her name and date of birth. They were unable to find her paper chart by that information. I called the patient and left her a voicemail to let her know that we do not have access to that chart since we do not have her old chart number or the box number the chart is in. Told patient she could call back with any other questions.

## 2016-12-29 ENCOUNTER — Telehealth: Payer: Self-pay | Admitting: *Deleted

## 2016-12-29 NOTE — Telephone Encounter (Signed)
Pt called to follow up from [facility] on 12/12/16, had normal ultrasound asked if CA-125 should be ordered as she still has some discomfort. I explained to patient no ca-15 needed for normal ultrasound, per note pt will need to follow up with GI if symptoms continues. Pt has appointment scheduled in may.

## 2017-01-04 ENCOUNTER — Other Ambulatory Visit: Payer: Self-pay | Admitting: Internal Medicine

## 2017-01-06 LAB — HM DEXA SCAN

## 2017-01-09 ENCOUNTER — Telehealth: Payer: Self-pay | Admitting: Internal Medicine

## 2017-01-09 NOTE — Telephone Encounter (Signed)
Patient is requesting to transfer from Dr. Crawford to Dr. Jones.  Please advise.  °

## 2017-01-09 NOTE — Telephone Encounter (Signed)
yes

## 2017-01-10 NOTE — Telephone Encounter (Signed)
Got appt scheduled  °

## 2017-01-10 NOTE — Telephone Encounter (Signed)
Fine

## 2017-01-14 ENCOUNTER — Other Ambulatory Visit: Payer: Self-pay | Admitting: Internal Medicine

## 2017-01-17 LAB — BASIC METABOLIC PANEL
BUN: 17 mg/dL (ref 4–21)
CREATININE: 0.8 mg/dL (ref 0.5–1.1)
GLUCOSE: 109 mg/dL
POTASSIUM: 3.3 mmol/L — AB (ref 3.4–5.3)
Sodium: 141 mmol/L (ref 137–147)

## 2017-01-17 LAB — LIPID PANEL
CHOLESTEROL: 140 mg/dL (ref 0–200)
HDL: 50 mg/dL (ref 35–70)
LDL Cholesterol: 60 mg/dL
TRIGLYCERIDES: 110 mg/dL (ref 40–160)

## 2017-01-17 LAB — CBC AND DIFFERENTIAL
HCT: 42 % (ref 36–46)
Hemoglobin: 14.2 g/dL (ref 12.0–16.0)
Platelets: 217 10*3/uL (ref 150–399)
WBC: 4.9 10*3/mL

## 2017-01-17 LAB — TSH: TSH: 2.22 u[IU]/mL (ref 0.41–5.90)

## 2017-01-17 LAB — HEPATIC FUNCTION PANEL
ALT: 30 U/L (ref 7–35)
AST: 21 U/L (ref 13–35)
Alkaline Phosphatase: 42 U/L (ref 25–125)
Bilirubin, Total: 0.5 mg/dL

## 2017-01-17 LAB — VITAMIN D 25 HYDROXY (VIT D DEFICIENCY, FRACTURES): Vit D, 25-Hydroxy: 61

## 2017-01-24 DIAGNOSIS — G8929 Other chronic pain: Secondary | ICD-10-CM | POA: Diagnosis not present

## 2017-01-24 DIAGNOSIS — R200 Anesthesia of skin: Secondary | ICD-10-CM | POA: Diagnosis not present

## 2017-01-24 DIAGNOSIS — M545 Low back pain: Secondary | ICD-10-CM | POA: Diagnosis not present

## 2017-01-24 DIAGNOSIS — M542 Cervicalgia: Secondary | ICD-10-CM | POA: Diagnosis not present

## 2017-02-02 ENCOUNTER — Ambulatory Visit (INDEPENDENT_AMBULATORY_CARE_PROVIDER_SITE_OTHER): Payer: BLUE CROSS/BLUE SHIELD | Admitting: Internal Medicine

## 2017-02-02 ENCOUNTER — Encounter: Payer: Self-pay | Admitting: Internal Medicine

## 2017-02-02 VITALS — BP 122/70 | HR 74 | Temp 98.3°F | Ht 65.0 in | Wt 158.0 lb

## 2017-02-02 DIAGNOSIS — M545 Low back pain: Secondary | ICD-10-CM | POA: Diagnosis not present

## 2017-02-02 DIAGNOSIS — R7303 Prediabetes: Secondary | ICD-10-CM | POA: Insufficient documentation

## 2017-02-02 DIAGNOSIS — E785 Hyperlipidemia, unspecified: Secondary | ICD-10-CM | POA: Diagnosis not present

## 2017-02-02 DIAGNOSIS — E781 Pure hyperglyceridemia: Secondary | ICD-10-CM | POA: Diagnosis not present

## 2017-02-02 DIAGNOSIS — R739 Hyperglycemia, unspecified: Secondary | ICD-10-CM | POA: Insufficient documentation

## 2017-02-02 DIAGNOSIS — I10 Essential (primary) hypertension: Secondary | ICD-10-CM | POA: Diagnosis not present

## 2017-02-02 DIAGNOSIS — G8929 Other chronic pain: Secondary | ICD-10-CM | POA: Diagnosis not present

## 2017-02-02 DIAGNOSIS — E876 Hypokalemia: Secondary | ICD-10-CM | POA: Insufficient documentation

## 2017-02-02 MED ORDER — LOSARTAN POTASSIUM-HCTZ 100-12.5 MG PO TABS: 1.0000 | ORAL_TABLET | Freq: Every day | ORAL | 1 refills | Status: DC

## 2017-02-02 NOTE — Patient Instructions (Signed)

## 2017-02-02 NOTE — Progress Notes (Signed)
Subjective:  Patient ID: Kimberly Cox, female    DOB: [birth year]  Age: 71 y.o. MRN: [id number]  CC: Hypertension and Back Pain   HPI Kimberly Cox presents for Establishing as a new patient. She tells me the last month she was seen at [facility] for a complete executive physical. She was told during that time that she suffers from anxiety and depression and Effexor was started. She tells me that she is not tolerating the Effexor well - that it has worsened her insomnia and makes her feel jittery. She is on the 37.5 mg dose and may decide to stop taking this in the future. She also tells me that she had a bone density test done but was not advised that she needs to treat osteoporosis. She also tells me that she underwent an exercise tolerance test and that the results were normal. In addition she saw an orthopedist for chronic back pain but was told that there is no surgical option or intervention that is required. She was also told that her blood sugar was mildly elevated and she may have prediabetes. She wants a referral to physical therapy to help with her lower back pain. She has chronic, unchanged numbness in both feet, worse on the right than the left. This was evaluated many years ago and was found to be hereditary or idiopathic. No causative factor has been identified in the lower extremity neuropathy. She tells me the orthopedist told her it may be related to her lower back.  History Kimberly Cox has a past medical history of Anxiety; Anxiety disorder; Concussion (Dec 13th, 2011); History of chest pain; Hyperlipidemia; Hypertension; and Plantar fasciitis.   She has a past surgical history that includes caesarean section; Tonsillectomy; Cesarean section; Arthrodesis metatarsalphalangeal joint (mtpj) (Right, 10/08/2015); and Hammertoe reconstruction with weil osteotomy (Right, 10/08/2015).   Her family history includes Cancer in her mother; Coronary artery disease in her father.She reports that she has quit  smoking. She has never used smokeless tobacco. She reports that she drinks alcohol. She reports that she does not use drugs.  Outpatient Medications Prior to Visit  Medication Sig Dispense Refill  . aspirin 81 MG tablet Take 81 mg by mouth daily.      Marland Kitchen b complex vitamins tablet Take 1 tablet by mouth daily.    . Biotin 1000 MCG tablet Take 5,000 mcg by mouth daily.     . Lactobacillus (ACIDOPHILUS) 10 MG CAPS Take by mouth.      . Omega-3 Fatty Acids (FISH OIL) 1000 MG CAPS Take 2,000 mg by mouth.    . Coenzyme Q10 (CO Q 10 PO) Take by mouth daily.    Marland Kitchen losartan-hydrochlorothiazide (HYZAAR) 100-12.5 MG tablet Take 1 tablet by mouth daily. YEARLY PHYSICAL W/LABS DUE IN MAY NEED OV FOR REFILLS 30 tablet 0  . Multiple Vitamin (MULTIVITAMIN) tablet Take 1 tablet by mouth daily.    . Calcium Carbonate-Vitamin D (CALTRATE 600+D) 600-400 MG-UNIT per tablet Take 1 tablet by mouth daily.      . cholecalciferol (VITAMIN D) 1000 units tablet Take 1,000 Units by mouth daily.    Marland Kitchen FOLIC ACID PO Take by mouth daily.    . simvastatin (ZOCOR) 40 MG tablet TAKE 1 TABLET(40 MG) BY MOUTH AT BEDTIME 30 tablet 0   No facility-administered medications prior to visit.     ROS Review of Systems  Constitutional: Negative.  Negative for appetite change, diaphoresis, fatigue and unexpected weight change.  HENT: Negative.  Negative for  trouble swallowing.   Eyes: Negative.  Negative for visual disturbance.  Respiratory: Negative.  Negative for cough, chest tightness, shortness of breath and wheezing.   Cardiovascular: Negative.  Negative for chest pain, palpitations and leg swelling.  Gastrointestinal: Negative.  Negative for abdominal pain.  Endocrine: Negative.  Negative for polydipsia, polyphagia and polyuria.  Genitourinary: Negative.  Negative for difficulty urinating.  Musculoskeletal: Positive for back pain.  Skin: Negative.   Neurological: Positive for numbness. Negative for dizziness, tremors,  weakness, light-headedness and headaches.  Hematological: Negative for adenopathy. Does not bruise/bleed easily.  Psychiatric/Behavioral: Positive for dysphoric mood and sleep disturbance. Negative for agitation, decreased concentration, self-injury and suicidal ideas. The patient is nervous/anxious.     Objective:  BP 122/70 (BP Location: Left Arm, Patient Position: Sitting, Cuff Size: Normal)   Pulse 74   Temp 98.3 F (36.8 C) (Oral)   Ht 5\' 5"  (1.651 m)   Wt 158 lb (71.7 kg)   SpO2 100%   BMI 26.29 kg/m   Physical Exam  Constitutional: She is oriented to person, place, and time. No distress.  HENT:  Mouth/Throat: Oropharynx is clear and moist. No oropharyngeal exudate.  Eyes: Conjunctivae are normal. Right eye exhibits no discharge. Left eye exhibits no discharge. No scleral icterus.  Neck: Normal range of motion. Neck supple. No thyromegaly present.  Cardiovascular: Normal rate, regular rhythm and normal heart sounds.   No murmur heard. Pulmonary/Chest: Effort normal and breath sounds normal. No respiratory distress. She has no wheezes. She has no rales. She exhibits no tenderness.  Abdominal: Soft. Bowel sounds are normal. She exhibits no distension and no mass. There is no tenderness. There is no rebound and no guarding.  Musculoskeletal: Normal range of motion. She exhibits no edema, tenderness or deformity.  Lymphadenopathy:    She has no cervical adenopathy.  Neurological: She is alert and oriented to person, place, and time. She displays no atrophy, no tremor and normal reflexes. No cranial nerve deficit or sensory deficit. She exhibits normal muscle tone. She displays a negative Romberg sign. She displays no seizure activity. Coordination and gait normal.  Reflex Scores:      Tricep reflexes are 1+ on the right side and 1+ on the left side.      Bicep reflexes are 1+ on the right side and 1+ on the left side.      Brachioradialis reflexes are 1+ on the right side and 1+ on  the left side.      Patellar reflexes are 1+ on the right side and 1+ on the left side.      Achilles reflexes are 0 on the right side and 0 on the left side. Skin: Skin is warm and dry. No rash noted. She is not diaphoretic. No erythema. No pallor.  Psychiatric: She has a normal mood and affect. Her behavior is normal. Judgment and thought content normal.  Vitals reviewed.    Lab Results  Component Value Date   WBC 4.9 01/17/2017   HGB 14.2 01/17/2017   HCT 42 01/17/2017   PLT 217 01/17/2017   GLUCOSE 113 (H) 01/27/2016   CHOL 140 01/17/2017   TRIG 110 01/17/2017   HDL 50 01/17/2017   LDLDIRECT 108.0 06/30/2015   LDLCALC 60 01/17/2017   ALT 30 01/17/2017   AST 21 01/17/2017   NA 141 01/17/2017   K 3.3 (A) 01/17/2017   CL 104 01/27/2016   CREATININE 0.8 01/17/2017   BUN 17 01/17/2017   CO2 32 01/27/2016  TSH 2.22 01/17/2017   HGBA1C 5.7 06/30/2015     Assessment & Plan:   [name] was seen today for hypertension and back pain.  Diagnoses and all orders for this visit:  Essential hypertension- her blood pressure is well-controlled, I will monitor her electrolytes and renal function. -     Comprehensive metabolic panel; Future -     Cancel: CBC with Differential/Platelet; Future -     losartan-hydrochlorothiazide (HYZAAR) 100-12.5 MG tablet; Take 1 tablet by mouth daily. -     Magnesium; Future  Prediabetes- will check an A1c to see if she is indeed prediabetic or as developed type 2 diabetes mellitus. In the meantime she will work on her lifestyle modifications. -     Hemoglobin A1c; Future  Pure hyperglyceridemia- improvement noted on her recent fasting lipid panel -     Cancel: Lipid panel; Future  Hyperlipidemia LDL goal <130- She has achieved her LDL goal is doing well on the statin. -     Cancel: Lipid panel; Future -     Cancel: TSH; Future  Chronic bilateral low back pain without sciatica -     Ambulatory referral to Physical Therapy  Hypokalemia- I will  recheck her potassium level and will treat if indicated, will also check her magnesium level. -     Magnesium; Future   I have discontinued Ms. Millspaugh's Calcium Carbonate-Vitamin D, multivitamin, Coenzyme Q10 (CO Q 10 PO), FOLIC ACID PO, cholecalciferol, and simvastatin. I have also changed her losartan-hydrochlorothiazide. Additionally, I am having her maintain her ACIDOPHILUS, aspirin, Biotin, b complex vitamins, Fish Oil, rosuvastatin, and venlafaxine XR.  Meds ordered this encounter  Medications  . rosuvastatin (CRESTOR) 5 MG tablet    Refill:  3  . venlafaxine XR (EFFEXOR-XR) 37.5 MG 24 hr capsule    Refill:  3  . losartan-hydrochlorothiazide (HYZAAR) 100-12.5 MG tablet    Sig: Take 1 tablet by mouth daily.    Dispense:  90 tablet    Refill:  1     Follow-up: Return in about 6 months (around 08/04/2017).  Sanda Lingerhomas Safwan Tomei, MD

## 2017-02-21 ENCOUNTER — Telehealth: Payer: Self-pay | Admitting: Physical Therapy

## 2017-02-21 NOTE — Telephone Encounter (Signed)
Left message to call to schedule PT eval on 6/11 & 02/20/17. No return call

## 2017-06-27 ENCOUNTER — Ambulatory Visit: Payer: BLUE CROSS/BLUE SHIELD | Attending: Internal Medicine | Admitting: Physical Therapy

## 2017-06-27 ENCOUNTER — Encounter: Payer: Self-pay | Admitting: Physical Therapy

## 2017-06-27 DIAGNOSIS — M5441 Lumbago with sciatica, right side: Secondary | ICD-10-CM | POA: Diagnosis not present

## 2017-06-27 DIAGNOSIS — M5442 Lumbago with sciatica, left side: Secondary | ICD-10-CM | POA: Insufficient documentation

## 2017-06-27 NOTE — Therapy (Signed)
[facility] [address] [place], [place], [address] Phone: [phone]   Fax:  [phone]  Physical Therapy Evaluation  Patient Details  Name: Kimberly Cox MRN: [id number] Date of Birth: 22-Jul-1946 Referring Provider: Santa Generaommy Jones  Encounter Date: 06/27/2017      PT End of Session - 06/27/17 1213    Visit Number 1   Date for PT Re-Evaluation 08/27/17   PT Start Time 1010   PT Stop Time 1100   PT Time Calculation (min) 50 min   Activity Tolerance Patient tolerated treatment well   Behavior During Therapy [facility] for tasks assessed/performed      Past Medical History:  Diagnosis Date  . Anxiety   . Anxiety disorder   . Concussion Dec 13th, 2011   fell, striking her head against a wall, developed dizziness - UC eval c/w concussion. Had follow-up and did ok.   . History of chest pain   . Hyperlipidemia   . Hypertension   . Plantar fasciitis     Past Surgical History:  Procedure Laterality Date  . ARTHRODESIS METATARSALPHALANGEAL JOINT (MTPJ) Right 10/08/2015   Procedure: RIGHT HALLUX METATARSAL PHALANGEAL JOINT  ARTHRODESIS;  Surgeon: Toni ArthursJohn Hewitt, MD;  Location: [facility];  Service: Orthopedics;  Laterality: Right;  . caesarean section    . CESAREAN SECTION    . HAMMERTOE RECONSTRUCTION WITH WEIL OSTEOTOMY Right 10/08/2015   Procedure: RIGHT SECOND METATARSAL WEIL HAMMERTOE CORRECTION;  Surgeon: Toni ArthursJohn Hewitt, MD;  Location: [facility];  Service: Orthopedics;  Laterality: Right;  . TONSILLECTOMY      There were no vitals filed for this visit.       Subjective Assessment - 06/27/17 1019    Subjective Patient reports that she has had dome LBP for a number of years with some numbness in the legs after a foot surgery.  X-rays show DDD.   Limitations Standing   Patient Stated Goals have less pain   Currently in Pain? Yes   Pain Score 1    Pain Location Back   Pain Orientation  Right;Left;Lower   Pain Descriptors / Indicators Aching   Pain Type Acute pain   Pain Radiating Towards pain down the posterior legs to the feet, c/o numness int he feet   Pain Onset More than a month ago   Pain Frequency Intermittent   Aggravating Factors  standing, trying to sleep at night pain and numbness up to 5/10   Pain Relieving Factors stretches help, pain can be 0/10   Effect of Pain on Daily Activities just irritating            [facility] - 06/27/17 0001      Assessment   Medical Diagnosis LBP   Referring Provider Santa Generaommy Jones   Onset Date/Surgical Date 05/28/17   Prior Therapy no     Precautions   Precautions None     Balance Screen   Has the patient fallen in the past 6 months No   Has the patient had a decrease in activity level because of a fear of falling?  No   Is the patient reluctant to leave their home because of a fear of falling?  No     Home Environment   Additional Comments light housework, lift a grand child     Prior Function   Level of Independence Independent   Vocation Retired   Leisure no exercise     Metallurgistosture/Postural Control  Posture Comments fwd head, rounded shoulders, left hip is higher, left bulge in the left parapsinals. scoliosis, left rib hump with fwd flexion, left iliac crest is higher than the right     ROM / Strength   AROM / PROM / Strength AROM;Strength     AROM   Overall AROM Comments lumbar ROM decreased 50% for all motions with c/o stiffness     Strength   Overall Strength Comments hips 4-/5     Flexibility   Soft Tissue Assessment /Muscle Length yes   Hamstrings very tight 45 degrees SLR   ITB very tight   Piriformis very tight     Palpation   Palpation comment tightness in the lumbar paraspinals            Objective measurements completed on examination: See above findings.                  PT Education - 2017/06/29 Jan 14, 1211    Education provided Yes   Education Details LE  flexibility   Person(s) Educated Patient   Methods Explanation;Demonstration;Handout   Comprehension Verbalized understanding          PT Short Term Goals - 29-Jun-2017 1216      PT SHORT TERM GOAL #1   Title independent iwth initial HEP   Time 2   Period Weeks   Status New           PT Long Term Goals - 2017/06/29 1216      PT LONG TERM GOAL #1   Title understand proper posture and body mechanics   Time 8   Period Weeks   Status New     PT LONG TERM GOAL #2   Title increase lumbar ROM 25%   Time 8   Period Weeks   Status New     PT LONG TERM GOAL #3   Title decrease pain/numbness 25%   Time 8   Period Weeks   Status New                Plan - 06-29-2017 1214    Clinical Impression Statement Patient reports some back pain for a number of years, reports that she mostly has numbness in the legs and feet.  Reports that NCV tests were negative.  She has scoliosis, she is very tight in the LE's, weak in the hips and the core.  X-rays show DDD.   Clinical Presentation Stable   Clinical Decision Making Low   Rehab Potential Good   PT Frequency 2x / week   PT Duration 8 weeks   PT Treatment/Interventions ADLs/Self Care Home Management;Electrical Stimulation;Moist Heat;Traction;Therapeutic exercise;Therapeutic activities;Patient/family education;Manual techniques   PT Next Visit Plan start gym activities, may try traction   Consulted and Agree with Plan of Care Patient      Patient will benefit from skilled therapeutic intervention in order to improve the following deficits and impairments:  Decreased strength, Postural dysfunction, Improper body mechanics, Impaired flexibility, Pain, Increased muscle spasms, Decreased range of motion, Difficulty walking  Visit Diagnosis: Acute bilateral low back pain with bilateral sciatica - Plan: PT plan of care cert/re-cert      G-Codes - 06/29/2017 Jan 13, 1218    Functional Assessment Tool Used (Outpatient Only) foto 44% limitation    Functional Limitation Other PT primary   Other PT Primary Current Status (Z3664) At least 40 percent but less than 60 percent impaired, limited or restricted   Other PT Primary Goal Status (Q0347) At least 20 percent  but less than 40 percent impaired, limited or restricted       Problem List Patient Active Problem List   Diagnosis Date Noted  . Prediabetes 02/02/2017  . Pure hyperglyceridemia 02/02/2017  . Hypokalemia 02/02/2017  . Degenerative cervical disc 01/26/2016  . Degenerative lumbar disc 01/26/2016  . Low back pain 01/20/2016  . Routine general medical examination at a health care facility 07/18/2014  . Hereditary and idiopathic peripheral neuropathy 01/31/2008  . SHOULDER PAIN, LEFT 01/31/2008  . Hyperlipidemia LDL goal <130 12/05/2007  . Essential hypertension 12/05/2007    Jearld Lesch., PT 06/27/2017, 12:21 PM  [facility] [address] [place], [place], [address] Phone: [phone]   Fax:  [phone]  Name: Kimberly Cox MRN: [id number] Date of Birth: [birth year]

## 2017-07-04 ENCOUNTER — Encounter: Payer: Self-pay | Admitting: Physical Therapy

## 2017-07-04 ENCOUNTER — Ambulatory Visit: Payer: BLUE CROSS/BLUE SHIELD | Admitting: Physical Therapy

## 2017-07-04 DIAGNOSIS — M5441 Lumbago with sciatica, right side: Secondary | ICD-10-CM

## 2017-07-04 DIAGNOSIS — M5442 Lumbago with sciatica, left side: Secondary | ICD-10-CM | POA: Diagnosis not present

## 2017-07-04 NOTE — Therapy (Signed)
[facility] [address] [place], [place], [address] Phone: [phone]   Fax:  [phone]  Physical Therapy Treatment  Patient Details  Name: Kimberly Cox MRN: [id number] Date of Birth: 21-Apr-1946 Referring Provider: Santa Generaommy Jones  Encounter Date: 07/04/2017      PT End of Session - 07/04/17 0844    Visit Number 2   Date for PT Re-Evaluation 08/27/17   PT Start Time 0800   PT Stop Time 0844   PT Time Calculation (min) 44 min   Activity Tolerance Patient tolerated treatment well   Behavior During Therapy [facility] for tasks assessed/performed      Past Medical History:  Diagnosis Date  . Anxiety   . Anxiety disorder   . Concussion Dec 13th, 2011   fell, striking her head against a wall, developed dizziness - UC eval c/w concussion. Had follow-up and did ok.   . History of chest pain   . Hyperlipidemia   . Hypertension   . Plantar fasciitis     Past Surgical History:  Procedure Laterality Date  . ARTHRODESIS METATARSALPHALANGEAL JOINT (MTPJ) Right 10/08/2015   Procedure: RIGHT HALLUX METATARSAL PHALANGEAL JOINT  ARTHRODESIS;  Surgeon: Toni ArthursJohn Hewitt, MD;  Location: [facility];  Service: Orthopedics;  Laterality: Right;  . caesarean section    . CESAREAN SECTION    . HAMMERTOE RECONSTRUCTION WITH WEIL OSTEOTOMY Right 10/08/2015   Procedure: RIGHT SECOND METATARSAL WEIL HAMMERTOE CORRECTION;  Surgeon: Toni ArthursJohn Hewitt, MD;  Location: [facility];  Service: Orthopedics;  Laterality: Right;  . TONSILLECTOMY      There were no vitals filed for this visit.      Subjective Assessment - 07/04/17 0758    Subjective I haven't done as much of the exercise, not in much pain right now   Currently in Pain? Yes   Pain Score 1    Pain Location Back   Pain Orientation Right;Left;Lower   Pain Radiating Towards posterior thighs                         [facility] Adult PT Treatment/Exercise  - 07/04/17 0001      Exercises   Exercises Lumbar     Lumbar Exercises: Stretches   Passive Hamstring Stretch 20 seconds;4 reps   ITB Stretch 20 seconds;4 reps     Lumbar Exercises: Aerobic   Tread Mill NuStep level 4 x 5 minutes     Lumbar Exercises: Machines for Strengthening   Cybex Knee Extension 5# 2x10   Cybex Knee Flexion 20# 2x10   Other Lumbar Machine Exercise seated row 15#, lats 15# 2x10 each   Other Lumbar Machine Exercise straight arm pull down 20# with core engagement     Lumbar Exercises: Supine   Other Supine Lumbar Exercises feet on ball K2C, trunk rotation, small bridge, abdominal isometrics                  PT Short Term Goals - 07/04/17 1007      PT SHORT TERM GOAL #1   Title independent iwth initial HEP   Status Achieved           PT Long Term Goals - 06/27/17 1216      PT LONG TERM GOAL #1   Title understand proper posture and body mechanics   Time 8   Period Weeks   Status New     PT LONG TERM GOAL #  2   Title increase lumbar ROM 25%   Time 8   Period Weeks   Status New     PT LONG TERM GOAL #3   Title decrease pain/numbness 25%   Time 8   Period Weeks   Status New               Plan - 07/04/17 1006    Clinical Impression Statement Patient did well with the exercises, no c/o pain.  Some cues needed, reports that she does not like exercise, but is willing to learn how to do stuff that is good for her at her home gym   PT Next Visit Plan she is to take pix of her gym equipment, again could try traction if hurting   Consulted and Agree with Plan of Care Patient      Patient will benefit from skilled therapeutic intervention in order to improve the following deficits and impairments:  Decreased strength, Postural dysfunction, Improper body mechanics, Impaired flexibility, Pain, Increased muscle spasms, Decreased range of motion, Difficulty walking  Visit Diagnosis: Acute bilateral low back pain with bilateral  sciatica     Problem List Patient Active Problem List   Diagnosis Date Noted  . Prediabetes 02/02/2017  . Pure hyperglyceridemia 02/02/2017  . Hypokalemia 02/02/2017  . Degenerative cervical disc 01/26/2016  . Degenerative lumbar disc 01/26/2016  . Low back pain 01/20/2016  . Routine general medical examination at a health care facility 07/18/2014  . Hereditary and idiopathic peripheral neuropathy 01/31/2008  . SHOULDER PAIN, LEFT 01/31/2008  . Hyperlipidemia LDL goal <130 12/05/2007  . Essential hypertension 12/05/2007    Jearld Lesch., PT 07/04/2017, 10:07 AM  [facility] [address] [place], [place], [address] Phone: [phone]   Fax:  [phone]  Name: Kimberly Cox MRN: [id number] Date of Birth: 1946-09-04

## 2017-07-06 ENCOUNTER — Encounter: Payer: Self-pay | Admitting: Physical Therapy

## 2017-07-06 ENCOUNTER — Ambulatory Visit: Payer: BLUE CROSS/BLUE SHIELD | Attending: Internal Medicine | Admitting: Physical Therapy

## 2017-07-06 DIAGNOSIS — M5441 Lumbago with sciatica, right side: Secondary | ICD-10-CM | POA: Insufficient documentation

## 2017-07-06 DIAGNOSIS — M5442 Lumbago with sciatica, left side: Secondary | ICD-10-CM | POA: Insufficient documentation

## 2017-07-06 NOTE — Therapy (Signed)
[address] [place], [place], [address] Phone: [phone]   Fax:  [phone]  Physical Therapy Treatment  Patient Details  Name: Kimberly Cox MRN: [id number] Date of Birth: 03-19-1946 Referring Provider: Parks Ranger  Encounter Date: 07/06/2017      PT End of Session - 07/06/17 1526    Visit Number 3   Date for PT Re-Evaluation 08/27/17   PT Start Time 3846   PT Stop Time 1540   PT Time Calculation (min) 55 min   Activity Tolerance Patient tolerated treatment well   Behavior During Therapy [facility] for tasks assessed/performed      Past Medical History:  Diagnosis Date  . Anxiety   . Anxiety disorder   . Concussion Dec 13th, 2011   fell, striking her head against a wall, developed dizziness - UC eval c/w concussion. Had follow-up and did ok.   . History of chest pain   . Hyperlipidemia   . Hypertension   . Plantar fasciitis     Past Surgical History:  Procedure Laterality Date  . ARTHRODESIS METATARSALPHALANGEAL JOINT (MTPJ) Right 10/08/2015   Procedure: RIGHT HALLUX METATARSAL PHALANGEAL JOINT  ARTHRODESIS;  Surgeon: Wylene Simmer, MD;  Location: [place];  Service: Orthopedics;  Laterality: Right;  . caesarean section    . CESAREAN SECTION    . HAMMERTOE RECONSTRUCTION WITH WEIL OSTEOTOMY Right 10/08/2015   Procedure: RIGHT SECOND METATARSAL WEIL HAMMERTOE CORRECTION;  Surgeon: Wylene Simmer, MD;  Location: [place];  Service: Orthopedics;  Laterality: Right;  . TONSILLECTOMY      There were no vitals filed for this visit.      Subjective Assessment - 07/06/17 1524    Pertinent History Reports that she was sore in the low back, "deep" after the last exercises   Currently in Pain? Yes   Pain Score 2    Pain Location Back   Pain Orientation Lower                         [facility] Adult PT Treatment/Exercise - 07/06/17 0001      Self-Care   Self-Care Other Self-Care Comments   Other Self-Care Comments  went over TENS, sheet traction at home, she took pix of her exercise equipment and I went over what she could do with it, form, safety etc... she was able to verbalize understanding     Lumbar Exercises: Stretches   Passive Hamstring Stretch 20 seconds;4 reps   ITB Stretch 20 seconds;4 reps     Lumbar Exercises: Aerobic   Tread Mill NuStep level 4 x 5 minutes     Lumbar Exercises: Machines for Strengthening   Other Lumbar Machine Exercise seated row 15#, lats 15# 2x10 each   Other Lumbar Machine Exercise straight arm pull down 20# with core engagement, went over with her the 5# hip extension and abduction     Lumbar Exercises: Supine   Other Supine Lumbar Exercises feet on ball K2C, trunk rotation, small bridge, abdominal isometrics                  PT Short Term Goals - 07/04/17 1007      PT SHORT TERM GOAL #1   Title independent iwth initial HEP   Status Achieved           PT Long Term Goals - 07/06/17 1528      PT LONG TERM GOAL #  1   Title understand proper posture and body mechanics   Status Partially Met     PT LONG TERM GOAL #2   Title increase lumbar ROM 25%   Status Achieved     PT LONG TERM GOAL #3   Title decrease pain/numbness 25%   Status On-going               Plan - 07/06/17 1527    Clinical Impression Statement Patient had some c/o numbness and tingling with the exercises.  she feels that she can try he exercises with her equipment.   PT Next Visit Plan she is to try sheet traction , possible TENS and exercises   Consulted and Agree with Plan of Care Patient      Patient will benefit from skilled therapeutic intervention in order to improve the following deficits and impairments:  Decreased strength, Postural dysfunction, Improper body mechanics, Impaired flexibility, Pain, Increased muscle spasms, Decreased range of motion, Difficulty walking  Visit Diagnosis: Acute  bilateral low back pain with bilateral sciatica     Problem List Patient Active Problem List   Diagnosis Date Noted  . Prediabetes 02/02/2017  . Pure hyperglyceridemia 02/02/2017  . Hypokalemia 02/02/2017  . Degenerative cervical disc 01/26/2016  . Degenerative lumbar disc 01/26/2016  . Low back pain 01/20/2016  . Routine general medical examination at a health care facility 07/18/2014  . Hereditary and idiopathic peripheral neuropathy 01/31/2008  . SHOULDER PAIN, LEFT 01/31/2008  . Hyperlipidemia LDL goal <130 12/05/2007  . Essential hypertension 12/05/2007    Sumner Boast., PT 07/06/2017, 3:29 PM  [address] [place], [place], [address] Phone: [phone]   Fax:  [phone]  Name: Kimberly Cox MRN: [id number] Date of Birth: 1946-05-30

## 2017-07-11 ENCOUNTER — Ambulatory Visit: Payer: BLUE CROSS/BLUE SHIELD | Admitting: Physical Therapy

## 2017-07-30 ENCOUNTER — Other Ambulatory Visit: Payer: Self-pay | Admitting: Internal Medicine

## 2017-07-30 DIAGNOSIS — I10 Essential (primary) hypertension: Secondary | ICD-10-CM

## 2017-08-07 ENCOUNTER — Telehealth: Payer: Self-pay | Admitting: Internal Medicine

## 2017-08-07 DIAGNOSIS — I10 Essential (primary) hypertension: Secondary | ICD-10-CM

## 2017-08-07 MED ORDER — LOSARTAN POTASSIUM-HCTZ 100-12.5 MG PO TABS: 1.0000 | ORAL_TABLET | Freq: Every day | ORAL | 1 refills | Status: DC

## 2017-08-07 NOTE — Telephone Encounter (Signed)
Left message letting pt know medication had been refilled.

## 2017-08-07 NOTE — Telephone Encounter (Signed)
Copied from CRM [id number]. Topic: General - Other >> Aug 07, 2017 10:41 AM Cecelia ByarsGreen, Diedre Maclellan L, RMA wrote: Reason for CRM: medication refill request for Losartan-Hydrochlorothiazide 100-12.5 mg [facility] [place]

## 2017-08-21 ENCOUNTER — Encounter: Payer: Self-pay | Admitting: Internal Medicine

## 2017-08-21 DIAGNOSIS — J069 Acute upper respiratory infection, unspecified: Secondary | ICD-10-CM | POA: Diagnosis not present

## 2017-08-21 DIAGNOSIS — R0789 Other chest pain: Secondary | ICD-10-CM | POA: Diagnosis not present

## 2017-08-21 DIAGNOSIS — R0981 Nasal congestion: Secondary | ICD-10-CM | POA: Diagnosis not present

## 2017-08-22 ENCOUNTER — Ambulatory Visit: Payer: BLUE CROSS/BLUE SHIELD | Admitting: Internal Medicine

## 2017-08-22 ENCOUNTER — Encounter: Payer: Self-pay | Admitting: Internal Medicine

## 2017-08-22 VITALS — BP 130/90 | HR 78 | Temp 98.2°F | Resp 16 | Ht 65.0 in | Wt 159.2 lb

## 2017-08-22 DIAGNOSIS — J4530 Mild persistent asthma, uncomplicated: Secondary | ICD-10-CM | POA: Diagnosis not present

## 2017-08-22 DIAGNOSIS — K2100 Gastro-esophageal reflux disease with esophagitis, without bleeding: Secondary | ICD-10-CM

## 2017-08-22 DIAGNOSIS — J45909 Unspecified asthma, uncomplicated: Secondary | ICD-10-CM | POA: Insufficient documentation

## 2017-08-22 DIAGNOSIS — I10 Essential (primary) hypertension: Secondary | ICD-10-CM

## 2017-08-22 DIAGNOSIS — R079 Chest pain, unspecified: Secondary | ICD-10-CM

## 2017-08-22 DIAGNOSIS — J988 Other specified respiratory disorders: Secondary | ICD-10-CM | POA: Diagnosis not present

## 2017-08-22 MED ORDER — METHYLPREDNISOLONE 4 MG PO TBPK: ORAL_TABLET | ORAL | 0 refills | Status: DC

## 2017-08-22 MED ORDER — CEFDINIR 300 MG PO CAPS: 300.0000 mg | ORAL_CAPSULE | Freq: Two times a day (BID) | ORAL | 0 refills | Status: DC

## 2017-08-22 MED ORDER — DEXLANSOPRAZOLE 60 MG PO CPDR: 60.0000 mg | DELAYED_RELEASE_CAPSULE | Freq: Every day | ORAL | 1 refills | Status: DC

## 2017-08-22 MED ORDER — HYDROCODONE-HOMATROPINE 5-1.5 MG/5ML PO SYRP: 5.0000 mL | ORAL_SOLUTION | Freq: Three times a day (TID) | ORAL | 0 refills | Status: DC | PRN

## 2017-08-22 NOTE — Telephone Encounter (Signed)
Spoke to pt per Dr. Yetta BarreJones. Pt can come in at 4pm.   Will you add her on for today and cancel her appt for tomorrow.

## 2017-08-22 NOTE — Progress Notes (Signed)
Subjective:  Patient ID: Kimberly Cox, female    DOB: 1946-07-07  Age: 71 y.o. MRN: [id number]  CC: Gastroesophageal Reflux and Cough   HPI Kimberly Cox presents for multiple symptoms.  She has had a 3-week history of cough that has become productive of yellow-green phlegm.  She denies any recent episodes of shortness of breath, fever, chills, or hemoptysis.  She also had an episode of chest pain 1 day prior to this visit.  She was shopping at [employer] using her phone when she felt the acute onset of a burning sensation under her sternum that spread to both sides of her chest.  It also radiated up into her right jaw.  She did not experience diaphoresis, shortness of breath, dizziness, lightheadedness, or near syncope.  She continued shopping and the pain eventually resolved on its own after about 30-40 minutes.  When she got home from shopping she took an aspirin and that did not change her symptoms.  She also struggles with intermittent heartburn and an occasional water brash in the back of her throat.  He is trying to control her symptoms with Alka-Seltzer.  She was seen in an urgent care center 1 day prior to this and had a mildly abnormal EKG.  Of note, [facility] 7 [facility] and had an EKG that was also abnormal but she also underwent an ETT that was normal.  Outpatient Medications Prior to Visit  Medication Sig Dispense Refill  . aspirin 81 MG tablet Take 81 mg by mouth daily.      Marland Kitchen. b complex vitamins tablet Take 1 tablet by mouth daily.    . Biotin 1000 MCG tablet Take 5,000 mcg by mouth daily.     . Lactobacillus (ACIDOPHILUS) 10 MG CAPS Take by mouth.      . losartan-hydrochlorothiazide (HYZAAR) 100-12.5 MG tablet Take 1 tablet by mouth daily. 90 tablet 1  . Omega-3 Fatty Acids (FISH OIL) 1000 MG CAPS Take 2,000 mg by mouth.    . rosuvastatin (CRESTOR) 5 MG tablet   3  . venlafaxine XR (EFFEXOR-XR) 37.5 MG 24 hr capsule   3   No  facility-administered medications prior to visit.     ROS Review of Systems  Constitutional: Negative for chills, diaphoresis, fatigue and fever.  HENT: Negative.  Negative for facial swelling, sore throat, trouble swallowing and voice change.   Eyes: Negative.   Respiratory: Positive for cough and wheezing. Negative for choking, chest tightness, shortness of breath and stridor.   Cardiovascular: Positive for chest pain. Negative for palpitations and leg swelling.  Gastrointestinal: Negative for abdominal pain, blood in stool, diarrhea, nausea and vomiting.  Endocrine: Negative.   Genitourinary: Negative.  Negative for difficulty urinating.  Musculoskeletal: Negative.  Negative for back pain and neck pain.  Skin: Negative for color change and pallor.  Allergic/Immunologic: Negative.   Neurological: Negative.  Negative for dizziness, weakness and light-headedness.  Hematological: Negative for adenopathy. Does not bruise/bleed easily.  Psychiatric/Behavioral: Negative.     Objective:  BP 130/90 (BP Location: Left Arm, Patient Position: Sitting, Cuff Size: Normal)   Pulse 78   Temp 98.2 F (36.8 C) (Oral)   Resp 16   Ht 5\' 5"  (1.651 m)   Wt 159 lb 4 oz (72.2 kg)   SpO2 98%   BMI 26.50 kg/m   BP Readings from Last 3 Encounters:  08/22/17 130/90  02/02/17 122/70  12/12/16 124/72  Wt Readings from Last 3 Encounters:  08/22/17 159 lb 4 oz (72.2 kg)  02/02/17 158 lb (71.7 kg)  01/26/16 157 lb (71.2 kg)    Physical Exam  Constitutional: She is oriented to person, place, and time. No distress.  HENT:  Mouth/Throat: Oropharynx is clear and moist. No oropharyngeal exudate.  Eyes: Conjunctivae are normal. Left eye exhibits no discharge. No scleral icterus.  Neck: Normal range of motion. Neck supple. No JVD present. No thyromegaly present.  Cardiovascular: Normal rate, regular rhythm and normal heart sounds. Exam reveals no friction rub.  No murmur heard. EKG ---  Sinus   Rhythm  -Left axis -anterior fascicular block.   -  Negative precordial T-waves  -Probably normal -consider anteroseptal ischemia.   ABNORMAL - no change compared to EKG done 7 months ago ay [facility]   Pulmonary/Chest: Effort normal. No respiratory distress. She has no decreased breath sounds. She has wheezes in the right middle field and the left middle field. She has rhonchi in the right middle field and the left middle field. She has no rales. She exhibits no tenderness.  There are bilateral, posterior, mid zone inspiratory wheezes and rhonchi that clear with cough.  There is no respiratory distress and she has good air movement.  Abdominal: Soft. Bowel sounds are normal. She exhibits no mass. There is no tenderness. There is no guarding.  Musculoskeletal: Normal range of motion. She exhibits no edema, tenderness or deformity.  Lymphadenopathy:    She has no cervical adenopathy.  Neurological: She is alert and oriented to person, place, and time.  Skin: Skin is warm and dry. No rash noted. She is not diaphoretic. No erythema. No pallor.  Vitals reviewed.   Lab Results  Component Value Date   WBC 4.9 01/17/2017   HGB 14.2 01/17/2017   HCT 42 01/17/2017   PLT 217 01/17/2017   GLUCOSE 113 (H) 01/27/2016   CHOL 140 01/17/2017   TRIG 110 01/17/2017   HDL 50 01/17/2017   LDLDIRECT 108.0 06/30/2015   LDLCALC 60 01/17/2017   ALT 30 01/17/2017   AST 21 01/17/2017   NA 141 01/17/2017   K 3.3 (A) 01/17/2017   CL 104 01/27/2016   CREATININE 0.8 01/17/2017   BUN 17 01/17/2017   CO2 32 01/27/2016   TSH 2.22 01/17/2017   HGBA1C 5.7 06/30/2015    Mm Screening Breast Tomo Bilateral  Result Date: 12/14/2016 CLINICAL DATA:  Screening. EXAM: 2D DIGITAL SCREENING BILATERAL MAMMOGRAM WITH CAD AND ADJUNCT TOMO COMPARISON:  Previous exam(s). ACR Breast Density Category c: The breast tissue is heterogeneously dense, which may obscure small masses. FINDINGS: There are no findings suspicious for  malignancy. Images were processed with CAD. IMPRESSION: No mammographic evidence of malignancy. A result letter of this screening mammogram will be mailed directly to the patient. RECOMMENDATION: Screening mammogram in one year. (Code:SM-B-01Y) BI-RADS CATEGORY  1: Negative. Electronically Signed   By: Gerome Samavid  Williams III M.D   On: 12/14/2016 15:59    Assessment & Plan:   Kimberly Cox was seen today for gastroesophageal reflux and cough.  Diagnoses and all orders for this visit:  Essential hypertension- Her blood pressure is adequately well controlled. -     EKG 12-Lead  GERD with esophagitis- I am concerned this may be causing her chest pain so I have asked her to start taking a PPI and to stop taking Alka-Seltzer. -     dexlansoprazole (DEXILANT) 60 MG capsule; Take 1 capsule (60 mg total) by mouth daily.  Mild persistent asthmatic bronchitis without complication- Will treat with a course of systemic steroids to reduce the wheezing and to help control her symptoms. -     methylPREDNISolone (MEDROL DOSEPAK) 4 MG TBPK tablet; TAKE AS DIRECTED  RTI (respiratory tract infection)- I will treat the infection with Cefdinir and will control the cough with Hycodan as needed. -     cefdinir (OMNICEF) 300 MG capsule; Take 1 capsule (300 mg total) by mouth 2 (two) times daily. -     HYDROcodone-homatropine (HYCODAN) 5-1.5 MG/5ML syrup; Take 5 mLs by mouth every 8 (eight) hours as needed for cough.  Chest pain, unspecified type   I have discontinued Kimberly Sine L. Cox's venlafaxine XR. I am also having her start on dexlansoprazole, cefdinir, methylPREDNISolone, and HYDROcodone-homatropine. Additionally, I am having her maintain her ACIDOPHILUS, aspirin, Biotin, b complex vitamins, Fish Oil, rosuvastatin, and losartan-hydrochlorothiazide.  Meds ordered this encounter  Medications  . dexlansoprazole (DEXILANT) 60 MG capsule    Sig: Take 1 capsule (60 mg total) by mouth daily.    Dispense:  90 capsule     Refill:  1  . cefdinir (OMNICEF) 300 MG capsule    Sig: Take 1 capsule (300 mg total) by mouth 2 (two) times daily.    Dispense:  20 capsule    Refill:  0  . methylPREDNISolone (MEDROL DOSEPAK) 4 MG TBPK tablet    Sig: TAKE AS DIRECTED    Dispense:  21 tablet    Refill:  0  . HYDROcodone-homatropine (HYCODAN) 5-1.5 MG/5ML syrup    Sig: Take 5 mLs by mouth every 8 (eight) hours as needed for cough.    Dispense:  120 mL    Refill:  0     Follow-up: Return in about 3 weeks (around 09/12/2017).  Sanda Linger, MD

## 2017-08-23 ENCOUNTER — Encounter: Payer: Self-pay | Admitting: Internal Medicine

## 2017-08-23 ENCOUNTER — Inpatient Hospital Stay: Payer: BLUE CROSS/BLUE SHIELD | Admitting: Internal Medicine

## 2017-08-23 DIAGNOSIS — R079 Chest pain, unspecified: Secondary | ICD-10-CM | POA: Insufficient documentation

## 2017-08-23 NOTE — Patient Instructions (Signed)
Chest Wall Pain °Chest wall pain is pain in or around the bones and muscles of your chest. Sometimes, an injury causes this pain. Sometimes, the cause may not be known. This pain may take several weeks or longer to get better. °Follow these instructions at home: °Pay attention to any changes in your symptoms. Take these actions to help with your pain: °· Rest as told by your health care provider. °· Avoid activities that cause pain. These include any activities that use your chest muscles or your abdominal and side muscles to lift heavy items. °· If directed, apply ice to the painful area: °? Put ice in a plastic bag. °? Place a towel between your skin and the bag. °? Leave the ice on for 20 minutes, 2-3 times per day. °· Take over-the-counter and prescription medicines only as told by your health care provider. °· Do not use tobacco products, including cigarettes, chewing tobacco, and e-cigarettes. If you need help quitting, ask your health care provider. °· Keep all follow-up visits as told by your health care provider. This is important. ° °Contact a health care provider if: °· You have a fever. °· Your chest pain becomes worse. °· You have new symptoms. °Get help right away if: °· You have nausea or vomiting. °· You feel sweaty or light-headed. °· You have a cough with phlegm (sputum) or you cough up blood. °· You develop shortness of breath. °This information is not intended to replace advice given to you by your health care provider. Make sure you discuss any questions you have with your health care provider. °Document Released: 08/22/2005 Document Revised: 12/31/2015 Document Reviewed: 11/17/2014 °Elsevier Interactive Patient Education © 2018 Elsevier Inc. ° °

## 2017-08-24 ENCOUNTER — Ambulatory Visit: Payer: Self-pay | Admitting: *Deleted

## 2017-08-24 ENCOUNTER — Other Ambulatory Visit: Payer: Self-pay | Admitting: Internal Medicine

## 2017-08-24 DIAGNOSIS — T7840XA Allergy, unspecified, initial encounter: Secondary | ICD-10-CM

## 2017-08-24 MED ORDER — HYDROXYZINE HCL 25 MG PO TABS: 25.0000 mg | ORAL_TABLET | ORAL | 1 refills | Status: DC | PRN

## 2017-08-24 NOTE — Telephone Encounter (Signed)
Please stop the antibiotic cefdinir Stay on the steroids and Dexilant  I will send a prescription antihistamine to her pharmacy.

## 2017-08-24 NOTE — Telephone Encounter (Signed)
Notified pt w/MD response. Also ask pt how severe was the itching. Pt states its not too bad, but she is starting to itch all over especially if she thinks about it, but it is controllable. As this morning her cheeks is rosey red, but denies any swelling, or sob sxs. She states she will start on the antihistamine, and if sxs persist will let MD know...Kimberly Cox/LMB

## 2017-08-24 NOTE — Telephone Encounter (Signed)
Called in c/o itching all over that started last night.   She was started on Cefdinir and Prednisone Tuesday night for an upper respiratory infection.   She was also given a sample of Dexilant for GERD that she has taken.    I encouraged her to take a Claritin (she had this available). I sent a high priority note to Dr. Barnett ApplebaumJones's nurse pool.  Reason for Disposition . Taking prescription medication that could cause itching (e.g., codeine/morphine/other opiates, aspirin)  Answer Assessment - Initial Assessment Questions 1. DESCRIPTION: "Describe the itching you are having."     I'm itching all over.  From my scalp to my feet.  My eyes too.  No rash just itching.   The doctor started me on Cefdinir and Prednisone for upper respiratory infection.   I was given a sample of Dexilant for my GERD which I took also. 2. SEVERITY: "How bad is it?"    - MILD - doesn't interfere with normal activities   - MODERATE-SEVERE: interferes with work, school, sleep, or other activities      Very severe all over. 3. SCRATCHING: "Are there any scratch marks? Bleeding?"     No 4. ONSET: "When did this begin?"      The itching started last night 5. CAUSE: "What do you think is causing the itching?" (ask about swimming pools, pollen, animals, soaps, etc.)     One of these medications I just started. 6. OTHER SYMPTOMS: "Do you have any other symptoms?"      No other symptoms 7. PREGNANCY: "Is there any chance you are pregnant?" "When was your last menstrual period?"     Not asked due to age  Protocols usedMariella Saa: ITCHING [facility]- [id number]

## 2017-08-31 DIAGNOSIS — H5213 Myopia, bilateral: Secondary | ICD-10-CM | POA: Diagnosis not present

## 2017-08-31 DIAGNOSIS — H2513 Age-related nuclear cataract, bilateral: Secondary | ICD-10-CM | POA: Diagnosis not present

## 2017-08-31 DIAGNOSIS — H353111 Nonexudative age-related macular degeneration, right eye, early dry stage: Secondary | ICD-10-CM | POA: Diagnosis not present

## 2017-09-11 ENCOUNTER — Telehealth: Payer: Self-pay | Admitting: Internal Medicine

## 2017-09-11 DIAGNOSIS — K2100 Gastro-esophageal reflux disease with esophagitis, without bleeding: Secondary | ICD-10-CM

## 2017-09-11 MED ORDER — DEXLANSOPRAZOLE 60 MG PO CPDR: 60.0000 mg | DELAYED_RELEASE_CAPSULE | Freq: Every day | ORAL | 0 refills | Status: DC

## 2017-09-11 NOTE — Telephone Encounter (Signed)
Reviewed chart pt saw dr. Yetta BarreJones back in Dec was told to f/u w/PCP. Pt has not seen Dr. Okey Duprerawford since 2016 will need to make appt for future refills

## 2017-09-11 NOTE — Telephone Encounter (Signed)
Copied from CRM [id number]. Topic: Quick Communication - Rx Refill/Question >> Sep 11, 2017 10:45 AM Oneal GroutSebastian, Jennifer S wrote: Has the patient contacted their pharmacy? No. No refills avail   (Agent: If no, request that the patient contact the pharmacy for the refill.)   Preferred Pharmacy (with phone number or street name): [facility] [place] [place]    Agent: Please be advised that RX refills may take up to 3 business days. We ask that you follow-up with your pharmacy. Requesting  prescription of dexlansoprazole (DEXILANT) 60 MG capsule, only requesting 1 mth at the moment.

## 2017-09-13 ENCOUNTER — Telehealth: Payer: Self-pay

## 2017-09-13 NOTE — Telephone Encounter (Signed)
PA started on CoverMyMeds KEY: [id number]

## 2017-09-18 NOTE — Telephone Encounter (Signed)
Informed patient that PA was denied and she states that the omeprazole is covered by insurance. States she doesn't want to pursue the dexilant because it is about $300.

## 2017-09-18 NOTE — Telephone Encounter (Signed)
Pt calling for a f/u on the PA.  Please f/u with pt of status.

## 2017-09-19 ENCOUNTER — Other Ambulatory Visit: Payer: Self-pay | Admitting: Internal Medicine

## 2017-09-19 DIAGNOSIS — K2100 Gastro-esophageal reflux disease with esophagitis, without bleeding: Secondary | ICD-10-CM

## 2017-09-19 MED ORDER — OMEPRAZOLE 40 MG PO CPDR: 40.0000 mg | DELAYED_RELEASE_CAPSULE | Freq: Every day | ORAL | 1 refills | Status: DC

## 2017-11-27 ENCOUNTER — Ambulatory Visit (INDEPENDENT_AMBULATORY_CARE_PROVIDER_SITE_OTHER)
Admission: RE | Admit: 2017-11-27 | Discharge: 2017-11-27 | Disposition: A | Discharge status: Home / Self Care | Payer: BLUE CROSS/BLUE SHIELD | Source: Ambulatory Visit | Attending: Internal Medicine | Admitting: Internal Medicine

## 2017-11-27 ENCOUNTER — Ambulatory Visit: Payer: BLUE CROSS/BLUE SHIELD | Admitting: Internal Medicine

## 2017-11-27 ENCOUNTER — Other Ambulatory Visit: Payer: Self-pay | Admitting: Internal Medicine

## 2017-11-27 ENCOUNTER — Encounter: Payer: Self-pay | Admitting: Internal Medicine

## 2017-11-27 VITALS — BP 130/72 | HR 70 | Temp 98.5°F | Ht 65.0 in | Wt 158.5 lb

## 2017-11-27 DIAGNOSIS — M1612 Unilateral primary osteoarthritis, left hip: Secondary | ICD-10-CM | POA: Diagnosis not present

## 2017-11-27 DIAGNOSIS — M25552 Pain in left hip: Secondary | ICD-10-CM

## 2017-11-27 DIAGNOSIS — G8929 Other chronic pain: Secondary | ICD-10-CM

## 2017-11-27 DIAGNOSIS — G5702 Lesion of sciatic nerve, left lower limb: Secondary | ICD-10-CM

## 2017-11-27 NOTE — Patient Instructions (Signed)
Piriformis Syndrome Piriformis syndrome is a condition that can cause pain and numbness in your buttocks and down the back of your leg. Piriformis syndrome happens when the small muscle that connects the base of your spine to your hip (piriformis muscle) presses on the nerve that runs down the back of your leg (sciatic nerve). The piriformis muscle helps your hip rotate and helps to bring your leg back and out. It also helps shift your weight while you are walking to keep you stable. The sciatic nerve runs under or through the piriformis. Damage to the piriformis muscle can cause spasms that put pressure on the nerve below. This causes pain and discomfort while sitting and moving. The pain may feel as if it begins in the buttock and spreads (radiates) down your hip and thigh. What are the causes? This condition is caused by pressure on the sciatic nerve from the piriformis muscle. The piriformis muscle can get irritated with overuse, especially if other hip muscles are weak and the piriformis has to do extra work. Piriformis syndrome can also occur after an injury, like a fall onto your buttocks. What increases the risk? This condition is more likely to develop in:  Women.  People who sit for long periods of time.  Cyclists.  People who have weak buttocks muscles (gluteal muscles).  What are the signs or symptoms? Pain, tingling, or numbness that starts in the buttock and runs down the back of your leg (sciatica) is the most common symptom of this condition. Your symptoms may:  Get worse the longer you sit.  Get worse when you walk, run, or go up on stairs.  How is this diagnosed? This condition is diagnosed based on your symptoms, medical history, and physical exam. During this exam, your health care provider may move your leg into different positions to check for pain. He or she will also press on the muscles of your hip and buttock to see if that increases your symptoms. You may also have  an X-ray or MRI. How is this treated? Treatment for this condition may include:  Stopping all activities that cause pain or make your condition worse.  Using heat or ice to relieve pain as told by your health care provider.  Taking medicines to reduce pain and swelling.  Taking a muscle relaxer to release the piriformis muscle.  Doing range-of-motion and strengthening exercises (physical therapy) as told by your health care provider.  Massaging the affected area.  Getting an injection of an anti-inflammatory medicine or muscle relaxer to reduce inflammation and muscle tension.  In rare cases, you may need surgery to cut the muscle and release pressure on the nerve if other treatments do not work. Follow these instructions at home:  Take over-the-counter and prescription medicines only as told by your health care provider.  Do not sit for long periods. Get up and walk around every 20 minutes or as often as told by your health care provider.  If directed, apply heat to the affected area as often as told by your health care provider. Use the heat source that your health care provider recommends, such as a moist heat pack or a heating pad. ? Place a towel between your skin and the heat source. ? Leave the heat on for 20-30 minutes. ? Remove the heat if your skin turns bright red. This is especially important if you are unable to feel pain, heat, or cold. You may have a greater risk of getting burned.  If   directed, apply ice to the injured area. ? Put ice in a plastic bag. ? Place a towel between your skin and the bag. ? Leave the ice on for 20 minutes, 2-3 times a day.  Do exercises as told by your health care provider.  Return to your normal activities as told by your health care provider. Ask your health care provider what activities are safe for you.  Keep all follow-up visits as told by your health care provider. This is important. How is this prevented?  Do not sit for  longer than 20 minutes at a time. When you sit, choose padded surfaces.  Warm up and stretch before being active.  Cool down and stretch after being active.  Give your body time to rest between periods of activity.  Make sure to use equipment that fits you.  Maintain physical fitness, including: ? Strength. ? Flexibility. Contact a health care provider if:  Your pain and stiffness continue or get worse.  Your leg or hip becomes weak.  You have changes in your bowel function or bladder function. This information is not intended to replace advice given to you by your health care provider. Make sure you discuss any questions you have with your health care provider. Document Released: 08/22/2005 Document Revised: 04/26/2016 Document Reviewed: 08/04/2015 Elsevier Interactive Patient Education  2018 Elsevier Inc.  

## 2017-11-27 NOTE — Progress Notes (Signed)
Subjective:  Patient ID: Kimberly Cox, female    DOB: 06-18-1946  Age: 72 y.o. MRN: [id number]  CC: Hip Pain   HPI Kimberly Cox presents for a one year history of intermittent left hip pain.  She denies trauma or injury.  She complains of a discomfort in her left posterior hip and lateral hip just distal to the trochanteric joint.  She says the pain sometimes radiates into her lower extremities and she has has intermittent numbness and tingling in her lower extremities.  She denies lower extremity weakness.  She has a history of lumbar disease and has worked with a physical therapy on her back but she has never done any PT for her left hip.  She gets symptom relief with over-the-counter anti-inflammatories.  She complains that the discomfort is there intermittently and is not particularly related to any specific position, movement or activity.  She does exercise on an elliptical.  Outpatient Medications Prior to Visit  Medication Sig Dispense Refill  . aspirin 81 MG tablet Take 81 mg by mouth daily.      Marland Kitchen b complex vitamins tablet Take 1 tablet by mouth daily.    . Biotin 1000 MCG tablet Take 5,000 mcg by mouth daily.     . Lactobacillus (ACIDOPHILUS) 10 MG CAPS Take by mouth.      . losartan-hydrochlorothiazide (HYZAAR) 100-12.5 MG tablet Take 1 tablet by mouth daily. 90 tablet 1  . Omega-3 Fatty Acids (FISH OIL) 1000 MG CAPS Take 2,000 mg by mouth.    Marland Kitchen omeprazole (PRILOSEC) 40 MG capsule Take 1 capsule (40 mg total) by mouth daily. 90 capsule 1  . rosuvastatin (CRESTOR) 5 MG tablet   3  . HYDROcodone-homatropine (HYCODAN) 5-1.5 MG/5ML syrup Take 5 mLs by mouth every 8 (eight) hours as needed for cough. 120 mL 0  . hydrOXYzine (ATARAX/VISTARIL) 25 MG tablet Take 1 tablet (25 mg total) by mouth every 4 (four) hours as needed. 50 tablet 1  . methylPREDNISolone (MEDROL DOSEPAK) 4 MG TBPK tablet TAKE AS DIRECTED 21 tablet 0   No facility-administered medications prior to visit.      ROS Review of Systems  Constitutional: Negative.   HENT: Negative.   Eyes: Negative.   Respiratory: Negative.  Negative for cough and chest tightness.   Cardiovascular: Negative.   Gastrointestinal: Negative for abdominal pain, diarrhea, nausea and vomiting.  Endocrine: Negative.   Genitourinary: Negative.  Negative for difficulty urinating.  Musculoskeletal: Positive for arthralgias and back pain. Negative for myalgias and neck pain.  Skin: Negative.  Negative for color change and pallor.  Neurological: Positive for numbness. Negative for dizziness and weakness.  Hematological: Negative for adenopathy. Does not bruise/bleed easily.  Psychiatric/Behavioral: Negative.     Objective:  BP 130/72 (BP Location: Left Arm, Patient Position: Sitting, Cuff Size: Normal)   Pulse 70   Temp 98.5 F (36.9 C) (Oral)   Ht 5\' 5"  (1.651 m)   Wt 158 lb 8 oz (71.9 kg)   SpO2 98%   BMI 26.38 kg/m   BP Readings from Last 3 Encounters:  11/27/17 130/72  08/22/17 130/90  02/02/17 122/70    Wt Readings from Last 3 Encounters:  11/27/17 158 lb 8 oz (71.9 kg)  08/22/17 159 lb 4 oz (72.2 kg)  02/02/17 158 lb (71.7 kg)    Physical Exam  Constitutional: No distress.  HENT:  Mouth/Throat: Oropharynx is clear and moist. No oropharyngeal exudate.  Eyes: Conjunctivae are normal. Left eye exhibits no  discharge. No scleral icterus.  Neck: Normal range of motion. Neck supple. No JVD present. No thyromegaly present.  Cardiovascular: Normal rate, regular rhythm and normal heart sounds. Exam reveals no gallop.  No murmur heard. Pulmonary/Chest: Effort normal and breath sounds normal. No respiratory distress. She has no wheezes. She has no rales.  Abdominal: Soft. Bowel sounds are normal. She exhibits no distension and no mass. There is no tenderness. There is no guarding.  Musculoskeletal:       Left hip: Normal. She exhibits normal range of motion, normal strength, no tenderness, no bony  tenderness, no swelling, no crepitus, no deformity and no laceration.       Lumbar back: Normal. She exhibits normal range of motion, no tenderness, no bony tenderness, no edema and no deformity.  There is mild tenderness over the left gluteus maximus and left upper lateral hip.  Lymphadenopathy:    She has no cervical adenopathy.  Neurological: She displays normal reflexes.  Neg SLR in BLE  Skin: She is not diaphoretic.  Vitals reviewed.   Lab Results  Component Value Date   WBC 4.9 01/17/2017   HGB 14.2 01/17/2017   HCT 42 01/17/2017   PLT 217 01/17/2017   GLUCOSE 113 (H) 01/27/2016   CHOL 140 01/17/2017   TRIG 110 01/17/2017   HDL 50 01/17/2017   LDLDIRECT 108.0 06/30/2015   LDLCALC 60 01/17/2017   ALT 30 01/17/2017   AST 21 01/17/2017   NA 141 01/17/2017   K 3.3 (A) 01/17/2017   CL 104 01/27/2016   CREATININE 0.8 01/17/2017   BUN 17 01/17/2017   CO2 32 01/27/2016   TSH 2.22 01/17/2017   HGBA1C 5.7 06/30/2015    Mm Screening Breast Tomo Bilateral  Result Date: 12/14/2016 CLINICAL DATA:  Screening. EXAM: 2D DIGITAL SCREENING BILATERAL MAMMOGRAM WITH CAD AND ADJUNCT TOMO COMPARISON:  Previous exam(s). ACR Breast Density Category c: The breast tissue is heterogeneously dense, which may obscure small masses. FINDINGS: There are no findings suspicious for malignancy. Images were processed with CAD. IMPRESSION: No mammographic evidence of malignancy. A result letter of this screening mammogram will be mailed directly to the patient. RECOMMENDATION: Screening mammogram in one year. (Code:SM-B-01Y) BI-RADS CATEGORY  1: Negative. Electronically Signed   By: Gerome Samavid  Williams III M.D   On: 12/14/2016 15:59   Dg Hip Unilat With Pelvis 2-3 Views Left  Result Date: 11/27/2017 CLINICAL DATA:  Left hip pain with numbness and tingling in the left leg for 2 weeks. No known injury. EXAM: DG HIP (WITH OR WITHOUT PELVIS) 2-3V LEFT COMPARISON:  None. FINDINGS: There is no evidence of fracture or  left hip dislocation. There is at most mild joint space narrowing and acetabular spurring involving the left hip. Lower lumbar disc degeneration is partially visualized. No significant soft tissue abnormalities are seen. IMPRESSION: Mild left hip degenerative changes without acute osseous abnormality. Electronically Signed   By: Sebastian AcheAllen  Grady M.D.   On: 11/27/2017 11:13     Assessment & Plan:   [name] was seen today for hip pain.  Diagnoses and all orders for this visit:  Chronic hip pain, left- Her plain film is positive for minimal DJD with no spurring.  I do not think this is a significant cause for her discomfort and I do not think she has trochanteric bursitis.  I think her symptoms are consistent with piriformis syndrome.  I have asked her to work with a physical therapist on this. -     Cancel: DG  HIP UNILAT WITH PELVIS MIN 4 VIEWS LEFT; Future -     Ambulatory referral to Physical Therapy  Piriformis syndrome of left side- as above -     Ambulatory referral to Physical Therapy   I have discontinued Harriett Sine L. Conry's methylPREDNISolone, HYDROcodone-homatropine, and hydrOXYzine. I am also having her maintain her ACIDOPHILUS, aspirin, Biotin, b complex vitamins, Fish Oil, rosuvastatin, losartan-hydrochlorothiazide, and omeprazole.  No orders of the defined types were placed in this encounter.    Follow-up: Return if symptoms worsen or fail to improve.  Sanda Linger, MD

## 2018-01-02 ENCOUNTER — Ambulatory Visit: Payer: BLUE CROSS/BLUE SHIELD | Attending: Internal Medicine | Admitting: Physical Therapy

## 2018-01-02 ENCOUNTER — Encounter: Payer: Self-pay | Admitting: Physical Therapy

## 2018-01-02 DIAGNOSIS — M5441 Lumbago with sciatica, right side: Secondary | ICD-10-CM | POA: Diagnosis not present

## 2018-01-02 DIAGNOSIS — M5442 Lumbago with sciatica, left side: Secondary | ICD-10-CM

## 2018-01-02 NOTE — Therapy (Signed)
[facility] [address] [place], [place], [address] Phone: [phone]   Fax:  [phone]  Physical Therapy Evaluation  Patient Details  Name: Kimberly Cox MRN: [id number] Date of Birth: 08/07/1946 Referring Provider: Maisie Fus   Encounter Date: 01/02/2018  PT End of Session - 01/02/18 1054    Visit Number  1    Date for PT Re-Evaluation  03/04/18    PT Start Time  1007    PT Stop Time  1100    PT Time Calculation (min)  53 min    Activity Tolerance  Patient tolerated treatment well    Behavior During Therapy  [facility] for tasks assessed/performed       Past Medical History:  Diagnosis Date  . Anxiety   . Anxiety disorder   . Concussion Dec 13th, 2011   fell, striking her head against a wall, developed dizziness - UC eval c/w concussion. Had follow-up and did ok.   . History of chest pain   . Hyperlipidemia   . Hypertension   . Plantar fasciitis     Past Surgical History:  Procedure Laterality Date  . ARTHRODESIS METATARSALPHALANGEAL JOINT (MTPJ) Right 10/08/2015   Procedure: RIGHT HALLUX METATARSAL PHALANGEAL JOINT  ARTHRODESIS;  Surgeon: Toni Arthurs, MD;  Location: [facility];  Service: Orthopedics;  Laterality: Right;  . caesarean section    . CESAREAN SECTION    . HAMMERTOE RECONSTRUCTION WITH WEIL OSTEOTOMY Right 10/08/2015   Procedure: RIGHT SECOND METATARSAL WEIL HAMMERTOE CORRECTION;  Surgeon: Toni Arthurs, MD;  Location: [facility];  Service: Orthopedics;  Laterality: Right;  . TONSILLECTOMY      There were no vitals filed for this visit.   Subjective Assessment - 01/02/18 1010    Subjective  Patient was seen here in October, she reports that the stretches that I gave her helped, she reports a flare up in March, reports that she did not do the stretches and exercises.  She has DDD of the lumbar spine, the MD felt like she had "piriformis syndrome"    Limitations   Standing;Sitting    How long can you walk comfortably?  reports shopping does cause pain in the legs and the back    Patient Stated Goals  have less pain    Currently in Pain?  Yes    Pain Score  3     Pain Location  Axilla    Pain Orientation  Lower    Pain Descriptors / Indicators  Burning;Aching;Sore;Discomfort    Pain Type  Acute pain    Pain Radiating Towards  pain in the back of the legs down to the calves, she reports worse today in the left    Pain Onset  More than a month ago    Pain Frequency  Intermittent    Aggravating Factors   standing, shopping pain up to 5/10    Pain Relieving Factors  Advil, sitting and lying down pain can go away    Effect of Pain on Daily Activities  really has limited standing and shopping         [facility] PT Assessment - 01/02/18 0001      Assessment   Medical Diagnosis  LBP, piriformis syndrome    Referring Provider  Maisie Fus    Onset Date/Surgical Date  12/02/17    Prior Therapy  3 visits in October, patient reports non compliant with the exercises      Precautions  Precautions  None      Balance Screen   Has the patient fallen in the past 6 months  No    Has the patient had a decrease in activity level because of a fear of falling?   No    Is the patient reluctant to leave their home because of a fear of falling?   No      Home Environment   Additional Comments  light housework, lift a grand child      Prior Function   Level of Independence  Independent    Vocation  Retired    Leisure  no exercise      Posture/Postural Control   Posture Comments  fwd head, rounded shoulders, left hip is higher, left bulge in the left parapsinals. scoliosis, left rib hump with fwd flexion, left iliac crest is higher than the right      ROM / Strength   AROM / PROM / Strength  AROM;Strength      AROM   Overall AROM Comments  lumbar ROM decreased 50% for all motions with c/o stiffness      Strength   Overall Strength Comments  hips 4-/5 with some  pain in the hips and the low back      Flexibility   Hamstrings  very tight 45 degrees SLR    ITB  very tight    Piriformis  very tight      Palpation   Palpation comment  she is very tight in the lumbar spine mms, the buttocks and the ITB's                Objective measurements completed on examination: See above findings.      [facility] Adult PT Treatment/Exercise - 01/02/18 0001      Modalities   Modalities  Traction      Traction   Type of Traction  Lumbar    Min (lbs)  65    Hold Time  static    Time  15             PT Education - 01/02/18 1048    Education provided  Yes    Education Details  seated HS and pirifromis stretches    Person(s) Educated  Patient    Methods  Explanation;Demonstration;Tactile cues;Verbal cues;Handout    Comprehension  Verbalized understanding;Returned demonstration;Tactile cues required;Verbal cues required       PT Short Term Goals - 01/02/18 1057      PT SHORT TERM GOAL #1   Title  independent iwth initial HEP    Time  2    Period  Weeks    Status  New        PT Long Term Goals - 01/02/18 1057      PT LONG TERM GOAL #1   Title  understand proper posture and body mechanics    Time  8    Period  Weeks    Status  New      PT LONG TERM GOAL #2   Title  increase lumbar ROM 25%    Time  8    Period  Weeks    Status  New      PT LONG TERM GOAL #3   Title  decrease pain/numbness 25%    Time  8    Period  Weeks    Status  New             Plan - 01/02/18 1055  Clinical Impression Statement  Patient has some DDD, MD reports piriformis syndrome.  She was seen here in October.  She reports that she was non compliant with the exercises.  Reports about a month ago she started having low back and hip pain.  She is extremely tight in the LE's    Clinical Presentation  Stable    Clinical Decision Making  Low    Rehab Potential  Good    PT Frequency  2x / week    PT Duration  8 weeks    PT  Treatment/Interventions  ADLs/Self Care Home Management;Electrical Stimulation;Moist Heat;Traction;Therapeutic exercise;Therapeutic activities;Patient/family education;Manual techniques    PT Next Visit Plan  See if traction helps.  She is to restart her HEP    Consulted and Agree with Plan of Care  Patient       Patient will benefit from skilled therapeutic intervention in order to improve the following deficits and impairments:  Decreased strength, Postural dysfunction, Improper body mechanics, Impaired flexibility, Pain, Increased muscle spasms, Decreased range of motion, Difficulty walking  Visit Diagnosis: Acute bilateral low back pain with bilateral sciatica - Plan: PT plan of care cert/re-cert     Problem List Patient Active Problem List   Diagnosis Date Noted  . Chronic hip pain, left 11/27/2017  . Piriformis syndrome of left side 11/27/2017  . Allergic drug reaction 08/24/2017  . Chest pain 08/23/2017  . GERD with esophagitis 08/22/2017  . Asthmatic bronchitis 08/22/2017  . RTI (respiratory tract infection) 08/22/2017  . Prediabetes 02/02/2017  . Pure hyperglyceridemia 02/02/2017  . Hypokalemia 02/02/2017  . Degenerative cervical disc 01/26/2016  . Degenerative lumbar disc 01/26/2016  . Low back pain 01/20/2016  . Routine general medical examination at a health care facility 07/18/2014  . Hereditary and idiopathic peripheral neuropathy 01/31/2008  . SHOULDER PAIN, LEFT 01/31/2008  . Hyperlipidemia LDL goal <130 12/05/2007  . Essential hypertension 12/05/2007    Jearld Lesch., PT 01/02/2018, 11:09 AM  [facility] [address] [place], [place], [address] Phone: [phone]   Fax:  [phone]  Name: Kimberly Cox MRN: [id number] Date of Birth: [birth year]

## 2018-01-10 ENCOUNTER — Encounter: Payer: Self-pay | Admitting: Physical Therapy

## 2018-01-10 ENCOUNTER — Ambulatory Visit: Payer: BLUE CROSS/BLUE SHIELD | Attending: Internal Medicine | Admitting: Physical Therapy

## 2018-01-10 DIAGNOSIS — M5442 Lumbago with sciatica, left side: Secondary | ICD-10-CM

## 2018-01-10 DIAGNOSIS — M5441 Lumbago with sciatica, right side: Secondary | ICD-10-CM | POA: Insufficient documentation

## 2018-01-10 NOTE — Therapy (Addendum)
[facility] [place], [place], [address] Phone: [phone]   Fax:  [phone]  Physical Therapy Treatment  Patient Details  Name: Kimberly Cox MRN: [id number] Date of Birth: 06/10/1946 Referring Provider: Marcello Moores   Encounter Date: 01/10/2018  PT End of Session - 01/10/18 1638    Visit Number  2    Date for PT Re-Evaluation  03/04/18    PT Start Time  0350    PT Stop Time  1617    PT Time Calculation (min)  47 min    Activity Tolerance  Patient tolerated treatment well    Behavior During Therapy  [facility] for tasks assessed/performed       Past Medical History:  Diagnosis Date  . Anxiety   . Anxiety disorder   . Concussion Dec 13th, 2011   fell, striking her head against a wall, developed dizziness - UC eval c/w concussion. Had follow-up and did ok.   . History of chest pain   . Hyperlipidemia   . Hypertension   . Plantar fasciitis     Past Surgical History:  Procedure Laterality Date  . ARTHRODESIS METATARSALPHALANGEAL JOINT (MTPJ) Right 10/08/2015   Procedure: RIGHT HALLUX METATARSAL PHALANGEAL JOINT  ARTHRODESIS;  Surgeon: Wylene Simmer, MD;  Location: [place];  Service: Orthopedics;  Laterality: Right;  . caesarean section    . CESAREAN SECTION    . HAMMERTOE RECONSTRUCTION WITH WEIL OSTEOTOMY Right 10/08/2015   Procedure: RIGHT SECOND METATARSAL WEIL HAMMERTOE CORRECTION;  Surgeon: Wylene Simmer, MD;  Location: [place];  Service: Orthopedics;  Laterality: Right;  . TONSILLECTOMY      There were no vitals filed for this visit.  Subjective Assessment - 01/10/18 1629    Subjective  Patient reports that again the stretches are helping.  She got a TENS unit, she needs instruction on how to use.    Currently in Pain?  Yes    Pain Score  2     Pain Location  Hip    Pain Orientation  Left    Pain Descriptors / Indicators  Aching    Aggravating Factors   activity                        [facility] Adult PT Treatment/Exercise - 01/10/18 0001      Self-Care   Self-Care  Other Self-Care Comments;ADL's    ADL's  went over proper lifting at home to include golfer's lift, pivots, weight close to body, vacuum, foot prop, etc, PT demo    Other Self-Care Comments   TENS unit application, instruction and education on use, freq/duration, electrodes, parameters, batteries, contraindications and safety, patient was able to verbalixe understanding      Exercises   Exercises  Lumbar      Lumbar Exercises: Stretches   Passive Hamstring Stretch  4 reps;20 seconds    Single Knee to Chest Stretch  4 reps;20 seconds    Double Knee to Chest Stretch  4 reps;20 seconds    Lower Trunk Rotation  4 reps;20 seconds    Hip Flexor Stretch  4 reps;20 seconds    ITB Stretch  4 reps;20 seconds    Piriformis Stretch  4 reps;20 seconds             PT Education - 01/10/18 1637    Education provided  Yes    Education Details  went over TENS, ADL's and  correct body mechanics    Person(s) Educated  Patient    Methods  Explanation;Demonstration;Verbal cues    Comprehension  Verbalized understanding       PT Short Term Goals - 01/10/18 1639      PT SHORT TERM GOAL #1   Title  independent iwth initial HEP    Status  Achieved        PT Long Term Goals - 01/02/18 1057      PT LONG TERM GOAL #1   Title  understand proper posture and body mechanics    Time  8    Period  Weeks    Status  New      PT LONG TERM GOAL #2   Title  increase lumbar ROM 25%    Time  8    Period  Weeks    Status  New      PT LONG TERM GOAL #3   Title  decrease pain/numbness 25%    Time  8    Period  Weeks    Status  New            Plan - 01/10/18 1638    Clinical Impression Statement  Patient felt comfortable and safe with the TENS unit, she had questions about the HEP, and needed some cues to remember, she also asked questions about proper mechnics    PT Next  Visit Plan  patient to try TENS and exercises, she reports an upcoming vacation    Consulted and Agree with Plan of Care  Patient       Patient will benefit from skilled therapeutic intervention in order to improve the following deficits and impairments:  Decreased strength, Postural dysfunction, Improper body mechanics, Impaired flexibility, Pain, Increased muscle spasms, Decreased range of motion, Difficulty walking  Visit Diagnosis: Acute bilateral low back pain with bilateral sciatica     Problem List Patient Active Problem List   Diagnosis Date Noted  . Chronic hip pain, left 11/27/2017  . Piriformis syndrome of left side 11/27/2017  . Allergic drug reaction 08/24/2017  . Chest pain 08/23/2017  . GERD with esophagitis 08/22/2017  . Asthmatic bronchitis 08/22/2017  . RTI (respiratory tract infection) 08/22/2017  . Prediabetes 02/02/2017  . Pure hyperglyceridemia 02/02/2017  . Hypokalemia 02/02/2017  . Degenerative cervical disc 01/26/2016  . Degenerative lumbar disc 01/26/2016  . Low back pain 01/20/2016  . Routine general medical examination at a health care facility 07/18/2014  . Hereditary and idiopathic peripheral neuropathy 01/31/2008  . SHOULDER PAIN, LEFT 01/31/2008  . Hyperlipidemia LDL goal <130 12/05/2007  . Essential hypertension 12/05/2007  Patient called and reported goals met and she was going to continue on own   Sumner Boast., PT 01/10/2018, 4:40 PM  [place], [place], [address] Phone: [phone]   Fax:  [phone]  Name: Kimberly Cox MRN: [id number] Date of Birth: 1946/07/28

## 2018-01-16 ENCOUNTER — Other Ambulatory Visit: Payer: Self-pay | Admitting: Obstetrics & Gynecology

## 2018-01-16 DIAGNOSIS — Z1231 Encounter for screening mammogram for malignant neoplasm of breast: Secondary | ICD-10-CM

## 2018-01-30 ENCOUNTER — Encounter: Payer: Self-pay | Admitting: Internal Medicine

## 2018-01-30 ENCOUNTER — Other Ambulatory Visit: Payer: Self-pay | Admitting: Internal Medicine

## 2018-01-30 DIAGNOSIS — I10 Essential (primary) hypertension: Secondary | ICD-10-CM

## 2018-02-21 ENCOUNTER — Ambulatory Visit
Admission: RE | Admit: 2018-02-21 | Discharge: 2018-02-21 | Disposition: A | Discharge status: Home / Self Care | Payer: BLUE CROSS/BLUE SHIELD | Source: Ambulatory Visit | Attending: Obstetrics & Gynecology | Admitting: Obstetrics & Gynecology

## 2018-02-21 DIAGNOSIS — Z1231 Encounter for screening mammogram for malignant neoplasm of breast: Secondary | ICD-10-CM

## 2018-02-21 LAB — HM MAMMOGRAPHY

## 2018-04-26 ENCOUNTER — Other Ambulatory Visit: Payer: Self-pay | Admitting: Internal Medicine

## 2018-04-26 DIAGNOSIS — I10 Essential (primary) hypertension: Secondary | ICD-10-CM

## 2018-05-01 ENCOUNTER — Encounter: Payer: Self-pay | Admitting: Internal Medicine

## 2018-05-01 ENCOUNTER — Other Ambulatory Visit (INDEPENDENT_AMBULATORY_CARE_PROVIDER_SITE_OTHER): Payer: BLUE CROSS/BLUE SHIELD

## 2018-05-01 ENCOUNTER — Ambulatory Visit: Payer: BLUE CROSS/BLUE SHIELD | Admitting: Internal Medicine

## 2018-05-01 VITALS — BP 124/82 | HR 77 | Temp 98.3°F | Resp 16 | Ht 65.0 in | Wt 155.8 lb

## 2018-05-01 DIAGNOSIS — I10 Essential (primary) hypertension: Secondary | ICD-10-CM | POA: Diagnosis not present

## 2018-05-01 DIAGNOSIS — M5416 Radiculopathy, lumbar region: Secondary | ICD-10-CM

## 2018-05-01 DIAGNOSIS — E876 Hypokalemia: Secondary | ICD-10-CM | POA: Diagnosis not present

## 2018-05-01 DIAGNOSIS — R7303 Prediabetes: Secondary | ICD-10-CM

## 2018-05-01 DIAGNOSIS — E785 Hyperlipidemia, unspecified: Secondary | ICD-10-CM | POA: Diagnosis not present

## 2018-05-01 DIAGNOSIS — M5431 Sciatica, right side: Secondary | ICD-10-CM

## 2018-05-01 DIAGNOSIS — Z23 Encounter for immunization: Secondary | ICD-10-CM

## 2018-05-01 LAB — COMPREHENSIVE METABOLIC PANEL
ALBUMIN: 4.9 g/dL (ref 3.5–5.2)
ALT: 32 U/L (ref 0–35)
AST: 17 U/L (ref 0–37)
Alkaline Phosphatase: 42 U/L (ref 39–117)
BUN: 28 mg/dL — AB (ref 6–23)
CO2: 31 mEq/L (ref 19–32)
Calcium: 10 mg/dL (ref 8.4–10.5)
Chloride: 100 mEq/L (ref 96–112)
Creatinine, Ser: 0.95 mg/dL (ref 0.40–1.20)
GFR: 61.74 mL/min (ref >60.00)
Glucose, Bld: 95 mg/dL (ref 70–99)
Potassium: 3.6 mEq/L (ref 3.5–5.1)
SODIUM: 139 meq/L (ref 135–145)
Total Bilirubin: 0.7 mg/dL (ref 0.2–1.2)
Total Protein: 7.3 g/dL (ref 6.0–8.3)

## 2018-05-01 LAB — LIPID PANEL
CHOL/HDL RATIO: 4
CHOLESTEROL: 174 mg/dL (ref 0–200)
HDL: 46 mg/dL (ref >39.00)
NonHDL: 128.01
TRIGLYCERIDES: 224 mg/dL — AB (ref 0.0–149.0)
VLDL: 44.8 mg/dL — ABNORMAL HIGH (ref 0.0–40.0)

## 2018-05-01 LAB — VITAMIN D 25 HYDROXY (VIT D DEFICIENCY, FRACTURES): VITD: 43.04 ng/mL (ref 30.00–100.00)

## 2018-05-01 LAB — CBC WITH DIFFERENTIAL/PLATELET
BASOS PCT: 1.3 % (ref 0.0–3.0)
Basophils Absolute: 0.1 10*3/uL (ref 0.0–0.1)
EOS PCT: 3.8 % (ref 0.0–5.0)
Eosinophils Absolute: 0.2 10*3/uL (ref 0.0–0.7)
HCT: 43.5 % (ref 36.0–46.0)
Hemoglobin: 14.9 g/dL (ref 12.0–15.0)
LYMPHS ABS: 1.3 10*3/uL (ref 0.7–4.0)
Lymphocytes Relative: 24.2 % (ref 12.0–46.0)
MCHC: 34.2 g/dL (ref 30.0–36.0)
MCV: 91.2 fl (ref 78.0–100.0)
MONO ABS: 0.4 10*3/uL (ref 0.1–1.0)
MONOS PCT: 7.5 % (ref 3.0–12.0)
Neutro Abs: 3.4 10*3/uL (ref 1.4–7.7)
Neutrophils Relative %: 63.2 % (ref 43.0–77.0)
Platelets: 234 10*3/uL (ref 150.0–400.0)
RBC: 4.77 Mil/uL (ref 3.87–5.11)
RDW: 12.7 % (ref 11.5–15.5)
WBC: 5.4 10*3/uL (ref 4.0–10.5)

## 2018-05-01 LAB — LDL CHOLESTEROL, DIRECT: LDL DIRECT: 100 mg/dL

## 2018-05-01 LAB — HEMOGLOBIN A1C: HEMOGLOBIN A1C: 5.8 % (ref 4.6–6.5)

## 2018-05-01 LAB — TSH: TSH: 1.54 u[IU]/mL (ref 0.35–4.50)

## 2018-05-01 NOTE — Progress Notes (Signed)
Subjective:  Patient ID: Kimberly Cox, female    DOB: [birth year]  Age: 72 y.o. MRN: [id number]  CC: Hypertension; Hyperlipidemia; and Back Pain   HPI Kimberly Cox presents for f/up - She complains of chronic and somewhat worsening low back pain.  The back pain radiates into both lower extremities.  She complains of numbness and tingling in her right lower extremity.  The back pain is exacerbated by activity and resolves if she lays down or bends forward.  She is taking Advil to control the pain.  Outpatient Medications Prior to Visit  Medication Sig Dispense Refill  . b complex vitamins tablet Take 1 tablet by mouth daily.    . Biotin 1000 MCG tablet Take 5,000 mcg by mouth daily.     . Lactobacillus (ACIDOPHILUS) 10 MG CAPS Take by mouth.      . losartan-hydrochlorothiazide (HYZAAR) 100-12.5 MG tablet Take 1 tablet by mouth daily. 90 tablet 1  . Omega-3 Fatty Acids (FISH OIL) 1000 MG CAPS Take 2,400 mg by mouth.     Marland Kitchen omeprazole (PRILOSEC) 40 MG capsule Take 1 capsule (40 mg total) by mouth daily. 90 capsule 1  . rosuvastatin (CRESTOR) 5 MG tablet   3  . aspirin 81 MG tablet Take 81 mg by mouth daily.      Marland Kitchen losartan-hydrochlorothiazide (HYZAAR) 100-12.5 MG tablet TAKE 1 TABLET BY MOUTH DAILY 90 tablet 0   No facility-administered medications prior to visit.     ROS Review of Systems  Constitutional: Negative for chills, diaphoresis, fatigue and fever.  HENT: Negative.   Eyes: Negative for visual disturbance.  Respiratory: Negative for cough, chest tightness and shortness of breath.   Cardiovascular: Negative for chest pain, palpitations and leg swelling.  Gastrointestinal: Negative for abdominal pain, constipation, diarrhea, nausea and vomiting.  Endocrine: Negative.   Genitourinary: Negative.  Negative for difficulty urinating.  Musculoskeletal: Positive for back pain. Negative for arthralgias, myalgias and neck pain.  Skin: Negative.   Neurological: Positive for  numbness. Negative for dizziness, weakness, light-headedness and headaches.  Hematological: Negative for adenopathy. Does not bruise/bleed easily.  Psychiatric/Behavioral: Negative.     Objective:  BP 124/82 (BP Location: Left Arm, Patient Position: Sitting, Cuff Size: Normal)   Pulse 77   Temp 98.3 F (36.8 C) (Oral)   Resp 16   Ht 5\' 5"  (1.651 m)   Wt 155 lb 12 oz (70.6 kg)   SpO2 98%   BMI 25.92 kg/m   BP Readings from Last 3 Encounters:  05/01/18 124/82  11/27/17 130/72  08/22/17 130/90    Wt Readings from Last 3 Encounters:  05/01/18 155 lb 12 oz (70.6 kg)  11/27/17 158 lb 8 oz (71.9 kg)  08/22/17 159 lb 4 oz (72.2 kg)    Physical Exam  Constitutional: She is oriented to person, place, and time. No distress.  HENT:  Mouth/Throat: Oropharynx is clear and moist. No oropharyngeal exudate.  Eyes: Conjunctivae are normal. No scleral icterus.  Neck: Normal range of motion. Neck supple. No JVD present. No thyromegaly present.  Cardiovascular: Normal rate, regular rhythm and normal heart sounds. Exam reveals no gallop and no friction rub.  No murmur heard. Pulmonary/Chest: Effort normal and breath sounds normal. No respiratory distress. She has no wheezes. She has no rales.  Abdominal: Soft. Normal appearance and bowel sounds are normal. She exhibits no mass. There is no hepatosplenomegaly. There is no tenderness.  Musculoskeletal: Normal range of motion. She exhibits no edema, tenderness or deformity.  Lymphadenopathy:    She has no cervical adenopathy.  Neurological: She is alert and oriented to person, place, and time. She has normal strength. She displays no atrophy and no tremor. No cranial nerve deficit or sensory deficit. She exhibits normal muscle tone. She displays a negative Romberg sign. She displays no seizure activity. Coordination and gait normal.  Reflex Scores:      Tricep reflexes are 1+ on the right side and 1+ on the left side.      Bicep reflexes are 1+  on the right side and 1+ on the left side.      Brachioradialis reflexes are 1+ on the right side and 1+ on the left side.      Patellar reflexes are 1+ on the right side and 1+ on the left side.      Achilles reflexes are 1+ on the right side and 1+ on the left side. Neg SLR in BLE  Skin: Skin is warm and dry. She is not diaphoretic. No pallor.  Vitals reviewed.   Lab Results  Component Value Date   WBC 5.4 05/01/2018   HGB 14.9 05/01/2018   HCT 43.5 05/01/2018   PLT 234.0 05/01/2018   GLUCOSE 95 05/01/2018   CHOL 174 05/01/2018   TRIG 224.0 (H) 05/01/2018   HDL 46.00 05/01/2018   LDLDIRECT 100.0 05/01/2018   LDLCALC 60 01/17/2017   ALT 32 05/01/2018   AST 17 05/01/2018   NA 139 05/01/2018   K 3.6 05/01/2018   CL 100 05/01/2018   CREATININE 0.95 05/01/2018   BUN 28 (H) 05/01/2018   CO2 31 05/01/2018   TSH 1.54 05/01/2018   HGBA1C 5.8 05/01/2018    Mm 3d Screen Breast Bilateral  Result Date: 02/21/2018 CLINICAL DATA:  Screening. EXAM: DIGITAL SCREENING BILATERAL MAMMOGRAM WITH TOMO AND CAD COMPARISON:  Previous exam(s). ACR Breast Density Category c: The breast tissue is heterogeneously dense, which may obscure small masses. FINDINGS: There are no findings suspicious for malignancy. Images were processed with CAD. IMPRESSION: No mammographic evidence of malignancy. A result letter of this screening mammogram will be mailed directly to the patient. RECOMMENDATION: Screening mammogram in one year. (Code:SM-B-01Y) BI-RADS CATEGORY  1: Negative. Electronically Signed   By: Kimberly PavlovElizabeth  Cox M.D.   On: 02/21/2018 12:41    Assessment & Plan:   Kimberly Cox was seen today for hypertension, hyperlipidemia and back pain.  Diagnoses and all orders for this visit:  Essential hypertension- Her blood pressure is well controlled.  Electrolytes and renal function are normal. -     CBC with Differential/Platelet; Future -     VITAMIN D 25 Hydroxy (Vit-D Deficiency, Fractures); Future -      Comprehensive metabolic panel; Future  Need for influenza vaccination -     Flu Vaccine QUAD 36+ mos IM  Hypokalemia- Her potassium level is normal now at 3.6. -     Comprehensive metabolic panel; Future  Prediabetes- Improvement noted -     Hemoglobin A1c; Future -     Comprehensive metabolic panel; Future  Hyperlipidemia LDL goal <130- She has achieved her LDL goal and is doing well on the statin. -     Lipid panel; Future -     TSH; Future  Sciatica of right side- see below -     MR Lumbar Spine Wo Contrast; Future -     MR SACRUM SI JOINTS WO CONTRAST; Future  Lumbar radiculitis -she has back pain that radiates into her lower extremities but she  is neurologically intact.  Her symptoms are suspicious for spinal stenosis and sciatica.  I have asked her to undergo an MRI of the lumbar spine and sacrum to see if there is a condition such as spinal stenosis that would benefit from surgical intervention. -     MR Lumbar Spine Wo Contrast; Future -     MR SACRUM SI JOINTS WO CONTRAST; Future  Need for pneumococcal vaccination -     Pneumococcal polysaccharide vaccine 23-valent greater than or equal to 2yo subcutaneous/IM   I have discontinued Kimberly Sine L. Deroy's aspirin. I am also having her maintain her ACIDOPHILUS, Biotin, b complex vitamins, Fish Oil, rosuvastatin, losartan-hydrochlorothiazide, and omeprazole.  No orders of the defined types were placed in this encounter.    Follow-up: Return in about 6 months (around 11/01/2018).  Sanda Linger, MD

## 2018-05-01 NOTE — Patient Instructions (Signed)

## 2018-05-02 ENCOUNTER — Encounter: Payer: Self-pay | Admitting: Internal Medicine

## 2018-05-03 ENCOUNTER — Ambulatory Visit (INDEPENDENT_AMBULATORY_CARE_PROVIDER_SITE_OTHER): Payer: BLUE CROSS/BLUE SHIELD | Admitting: Obstetrics & Gynecology

## 2018-05-03 ENCOUNTER — Encounter: Payer: Self-pay | Admitting: Obstetrics & Gynecology

## 2018-05-03 VITALS — BP 126/78 | Ht 65.0 in | Wt 155.0 lb

## 2018-05-03 DIAGNOSIS — Z01419 Encounter for gynecological examination (general) (routine) without abnormal findings: Secondary | ICD-10-CM | POA: Diagnosis not present

## 2018-05-03 DIAGNOSIS — Z780 Asymptomatic menopausal state: Secondary | ICD-10-CM | POA: Diagnosis not present

## 2018-05-03 LAB — HM PAP SMEAR

## 2018-05-03 NOTE — Patient Instructions (Signed)
1. Encounter for routine gynecological examination with Papanicolaou smear of cervix Normal gynecologic exam.  Pap reflex done today.  Breast exam normal.  Screening mammogram was negative in June 2019.  Good body mass index at 25.79.  Cologuard done 2.5 years ago.  Health labs with family physician.  2. Menopause present Well on no HRT.  No PMB.  Bone Density normal 01/2017.  Vit D supplement to continue per recent Vit D level, Ca++ intake 1.5 g/day including nutritional and supplemental, regular weight bearing physical activity.  Kimberly Cox, it was a pleasure seeing you today!  I will inform you of your results as soon as they are available.

## 2018-05-03 NOTE — Addendum Note (Signed)
Addended by: Berna SpareASTILLO, Welcome Fults A on: 05/03/2018 03:52 PM   Modules accepted: Orders

## 2018-05-03 NOTE — Progress Notes (Signed)
Kimberly Cox 17-Apr-1946 [id number]   History:    72 y.o. G2P2L2 Married  RP:  Established patient presenting for annual gyn exam   HPI: Menopause, well on no HRT.  No PMB.  No pelvic pain.  Pelvic US 12/2016 completely negative.  Urine/BMs wnl.  Breasts wnl.  BMI 25.79.  Physically active, but limited by lower back pain with numbing in legs which is under investigation, Lumbar MRI scheduled.  Health Labs with Fam MD.  Past medical history,surgical history, family history and social history were all reviewed and documented in the EPIC chart.  Gynecologic History No LMP recorded. Patient is postmenopausal. Contraception: post menopausal status Last Pap: Unknown.  [facility] Last mammogram: 02/2018. Results were: Negative Bone Density: 01/2017 Normal (Femur and Radius) Colonoscopy: Cologard 2.5 yrs ago  Obstetric History OB History  Gravida Para Term Preterm AB Living  2 2       2   SAB TAB Ectopic Multiple Live Births               # Outcome Date GA Lbr Len/2nd Weight Sex Delivery Anes PTL Lv  2 Para           1 Para              ROS: A ROS was performed and pertinent positives and negatives are included in the history.  GENERAL: No fevers or chills. HEENT: No change in vision, no earache, sore throat or sinus congestion. NECK: No pain or stiffness. CARDIOVASCULAR: No chest pain or pressure. No palpitations. PULMONARY: No shortness of breath, cough or wheeze. GASTROINTESTINAL: No abdominal pain, nausea, vomiting or diarrhea, melena or bright red blood per rectum. GENITOURINARY: No urinary frequency, urgency, hesitancy or dysuria. MUSCULOSKELETAL: No joint or muscle pain, no back pain, no recent trauma. DERMATOLOGIC: No rash, no itching, no lesions. ENDOCRINE: No polyuria, polydipsia, no heat or cold intolerance. No recent change in weight. HEMATOLOGICAL: No anemia or easy bruising or bleeding. NEUROLOGIC: No headache, seizures, numbness, tingling or  weakness. PSYCHIATRIC: No depression, no loss of interest in normal activity or change in sleep pattern.     Exam:   BP 126/78   Ht 5\' 5"  (1.651 m)   Wt 155 lb (70.3 kg)   BMI 25.79 kg/m   Body mass index is 25.79 kg/m.  General appearance : Well developed well nourished female. No acute distress HEENT: Eyes: no retinal hemorrhage or exudates,  Neck supple, trachea midline, no carotid bruits, no thyroidmegaly Lungs: Clear to auscultation, no rhonchi or wheezes, or rib retractions  Heart: Regular rate and rhythm, no murmurs or gallops Breast:Examined in sitting and supine position were symmetrical in appearance, no palpable masses or tenderness,  no skin retraction, no nipple inversion, no nipple discharge, no skin discoloration, no axillary or supraclavicular lymphadenopathy Abdomen: no palpable masses or tenderness, no rebound or guarding Extremities: no edema or skin discoloration or tenderness  Pelvic: Vulva: Normal             Vagina: No gross lesions or discharge  Cervix: No gross lesions or discharge.  Pap reflex done  Uterus  AV, normal size, shape and consistency, non-tender and mobile  Adnexa  Without masses or tenderness  Anus: Normal   Assessment/Plan:  72 y.o. female for annual exam   1. Encounter for routine gynecological examination with Papanicolaou smear of cervix Normal gynecologic exam.  Pap reflex done today.  Breast exam normal.  Screening mammogram was negative  in June 2019.  Good body mass index at 25.79.  Cologuard done 2.5 years ago.  Health labs with family physician.  2. Menopause present Well on no HRT.  No PMB.  Bone Density normal 01/2017.  Vit D supplement to continue per recent Vit D level, Ca++ intake 1.5 g/day including nutritional and supplemental, regular weight bearing physical activity.  [name] MD, 11:15 AM 05/03/2018

## 2018-05-04 LAB — PAP IG W/ RFLX HPV ASCU

## 2018-05-10 ENCOUNTER — Ambulatory Visit
Admission: RE | Admit: 2018-05-10 | Discharge: 2018-05-10 | Disposition: A | Discharge status: Home / Self Care | Payer: BLUE CROSS/BLUE SHIELD | Source: Ambulatory Visit | Attending: Internal Medicine | Admitting: Internal Medicine

## 2018-05-10 ENCOUNTER — Other Ambulatory Visit: Payer: Self-pay | Admitting: Internal Medicine

## 2018-05-10 DIAGNOSIS — M5431 Sciatica, right side: Secondary | ICD-10-CM

## 2018-05-10 DIAGNOSIS — M545 Low back pain: Secondary | ICD-10-CM | POA: Diagnosis not present

## 2018-05-10 DIAGNOSIS — M5416 Radiculopathy, lumbar region: Secondary | ICD-10-CM

## 2018-05-10 DIAGNOSIS — M48061 Spinal stenosis, lumbar region without neurogenic claudication: Secondary | ICD-10-CM | POA: Diagnosis not present

## 2018-05-10 DIAGNOSIS — I10 Essential (primary) hypertension: Secondary | ICD-10-CM

## 2018-05-11 ENCOUNTER — Encounter: Payer: Self-pay | Admitting: Internal Medicine

## 2018-05-12 ENCOUNTER — Other Ambulatory Visit: Payer: Self-pay | Admitting: Internal Medicine

## 2018-05-12 DIAGNOSIS — M5416 Radiculopathy, lumbar region: Principal | ICD-10-CM

## 2018-05-12 DIAGNOSIS — M48061 Spinal stenosis, lumbar region without neurogenic claudication: Secondary | ICD-10-CM

## 2018-05-24 DIAGNOSIS — M48062 Spinal stenosis, lumbar region with neurogenic claudication: Secondary | ICD-10-CM | POA: Diagnosis not present

## 2018-05-24 DIAGNOSIS — I10 Essential (primary) hypertension: Secondary | ICD-10-CM | POA: Diagnosis not present

## 2018-05-24 DIAGNOSIS — Z6825 Body mass index (BMI) 25.0-25.9, adult: Secondary | ICD-10-CM | POA: Diagnosis not present

## 2018-06-08 DIAGNOSIS — M5116 Intervertebral disc disorders with radiculopathy, lumbar region: Secondary | ICD-10-CM | POA: Diagnosis not present

## 2018-06-08 DIAGNOSIS — M5416 Radiculopathy, lumbar region: Secondary | ICD-10-CM | POA: Diagnosis not present

## 2018-06-12 DIAGNOSIS — M47817 Spondylosis without myelopathy or radiculopathy, lumbosacral region: Secondary | ICD-10-CM | POA: Diagnosis not present

## 2018-06-12 DIAGNOSIS — Z780 Asymptomatic menopausal state: Secondary | ICD-10-CM | POA: Diagnosis not present

## 2018-06-12 DIAGNOSIS — M47819 Spondylosis without myelopathy or radiculopathy, site unspecified: Secondary | ICD-10-CM | POA: Diagnosis not present

## 2018-06-12 DIAGNOSIS — R7303 Prediabetes: Secondary | ICD-10-CM | POA: Diagnosis not present

## 2018-06-12 DIAGNOSIS — F418 Other specified anxiety disorders: Secondary | ICD-10-CM | POA: Diagnosis not present

## 2018-06-12 DIAGNOSIS — E7800 Pure hypercholesterolemia, unspecified: Secondary | ICD-10-CM | POA: Diagnosis not present

## 2018-06-12 DIAGNOSIS — Z0000 Encounter for general adult medical examination without abnormal findings: Secondary | ICD-10-CM | POA: Diagnosis not present

## 2018-06-12 DIAGNOSIS — I10 Essential (primary) hypertension: Secondary | ICD-10-CM | POA: Diagnosis not present

## 2018-06-12 DIAGNOSIS — F329 Major depressive disorder, single episode, unspecified: Secondary | ICD-10-CM | POA: Diagnosis not present

## 2018-06-12 DIAGNOSIS — Z136 Encounter for screening for cardiovascular disorders: Secondary | ICD-10-CM | POA: Diagnosis not present

## 2018-06-12 DIAGNOSIS — F419 Anxiety disorder, unspecified: Secondary | ICD-10-CM | POA: Diagnosis not present

## 2018-07-04 DIAGNOSIS — M48062 Spinal stenosis, lumbar region with neurogenic claudication: Secondary | ICD-10-CM | POA: Diagnosis not present

## 2018-07-04 DIAGNOSIS — I10 Essential (primary) hypertension: Secondary | ICD-10-CM | POA: Diagnosis not present

## 2018-07-13 ENCOUNTER — Encounter: Payer: Self-pay | Admitting: Internal Medicine

## 2018-07-16 ENCOUNTER — Other Ambulatory Visit: Payer: Self-pay | Admitting: Internal Medicine

## 2018-07-16 DIAGNOSIS — R7303 Prediabetes: Secondary | ICD-10-CM

## 2018-07-19 DIAGNOSIS — L821 Other seborrheic keratosis: Secondary | ICD-10-CM | POA: Diagnosis not present

## 2018-07-19 DIAGNOSIS — D485 Neoplasm of uncertain behavior of skin: Secondary | ICD-10-CM | POA: Diagnosis not present

## 2018-07-19 DIAGNOSIS — D2262 Melanocytic nevi of left upper limb, including shoulder: Secondary | ICD-10-CM | POA: Diagnosis not present

## 2018-07-19 DIAGNOSIS — D2272 Melanocytic nevi of left lower limb, including hip: Secondary | ICD-10-CM | POA: Diagnosis not present

## 2018-07-19 DIAGNOSIS — R05 Cough: Secondary | ICD-10-CM | POA: Diagnosis not present

## 2018-07-19 DIAGNOSIS — D2271 Melanocytic nevi of right lower limb, including hip: Secondary | ICD-10-CM | POA: Diagnosis not present

## 2018-07-24 ENCOUNTER — Other Ambulatory Visit (INDEPENDENT_AMBULATORY_CARE_PROVIDER_SITE_OTHER): Payer: BLUE CROSS/BLUE SHIELD

## 2018-07-24 ENCOUNTER — Encounter: Payer: Self-pay | Admitting: Internal Medicine

## 2018-07-24 DIAGNOSIS — R7303 Prediabetes: Secondary | ICD-10-CM | POA: Diagnosis not present

## 2018-07-24 LAB — HEMOGLOBIN A1C: Hgb A1c MFr Bld: 5.9 % (ref 4.6–6.5)

## 2018-08-08 ENCOUNTER — Encounter: Payer: Self-pay | Admitting: Internal Medicine

## 2018-08-14 ENCOUNTER — Encounter: Payer: Self-pay | Admitting: Internal Medicine

## 2018-08-19 ENCOUNTER — Other Ambulatory Visit: Payer: Self-pay | Admitting: Internal Medicine

## 2018-08-19 DIAGNOSIS — K2100 Gastro-esophageal reflux disease with esophagitis, without bleeding: Secondary | ICD-10-CM

## 2018-09-12 DIAGNOSIS — H2513 Age-related nuclear cataract, bilateral: Secondary | ICD-10-CM | POA: Diagnosis not present

## 2018-09-12 DIAGNOSIS — H33302 Unspecified retinal break, left eye: Secondary | ICD-10-CM | POA: Diagnosis not present

## 2018-09-12 DIAGNOSIS — H5213 Myopia, bilateral: Secondary | ICD-10-CM | POA: Diagnosis not present

## 2018-10-08 DIAGNOSIS — L602 Onychogryphosis: Secondary | ICD-10-CM | POA: Diagnosis not present

## 2018-10-08 DIAGNOSIS — M205X1 Other deformities of toe(s) (acquired), right foot: Secondary | ICD-10-CM | POA: Diagnosis not present

## 2018-10-08 DIAGNOSIS — M205X2 Other deformities of toe(s) (acquired), left foot: Secondary | ICD-10-CM | POA: Diagnosis not present

## 2018-11-23 ENCOUNTER — Encounter: Payer: Self-pay | Admitting: Internal Medicine

## 2018-11-23 ENCOUNTER — Other Ambulatory Visit: Payer: Self-pay | Admitting: Internal Medicine

## 2018-11-23 ENCOUNTER — Other Ambulatory Visit (INDEPENDENT_AMBULATORY_CARE_PROVIDER_SITE_OTHER): Payer: BLUE CROSS/BLUE SHIELD

## 2018-11-23 DIAGNOSIS — L299 Pruritus, unspecified: Secondary | ICD-10-CM

## 2018-11-23 DIAGNOSIS — I10 Essential (primary) hypertension: Secondary | ICD-10-CM

## 2018-11-23 LAB — CBC WITH DIFFERENTIAL/PLATELET
BASOS PCT: 1 % (ref 0.0–3.0)
Basophils Absolute: 0.1 10*3/uL (ref 0.0–0.1)
EOS ABS: 0.1 10*3/uL (ref 0.0–0.7)
Eosinophils Relative: 2.3 % (ref 0.0–5.0)
HEMATOCRIT: 44.2 % (ref 36.0–46.0)
Hemoglobin: 15.2 g/dL — ABNORMAL HIGH (ref 12.0–15.0)
Lymphocytes Relative: 26 % (ref 12.0–46.0)
Lymphs Abs: 1.5 10*3/uL (ref 0.7–4.0)
MCHC: 34.5 g/dL (ref 30.0–36.0)
MCV: 90.3 fl (ref 78.0–100.0)
Monocytes Absolute: 0.5 10*3/uL (ref 0.1–1.0)
Monocytes Relative: 8 % (ref 3.0–12.0)
Neutro Abs: 3.6 10*3/uL (ref 1.4–7.7)
Neutrophils Relative %: 62.7 % (ref 43.0–77.0)
Platelets: 225 10*3/uL (ref 150.0–400.0)
RBC: 4.89 Mil/uL (ref 3.87–5.11)
RDW: 12.5 % (ref 11.5–15.5)
WBC: 5.7 10*3/uL (ref 4.0–10.5)

## 2018-11-23 LAB — COMPREHENSIVE METABOLIC PANEL
ALK PHOS: 45 U/L (ref 39–117)
ALT: 20 U/L (ref 0–35)
AST: 15 U/L (ref 0–37)
Albumin: 4.8 g/dL (ref 3.5–5.2)
BILIRUBIN TOTAL: 0.6 mg/dL (ref 0.2–1.2)
BUN: 22 mg/dL (ref 6–23)
CO2: 30 meq/L (ref 19–32)
CREATININE: 0.93 mg/dL (ref 0.40–1.20)
Calcium: 9.6 mg/dL (ref 8.4–10.5)
Chloride: 102 mEq/L (ref 96–112)
GFR: 59.44 mL/min — ABNORMAL LOW (ref >60.00)
GLUCOSE: 91 mg/dL (ref 70–99)
Potassium: 3.7 mEq/L (ref 3.5–5.1)
Sodium: 141 mEq/L (ref 135–145)
Total Protein: 6.8 g/dL (ref 6.0–8.3)

## 2018-11-23 LAB — SEDIMENTATION RATE: Sed Rate: 3 mm/hr (ref 0–30)

## 2018-11-23 LAB — TSH: TSH: 2.26 u[IU]/mL (ref 0.35–4.50)

## 2018-11-25 ENCOUNTER — Encounter: Payer: Self-pay | Admitting: Internal Medicine

## 2019-02-14 ENCOUNTER — Encounter: Payer: Self-pay | Admitting: Internal Medicine

## 2019-02-14 NOTE — Telephone Encounter (Signed)
MD and CMA is put of the office until next week. Pls advise on email.Marland KitchenJohny Chess

## 2019-02-15 ENCOUNTER — Telehealth: Payer: Self-pay

## 2019-02-15 NOTE — Telephone Encounter (Signed)
Copied from [place] [phone]. Topic: General - Inquiry >> Feb 13, 2019  2:41 PM Richardo Priest, NT wrote: Reason for CRM: Patient is calling in stating that she may be in need of getting the new shingles vaccine. States she has had shingles before, however as did her husband they gave him the new vaccine even though he has had the prior vaccine with only 1 shot. Patient is wanting a call back to see if she should also get the new vaccine. Please advise.  Routing to dr Ronnald Ramp, please advise, I will call patient back, thanks

## 2019-02-18 MED ORDER — ZOSTER VAC RECOMB ADJUVANTED 50 MCG/0.5ML IM SUSR: 0.5000 mL | Freq: Once | INTRAMUSCULAR | 1 refills | Status: AC

## 2019-02-18 NOTE — Addendum Note (Signed)
Addended by: Karle Barr on: 02/18/2019 10:48 AM   Modules accepted: Orders

## 2019-02-18 NOTE — Telephone Encounter (Signed)
lvm for pt informed that shingrix is recommended and that we would send to the pharmacy. Requested pt to call back to confirm the pharmacy she would like [place] to send it to.

## 2019-02-18 NOTE — Telephone Encounter (Signed)
Pt called back and stated that the shingrix vac should go to [facility]. Erx has been sent.

## 2019-02-20 DIAGNOSIS — M48062 Spinal stenosis, lumbar region with neurogenic claudication: Secondary | ICD-10-CM | POA: Diagnosis not present

## 2019-02-20 DIAGNOSIS — I10 Essential (primary) hypertension: Secondary | ICD-10-CM | POA: Diagnosis not present

## 2019-02-20 DIAGNOSIS — M5136 Other intervertebral disc degeneration, lumbar region: Secondary | ICD-10-CM | POA: Diagnosis not present

## 2019-02-20 DIAGNOSIS — Z6825 Body mass index (BMI) 25.0-25.9, adult: Secondary | ICD-10-CM | POA: Diagnosis not present

## 2019-02-27 ENCOUNTER — Encounter: Payer: Self-pay | Admitting: Internal Medicine

## 2019-04-02 DIAGNOSIS — M5416 Radiculopathy, lumbar region: Secondary | ICD-10-CM | POA: Diagnosis not present

## 2019-04-02 DIAGNOSIS — M5116 Intervertebral disc disorders with radiculopathy, lumbar region: Secondary | ICD-10-CM | POA: Diagnosis not present

## 2019-05-17 ENCOUNTER — Ambulatory Visit (INDEPENDENT_AMBULATORY_CARE_PROVIDER_SITE_OTHER): Payer: BC Managed Care – PPO

## 2019-05-17 ENCOUNTER — Other Ambulatory Visit: Payer: Self-pay

## 2019-05-17 DIAGNOSIS — Z23 Encounter for immunization: Secondary | ICD-10-CM

## 2019-05-21 ENCOUNTER — Other Ambulatory Visit: Payer: Self-pay | Admitting: Obstetrics & Gynecology

## 2019-05-21 DIAGNOSIS — Z1231 Encounter for screening mammogram for malignant neoplasm of breast: Secondary | ICD-10-CM

## 2019-06-17 DIAGNOSIS — M48062 Spinal stenosis, lumbar region with neurogenic claudication: Secondary | ICD-10-CM | POA: Diagnosis not present

## 2019-06-17 DIAGNOSIS — M5136 Other intervertebral disc degeneration, lumbar region: Secondary | ICD-10-CM | POA: Diagnosis not present

## 2019-06-19 ENCOUNTER — Other Ambulatory Visit (HOSPITAL_COMMUNITY): Payer: Self-pay | Admitting: Neurological Surgery

## 2019-06-19 ENCOUNTER — Other Ambulatory Visit: Payer: Self-pay | Admitting: Neurological Surgery

## 2019-06-19 DIAGNOSIS — M51369 Other intervertebral disc degeneration, lumbar region without mention of lumbar back pain or lower extremity pain: Secondary | ICD-10-CM

## 2019-06-19 DIAGNOSIS — M5136 Other intervertebral disc degeneration, lumbar region: Secondary | ICD-10-CM

## 2019-06-26 ENCOUNTER — Other Ambulatory Visit: Payer: Self-pay

## 2019-06-26 ENCOUNTER — Ambulatory Visit (HOSPITAL_COMMUNITY)
Admission: RE | Admit: 2019-06-26 | Discharge: 2019-06-26 | Disposition: A | Discharge status: Home / Self Care | Payer: BC Managed Care – PPO | Source: Ambulatory Visit | Attending: Neurological Surgery | Admitting: Neurological Surgery

## 2019-06-26 DIAGNOSIS — M4807 Spinal stenosis, lumbosacral region: Secondary | ICD-10-CM | POA: Diagnosis not present

## 2019-06-26 DIAGNOSIS — M5136 Other intervertebral disc degeneration, lumbar region: Secondary | ICD-10-CM | POA: Diagnosis not present

## 2019-06-26 DIAGNOSIS — M5126 Other intervertebral disc displacement, lumbar region: Secondary | ICD-10-CM | POA: Diagnosis not present

## 2019-06-26 DIAGNOSIS — N200 Calculus of kidney: Secondary | ICD-10-CM | POA: Insufficient documentation

## 2019-06-26 DIAGNOSIS — M48062 Spinal stenosis, lumbar region with neurogenic claudication: Secondary | ICD-10-CM | POA: Diagnosis not present

## 2019-06-26 DIAGNOSIS — M5416 Radiculopathy, lumbar region: Secondary | ICD-10-CM | POA: Diagnosis not present

## 2019-06-26 MED ORDER — DIAZEPAM 5 MG PO TABS
ORAL_TABLET | ORAL | Status: AC
  Administered 2019-06-26: 10 mg via ORAL
  Filled 2019-06-26: qty 2

## 2019-06-26 MED ORDER — LIDOCAINE HCL (PF) 1 % IJ SOLN
5.0000 mL | Freq: Once | INTRAMUSCULAR | Status: AC
  Administered 2019-06-26: 3 mL via INTRADERMAL

## 2019-06-26 MED ORDER — ONDANSETRON HCL 4 MG/2ML IJ SOLN: 4.0000 mg | Freq: Four times a day (QID) | INTRAMUSCULAR | Status: DC | PRN

## 2019-06-26 MED ORDER — DIAZEPAM 5 MG PO TABS
10.0000 mg | ORAL_TABLET | Freq: Once | ORAL | Status: AC
  Administered 2019-06-26: 07:00:00 10 mg via ORAL
  Filled 2019-06-26: qty 2

## 2019-06-26 MED ORDER — HYDROCODONE-ACETAMINOPHEN 5-325 MG PO TABS: 1.0000 | ORAL_TABLET | ORAL | Status: DC | PRN

## 2019-06-26 MED ORDER — IOHEXOL 180 MG/ML  SOLN
20.0000 mL | Freq: Once | INTRAMUSCULAR | Status: AC | PRN
  Administered 2019-06-26: 12 mL via INTRATHECAL

## 2019-06-26 NOTE — Discharge Instructions (Signed)
Myelogram and Lumbar Puncture Discharge Instructions  1. Go home and rest quietly for the next 24 hours.  It is important to lie flat for the next 24 hours.  Get up only to go to the restroom.  You may lie in the bed or on a couch on your back, your stomach, your left side or your right side.  You may have one pillow under your head.  You may have pillows between your knees while you are on your side or under your knees while you are on your back.  2. DO NOT drive today.  Recline the seat as far back as it will go, while still wearing your seat belt, on the way home.  3. You may get up to go to the bathroom as needed.  You may sit up for 10 minutes to eat.  You may resume your normal diet and medications unless otherwise indicated.  4. The incidence of headache, nausea, or vomiting is about 5% (one in 20 patients).  If you develop a headache, lie flat and drink plenty of fluids until the headache goes away.  Caffeinated beverages may be helpful.  If you develop severe nausea and vomiting or a headache that does not go away with flat bed rest, call Dr Elsner's office.  5. You may resume normal activities after your 24 hours of bed rest is over; however, do not exert yourself strongly or do any heavy lifting tomorrow.  6. Call your physician for a follow-up appointment.  The results of your myelogram will be sent directly to your physician by the following day.  7. If you have any questions or if complications develop after you arrive home, please call Dr Elsner's office.  Discharge instructions have been explained to the patient.  The patient, or the person responsible for the patient, fully understands these instructions.   

## 2019-06-26 NOTE — Procedures (Signed)
Ms. Kimberly Cox is a 73 year old individual whose had significant back and lower extremity pain.  She has been followed for a modest degenerative scoliosis and was noted to have some stenosis at L4-5.  She has developed right lumbar radicular pain which may be coming from the level of L2-3 where there is also noted to be some facet hypertrophy I advise doing a myelogram and post myelogram CAT scan with some motion films to obtain a dynamic study and better evaluate the pathoanatomy in these regions she is now undergoing the study..  Pre op Dx: Lumbar spinal stenosis degenerative scoliosis Post op Dx: Same Procedure: Lumbar myelogram Surgeon: Hasel Janish Puncture level: L 2- 3 Fluid color: Clear colorless Injection: Isovue 180, 12 mL Findings: Severe central stenosis L4-5 modest lateral recess stenosis L2-3 L3-4.  Further evaluation with CT scanning.

## 2019-06-27 ENCOUNTER — Encounter: Payer: Self-pay | Admitting: Internal Medicine

## 2019-06-28 DIAGNOSIS — M48062 Spinal stenosis, lumbar region with neurogenic claudication: Secondary | ICD-10-CM | POA: Diagnosis not present

## 2019-06-28 DIAGNOSIS — I10 Essential (primary) hypertension: Secondary | ICD-10-CM | POA: Diagnosis not present

## 2019-06-28 DIAGNOSIS — Z6825 Body mass index (BMI) 25.0-25.9, adult: Secondary | ICD-10-CM | POA: Diagnosis not present

## 2019-07-05 ENCOUNTER — Ambulatory Visit: Payer: BC Managed Care – PPO

## 2019-07-10 ENCOUNTER — Other Ambulatory Visit: Payer: Self-pay

## 2019-07-10 ENCOUNTER — Ambulatory Visit
Admission: RE | Admit: 2019-07-10 | Discharge: 2019-07-10 | Disposition: A | Discharge status: Home / Self Care | Payer: BC Managed Care – PPO | Source: Ambulatory Visit | Attending: Obstetrics & Gynecology | Admitting: Obstetrics & Gynecology

## 2019-07-10 DIAGNOSIS — Z1231 Encounter for screening mammogram for malignant neoplasm of breast: Secondary | ICD-10-CM | POA: Diagnosis not present

## 2019-07-16 DIAGNOSIS — M5416 Radiculopathy, lumbar region: Secondary | ICD-10-CM | POA: Diagnosis not present

## 2019-07-16 DIAGNOSIS — M5116 Intervertebral disc disorders with radiculopathy, lumbar region: Secondary | ICD-10-CM | POA: Diagnosis not present

## 2019-07-23 ENCOUNTER — Encounter: Payer: Self-pay | Admitting: Internal Medicine

## 2019-07-23 ENCOUNTER — Other Ambulatory Visit: Payer: Self-pay

## 2019-07-24 ENCOUNTER — Encounter: Payer: Self-pay | Admitting: Obstetrics & Gynecology

## 2019-07-24 ENCOUNTER — Ambulatory Visit: Payer: BC Managed Care – PPO | Admitting: Obstetrics & Gynecology

## 2019-07-24 VITALS — BP 130/78 | Ht 64.5 in | Wt 150.0 lb

## 2019-07-24 DIAGNOSIS — Z780 Asymptomatic menopausal state: Secondary | ICD-10-CM

## 2019-07-24 DIAGNOSIS — Z01419 Encounter for gynecological examination (general) (routine) without abnormal findings: Secondary | ICD-10-CM

## 2019-07-24 NOTE — Progress Notes (Signed)
Kimberly Cox 1946-07-05 [id number]   History:    73 y.o. G2P2L2 Married  RP:  Established patient presenting for annual gyn exam   HPI: Menopause, well on no HRT.  No PMB.  No pelvic pain.  Pelvic US 12/2016 completely negative.  Urine/BMs wnl.  Breasts wnl.  BMI 25.35.  Physically active, but limited by lower back pain with numbing in legs which is under investigation, Lumbar MRI scheduled.  Health Labs with Fam MD.  Past medical history,surgical history, family history and social history were all reviewed and documented in the EPIC chart.  Gynecologic History No LMP recorded. Patient is postmenopausal. Contraception: post menopausal status Last Pap: 04/2018. Results were: Negative Last mammogram: 07/2019. Results were: Negative Bone Density: 01/2017 Normal, repeat at 7 yrs Colonoscopy: Cologard 3 yrs ago  Obstetric History OB History  Gravida Para Term Preterm AB Living  2 2       2   SAB TAB Ectopic Multiple Live Births               # Outcome Date GA Lbr Len/2nd Weight Sex Delivery Anes PTL Lv  2 Para           1 Para              ROS: A ROS was performed and pertinent positives and negatives are included in the history.  GENERAL: No fevers or chills. HEENT: No change in vision, no earache, sore throat or sinus congestion. NECK: No pain or stiffness. CARDIOVASCULAR: No chest pain or pressure. No palpitations. PULMONARY: No shortness of breath, cough or wheeze. GASTROINTESTINAL: No abdominal pain, nausea, vomiting or diarrhea, melena or bright red blood per rectum. GENITOURINARY: No urinary frequency, urgency, hesitancy or dysuria. MUSCULOSKELETAL: No joint or muscle pain, no back pain, no recent trauma. DERMATOLOGIC: No rash, no itching, no lesions. ENDOCRINE: No polyuria, polydipsia, no heat or cold intolerance. No recent change in weight. HEMATOLOGICAL: No anemia or easy bruising or bleeding. NEUROLOGIC: No headache, seizures, numbness, tingling or weakness. PSYCHIATRIC: No  depression, no loss of interest in normal activity or change in sleep pattern.     Exam:   BP 130/78   Ht 5' 4.5" (1.638 m)   Wt 150 lb (68 kg)   BMI 25.35 kg/m   Body mass index is 25.35 kg/m.  General appearance : Well developed well nourished female. No acute distress HEENT: Eyes: no retinal hemorrhage or exudates,  Neck supple, trachea midline, no carotid bruits, no thyroidmegaly Lungs: Clear to auscultation, no rhonchi or wheezes, or rib retractions  Heart: Regular rate and rhythm, no murmurs or gallops Breast:Examined in sitting and supine position were symmetrical in appearance, no palpable masses or tenderness,  no skin retraction, no nipple inversion, no nipple discharge, no skin discoloration, no axillary or supraclavicular lymphadenopathy Abdomen: no palpable masses or tenderness, no rebound or guarding Extremities: no edema or skin discoloration or tenderness  Pelvic: Vulva: Normal             Vagina: No gross lesions or discharge  Cervix: No gross lesions or discharge  Uterus  AV, normal size, shape and consistency, non-tender and mobile  Adnexa  Without masses or tenderness  Anus: Normal   Assessment/Plan:  73 y.o. female for annual exam   1. Well female exam with routine gynecological exam Normal gynecologic exam in menopause.  Pap test August 2019 was negative, no indication to repeat this year.  Breast exam normal.  Screening mammogram November 2020  was negative.  Cologuard 3 years ago.  Health labs with family physician.  Body mass index good at 25.35.  Continue with fitness and healthy nutrition.  2. Postmenopause Well on no hormone replacement therapy.  No postmenopausal bleeding.  Bone density in May 2018 was normal.  Repeat a bone density at 3 years.  Continue with vitamin D supplements, calcium intake of 1200 mg daily and regular weightbearing physical activities.  Other orders - co-enzyme Q-10 30 MG capsule; Take 30 mg by mouth 3 (three) times daily.   [name] MD, 3:52 PM 07/24/2019

## 2019-07-29 ENCOUNTER — Encounter: Payer: Self-pay | Admitting: Obstetrics & Gynecology

## 2019-07-29 NOTE — Patient Instructions (Signed)
1. Well female exam with routine gynecological exam Normal gynecologic exam in menopause.  Pap test August 2019 was negative, no indication to repeat this year.  Breast exam normal.  Screening mammogram November 2020 was negative.  Cologuard 3 years ago.  Health labs with family physician.  Body mass index good at 25.35.  Continue with fitness and healthy nutrition.  2. Postmenopause Well on no hormone replacement therapy.  No postmenopausal bleeding.  Bone density in May 2018 was normal.  Repeat a bone density at 3 years.  Continue with vitamin D supplements, calcium intake of 1200 mg daily and regular weightbearing physical activities.  Other orders - co-enzyme Q-10 30 MG capsule; Take 30 mg by mouth 3 (three) times daily.  Kimberly Cox, it was a pleasure seeing you today!

## 2019-09-23 DIAGNOSIS — H5213 Myopia, bilateral: Secondary | ICD-10-CM | POA: Diagnosis not present

## 2019-09-23 DIAGNOSIS — H2513 Age-related nuclear cataract, bilateral: Secondary | ICD-10-CM | POA: Diagnosis not present

## 2019-09-23 DIAGNOSIS — H353111 Nonexudative age-related macular degeneration, right eye, early dry stage: Secondary | ICD-10-CM | POA: Diagnosis not present

## 2019-09-26 DIAGNOSIS — L820 Inflamed seborrheic keratosis: Secondary | ICD-10-CM | POA: Diagnosis not present

## 2019-09-26 DIAGNOSIS — I8391 Asymptomatic varicose veins of right lower extremity: Secondary | ICD-10-CM | POA: Diagnosis not present

## 2019-09-26 DIAGNOSIS — D2272 Melanocytic nevi of left lower limb, including hip: Secondary | ICD-10-CM | POA: Diagnosis not present

## 2019-09-26 DIAGNOSIS — L814 Other melanin hyperpigmentation: Secondary | ICD-10-CM | POA: Diagnosis not present

## 2019-09-26 DIAGNOSIS — L570 Actinic keratosis: Secondary | ICD-10-CM | POA: Diagnosis not present

## 2019-09-26 DIAGNOSIS — I8392 Asymptomatic varicose veins of left lower extremity: Secondary | ICD-10-CM | POA: Diagnosis not present

## 2019-10-03 ENCOUNTER — Ambulatory Visit: Payer: BC Managed Care – PPO

## 2019-10-06 DIAGNOSIS — Z23 Encounter for immunization: Secondary | ICD-10-CM | POA: Diagnosis not present

## 2019-10-12 ENCOUNTER — Ambulatory Visit: Payer: BC Managed Care – PPO

## 2019-10-24 ENCOUNTER — Ambulatory Visit: Payer: BC Managed Care – PPO

## 2019-11-03 DIAGNOSIS — Z23 Encounter for immunization: Secondary | ICD-10-CM | POA: Diagnosis not present

## 2019-12-18 DIAGNOSIS — E7800 Pure hypercholesterolemia, unspecified: Secondary | ICD-10-CM | POA: Diagnosis not present

## 2019-12-18 DIAGNOSIS — Z780 Asymptomatic menopausal state: Secondary | ICD-10-CM | POA: Diagnosis not present

## 2019-12-18 DIAGNOSIS — Z13820 Encounter for screening for osteoporosis: Secondary | ICD-10-CM | POA: Diagnosis not present

## 2019-12-18 DIAGNOSIS — L659 Nonscarring hair loss, unspecified: Secondary | ICD-10-CM | POA: Diagnosis not present

## 2019-12-18 DIAGNOSIS — F329 Major depressive disorder, single episode, unspecified: Secondary | ICD-10-CM | POA: Diagnosis not present

## 2019-12-18 DIAGNOSIS — Z0000 Encounter for general adult medical examination without abnormal findings: Secondary | ICD-10-CM | POA: Diagnosis not present

## 2019-12-18 DIAGNOSIS — I10 Essential (primary) hypertension: Secondary | ICD-10-CM | POA: Diagnosis not present

## 2019-12-18 DIAGNOSIS — R7303 Prediabetes: Secondary | ICD-10-CM | POA: Diagnosis not present

## 2019-12-18 DIAGNOSIS — F419 Anxiety disorder, unspecified: Secondary | ICD-10-CM | POA: Diagnosis not present

## 2019-12-18 DIAGNOSIS — Z136 Encounter for screening for cardiovascular disorders: Secondary | ICD-10-CM | POA: Diagnosis not present

## 2019-12-18 DIAGNOSIS — M47817 Spondylosis without myelopathy or radiculopathy, lumbosacral region: Secondary | ICD-10-CM | POA: Diagnosis not present

## 2019-12-18 DIAGNOSIS — Z1321 Encounter for screening for nutritional disorder: Secondary | ICD-10-CM | POA: Diagnosis not present

## 2019-12-25 DIAGNOSIS — M47816 Spondylosis without myelopathy or radiculopathy, lumbar region: Secondary | ICD-10-CM | POA: Diagnosis not present

## 2019-12-25 DIAGNOSIS — M9933 Osseous stenosis of neural canal of lumbar region: Secondary | ICD-10-CM | POA: Diagnosis not present

## 2019-12-25 DIAGNOSIS — M4316 Spondylolisthesis, lumbar region: Secondary | ICD-10-CM | POA: Diagnosis not present

## 2019-12-25 DIAGNOSIS — M47817 Spondylosis without myelopathy or radiculopathy, lumbosacral region: Secondary | ICD-10-CM | POA: Diagnosis not present

## 2020-01-27 DIAGNOSIS — M5416 Radiculopathy, lumbar region: Secondary | ICD-10-CM | POA: Diagnosis not present

## 2020-01-27 DIAGNOSIS — M5116 Intervertebral disc disorders with radiculopathy, lumbar region: Secondary | ICD-10-CM | POA: Diagnosis not present

## 2020-01-27 DIAGNOSIS — M48062 Spinal stenosis, lumbar region with neurogenic claudication: Secondary | ICD-10-CM | POA: Diagnosis not present

## 2020-02-26 DIAGNOSIS — M48062 Spinal stenosis, lumbar region with neurogenic claudication: Secondary | ICD-10-CM | POA: Diagnosis not present

## 2020-02-26 DIAGNOSIS — M5136 Other intervertebral disc degeneration, lumbar region: Secondary | ICD-10-CM | POA: Diagnosis not present

## 2020-03-30 ENCOUNTER — Ambulatory Visit (INDEPENDENT_AMBULATORY_CARE_PROVIDER_SITE_OTHER): Payer: BC Managed Care – PPO

## 2020-03-30 ENCOUNTER — Other Ambulatory Visit: Payer: Self-pay

## 2020-03-30 ENCOUNTER — Encounter: Payer: Self-pay | Admitting: Internal Medicine

## 2020-03-30 ENCOUNTER — Ambulatory Visit: Payer: BC Managed Care – PPO | Admitting: Internal Medicine

## 2020-03-30 VITALS — BP 128/80 | HR 75 | Temp 98.4°F | Resp 16 | Ht 64.5 in | Wt 155.0 lb

## 2020-03-30 DIAGNOSIS — Z0000 Encounter for general adult medical examination without abnormal findings: Secondary | ICD-10-CM

## 2020-03-30 DIAGNOSIS — M1811 Unilateral primary osteoarthritis of first carpometacarpal joint, right hand: Secondary | ICD-10-CM | POA: Diagnosis not present

## 2020-03-30 DIAGNOSIS — M19041 Primary osteoarthritis, right hand: Secondary | ICD-10-CM

## 2020-03-30 DIAGNOSIS — M19042 Primary osteoarthritis, left hand: Secondary | ICD-10-CM | POA: Diagnosis not present

## 2020-03-30 DIAGNOSIS — M79641 Pain in right hand: Secondary | ICD-10-CM

## 2020-03-30 DIAGNOSIS — M25542 Pain in joints of left hand: Secondary | ICD-10-CM

## 2020-03-30 DIAGNOSIS — E781 Pure hyperglyceridemia: Secondary | ICD-10-CM

## 2020-03-30 DIAGNOSIS — E876 Hypokalemia: Secondary | ICD-10-CM

## 2020-03-30 DIAGNOSIS — Z1211 Encounter for screening for malignant neoplasm of colon: Secondary | ICD-10-CM

## 2020-03-30 DIAGNOSIS — K2100 Gastro-esophageal reflux disease with esophagitis, without bleeding: Secondary | ICD-10-CM | POA: Diagnosis not present

## 2020-03-30 DIAGNOSIS — Z23 Encounter for immunization: Secondary | ICD-10-CM

## 2020-03-30 DIAGNOSIS — I10 Essential (primary) hypertension: Secondary | ICD-10-CM

## 2020-03-30 DIAGNOSIS — M1812 Unilateral primary osteoarthritis of first carpometacarpal joint, left hand: Secondary | ICD-10-CM | POA: Diagnosis not present

## 2020-03-30 DIAGNOSIS — E785 Hyperlipidemia, unspecified: Secondary | ICD-10-CM

## 2020-03-30 DIAGNOSIS — L2084 Intrinsic (allergic) eczema: Secondary | ICD-10-CM

## 2020-03-30 DIAGNOSIS — T502X5A Adverse effect of carbonic-anhydrase inhibitors, benzothiadiazides and other diuretics, initial encounter: Secondary | ICD-10-CM

## 2020-03-30 DIAGNOSIS — M79642 Pain in left hand: Secondary | ICD-10-CM

## 2020-03-30 DIAGNOSIS — R7303 Prediabetes: Secondary | ICD-10-CM

## 2020-03-30 MED ORDER — ZOSTER VAC RECOMB ADJUVANTED 50 MCG/0.5ML IM SUSR
0.5000 mL | Freq: Once | INTRAMUSCULAR | 1 refills | Status: AC
Start: 2020-03-30 — End: 2020-03-30

## 2020-03-30 MED ORDER — RELAFEN DS 1000 MG PO TABS: 1.0000 | ORAL_TABLET | Freq: Every day | ORAL | 0 refills | Status: DC | PRN

## 2020-03-30 MED ORDER — OMEPRAZOLE 40 MG PO CPDR: 40.0000 mg | DELAYED_RELEASE_CAPSULE | Freq: Every day | ORAL | 1 refills | Status: DC

## 2020-03-30 NOTE — Progress Notes (Signed)
Subjective:  Patient ID: Kimberly Cox, female    DOB: 03-Aug-1946  Age: 74 y.o. MRN: [id number]  CC: Gastroesophageal Reflux, Osteoarthritis, Hyperlipidemia, Hypertension, and Annual Exam  This visit occurred during the SARS-CoV-2 public health emergency.  Safety protocols were in place, including screening questions prior to the visit, additional usage of staff PPE, and extensive cleaning of exam room while observing appropriate contact time as indicated for disinfecting solutions.    HPI Kimberly Cox presents for a CPX.  She complains of chronic and somewhat worsening pain in her hands mostly in the MCP joints.  The symptoms worsened about 2 weeks ago and she noticed a deformity in the right third MCP joint.  She denies any trauma or injury.  She is not noticed any redness or swelling.  She is not getting much symptom relief with Tylenol.  She tells me her blood pressure has been well controlled.  She denies headache, blurred vision, chest pain, shortness of breath, palpitations, edema, or fatigue.  Outpatient Medications Prior to Visit  Medication Sig Dispense Refill  . clobetasol (TEMOVATE) 0.05 % external solution Apply topically daily.    Marland Kitchen co-enzyme Q-10 30 MG capsule Take 30 mg by mouth 3 (three) times daily.    . irbesartan-hydrochlorothiazide (AVALIDE) 150-12.5 MG tablet Take 1 tablet by mouth daily.    . Lactobacillus (ACIDOPHILUS) 10 MG CAPS Take 10 mg by mouth daily.     . Omega-3 Fatty Acids (FISH OIL) 1000 MG CAPS Take 2,400 mg by mouth daily.     . rosuvastatin (CRESTOR) 5 MG tablet Take 5 mg by mouth daily.   3  . omeprazole (PRILOSEC) 40 MG capsule Take 40 mg by mouth daily.    . Biotin 1000 MCG tablet Take 5,000 mcg by mouth daily.     . irbesartan-hydrochlorothiazide (AVALIDE) 300-12.5 MG tablet Take 0.5 tablets by mouth daily.     No facility-administered medications prior to visit.    ROS Review of Systems  Constitutional: Negative for diaphoresis, fatigue  and unexpected weight change.  HENT: Negative.   Eyes: Negative for visual disturbance.  Respiratory: Negative for cough, chest tightness, shortness of breath and wheezing.   Cardiovascular: Negative for chest pain, palpitations and leg swelling.  Gastrointestinal: Negative for abdominal pain, constipation, diarrhea, nausea and vomiting.  Endocrine: Negative.   Genitourinary: Negative.  Negative for difficulty urinating.  Musculoskeletal: Positive for arthralgias and back pain. Negative for myalgias and neck pain.  Skin: Negative for color change, pallor and rash.  Neurological: Negative.  Negative for dizziness, weakness, light-headedness, numbness and headaches.  Hematological: Negative for adenopathy. Does not bruise/bleed easily.  Psychiatric/Behavioral: Negative.     Objective:  BP 128/80 (BP Location: Left Arm, Patient Position: Sitting, Cuff Size: Normal)   Pulse 75   Temp 98.4 F (36.9 C) (Oral)   Resp 16   Ht 5' 4.5" (1.638 m)   Wt 155 lb (70.3 kg)   SpO2 98%   BMI 26.19 kg/m   BP Readings from Last 3 Encounters:  03/30/20 128/80  07/24/19 130/78  06/26/19 (!) 145/72    Wt Readings from Last 3 Encounters:  03/30/20 155 lb (70.3 kg)  07/24/19 150 lb (68 kg)  06/26/19 150 lb (68 kg)    Physical Exam Vitals reviewed.  Constitutional:      Appearance: Normal appearance.  HENT:     Nose: Nose normal.     Mouth/Throat:     Mouth: Mucous membranes are moist.  Eyes:  General: No scleral icterus.    Conjunctiva/sclera: Conjunctivae normal.  Cardiovascular:     Rate and Rhythm: Normal rate and regular rhythm.     Heart sounds: No murmur heard.   Pulmonary:     Effort: Pulmonary effort is normal.     Breath sounds: No stridor. No wheezing, rhonchi or rales.  Abdominal:     General: Abdomen is flat. Bowel sounds are normal. There is no distension.     Palpations: Abdomen is soft. There is no hepatomegaly, splenomegaly or mass.     Tenderness: There is no  abdominal tenderness.  Musculoskeletal:        General: Normal range of motion.     Right hand: Deformity and bony tenderness present. No swelling. Normal range of motion. Normal strength.     Left hand: Deformity and bony tenderness present. No swelling. Normal range of motion. Normal strength.     Cervical back: Neck supple.     Right lower leg: No edema.     Left lower leg: No edema.     Comments: There are changes in both hands mostly over the MCP joints consistent with DJD.  There is some spurring noted in the MCP joints but most prominently on the dorsum of the right third MCP joint.  Skin:    General: Skin is warm and dry.     Coloration: Skin is not pale.     Findings: No rash.  Neurological:     General: No focal deficit present.     Mental Status: She is alert and oriented to person, place, and time. Mental status is at baseline.  Psychiatric:        Mood and Affect: Mood normal.        Behavior: Behavior normal.     Lab Results  Component Value Date   WBC 5.5 03/30/2020   HGB 14.5 03/30/2020   HCT 43.5 03/30/2020   PLT 267 03/30/2020   GLUCOSE 94 03/30/2020   CHOL 171 03/30/2020   TRIG 167 (H) 03/30/2020   HDL 55 03/30/2020   LDLDIRECT 100.0 05/01/2018   LDLCALC 89 03/30/2020   ALT 22 03/30/2020   AST 17 03/30/2020   NA 142 03/30/2020   K 3.4 (L) 03/30/2020   CL 103 03/30/2020   CREATININE 0.83 03/30/2020   BUN 17 03/30/2020   CO2 30 03/30/2020   TSH 1.06 03/30/2020   HGBA1C 5.9 07/24/2018    MM 3D SCREEN BREAST BILATERAL  Result Date: 07/11/2019 CLINICAL DATA:  Screening. EXAM: DIGITAL SCREENING BILATERAL MAMMOGRAM WITH TOMO AND CAD COMPARISON:  Previous exam(s). ACR Breast Density Category c: The breast tissue is heterogeneously dense, which may obscure small masses. FINDINGS: There are no findings suspicious for malignancy. Images were processed with CAD. IMPRESSION: No mammographic evidence of malignancy. A result letter of this screening mammogram will  be mailed directly to the patient. RECOMMENDATION: Screening mammogram in one year. (Code:SM-B-01Y) BI-RADS CATEGORY  1: Negative. Electronically Signed   By: Elberta Fortisaniel  Boyle M.D.   On: 07/11/2019 10:40   DG Hand Complete Left  Result Date: 03/30/2020 CLINICAL DATA:  Chronic bilateral hand pain EXAM: LEFT HAND - COMPLETE 3+ VIEW COMPARISON:  None. FINDINGS: Mild osteoarthritis changes with joint space narrowing and spurring diffusely throughout the IP joints and at the 1st carpometacarpal joint. No acute bony abnormality. Specifically, no fracture, subluxation, or dislocation. Soft tissues are intact. IMPRESSION: Mild osteoarthritis within the left hand as above. No acute bony abnormality. Electronically Signed  By: Charlett Nose M.D.   On: 03/30/2020 11:49   DG Hand Complete Right  Result Date: 03/30/2020 CLINICAL DATA:  Chronic bilateral hand pain EXAM: RIGHT HAND - COMPLETE 3+ VIEW COMPARISON:  None. FINDINGS: Joint space narrowing and early spurring throughout the IP joints. No bony erosions. Early osteoarthritic changes at 1st carpometacarpal joint. No acute bony abnormality. Specifically, no fracture, subluxation, or dislocation. Soft tissues are intact. IMPRESSION: Mild osteoarthritis in the IP joints diffusely and 1st carpometacarpal joint. No acute findings. Electronically Signed   By: Charlett Nose M.D.   On: 03/30/2020 11:49    Assessment & Plan:   Kimberly Cox was seen today for gastroesophageal reflux, osteoarthritis, hyperlipidemia, hypertension and annual exam.  Diagnoses and all orders for this visit:  Gastroesophageal reflux disease with esophagitis without hemorrhage- Her symptoms are well controlled with the PPI.  Will continue. -     omeprazole (PRILOSEC) 40 MG capsule; Take 1 capsule (40 mg total) by mouth daily. -     CBC with Differential/Platelet; Future -     CBC with Differential/Platelet  Primary osteoarthritis of both hands- Based on her symptoms, exam, and plain films she has  osteoarthritis.  I recommended that she treat this with nabumetone. -     DG Hand Complete Right; Future -     DG Hand Complete Left; Future -     Nabumetone (RELAFEN DS) 1000 MG TABS; Take 1 tablet by mouth daily as needed.  Pain in joint of left hand  Pain of left hand -     DG Hand Complete Left; Future  Hand pain, right -     DG Hand Complete Right; Future  Essential hypertension- Her blood pressure is adequately well controlled and she has developed hypokalemia.  Will continue the current regimen but I recommended that she start taking a potassium supplement. -     BASIC METABOLIC PANEL WITH GFR; Future -     Urinalysis, Routine w reflex microscopic; Future -     TSH; Future -     VITAMIN D 25 Hydroxy (Vit-D Deficiency, Fractures); Future -     VITAMIN D 25 Hydroxy (Vit-D Deficiency, Fractures) -     TSH -     Urinalysis, Routine w reflex microscopic -     BASIC METABOLIC PANEL WITH GFR -     potassium chloride SA (KLOR-CON) 20 MEQ tablet; Take 1 tablet (20 mEq total) by mouth 2 (two) times daily.  Prediabetes- Her blood sugar is normal now. -     BASIC METABOLIC PANEL WITH GFR; Future -     BASIC METABOLIC PANEL WITH GFR  Pure hyperglyceridemia- Her triglycerides are mildly elevated but not high enough to need to be treated.  She agrees to work on her lifestyle modifications.  Routine general medical examination at a health care facility- Exam completed, labs reviewed, vaccines reviewed and updated, screening for breast and cervical cancer is up-to-date, she is referred for colon cancer screening with a Cologuard.  Patient education was given. -     Lipid panel; Future -     Lipid panel  Hyperlipidemia LDL goal <130- She has achieved her LDL goal and is doing well on the statin. -     TSH; Future -     Hepatic function panel; Future -     Hepatic function panel -     TSH  Need for shingles vaccine -     Zoster Vaccine Adjuvanted [facility]) injection; Inject 0.5 mLs into  the muscle once for 1 dose. -     Cancel: Varicella-zoster vaccine subcutaneous -     Varicella-zoster vaccine IM (Shingrix)  Colon cancer screening -     Cologuard  Diuretic-induced hypokalemia -     potassium chloride SA (KLOR-CON) 20 MEQ tablet; Take 1 tablet (20 mEq total) by mouth 2 (two) times daily.  Other orders -     MICROSCOPIC MESSAGE   I have discontinued Kimberly Cox's Biotin. I have also changed her omeprazole. Additionally, I am having her start on Relafen DS, Zoster Vaccine Adjuvanted, and potassium chloride SA. Lastly, I am having her maintain her Acidophilus, Fish Oil, rosuvastatin, co-enzyme Q-10, clobetasol, and irbesartan-hydrochlorothiazide.  Meds ordered this encounter  Medications  . omeprazole (PRILOSEC) 40 MG capsule    Sig: Take 1 capsule (40 mg total) by mouth daily.    Dispense:  90 capsule    Refill:  1  . Nabumetone (RELAFEN DS) 1000 MG TABS    Sig: Take 1 tablet by mouth daily as needed.    Dispense:  10 tablet    Refill:  0  . Zoster Vaccine Adjuvanted [facility]) injection    Sig: Inject 0.5 mLs into the muscle once for 1 dose.    Dispense:  0.5 mL    Refill:  1  . potassium chloride SA (KLOR-CON) 20 MEQ tablet    Sig: Take 1 tablet (20 mEq total) by mouth 2 (two) times daily.    Dispense:  180 tablet    Refill:  0   In addition to time spent on CPE, I spent 50 minutes in preparing to see the patient by review of recent labs, imaging and procedures, obtaining and reviewing separately obtained history, communicating with the patient and family or caregiver, ordering medications, tests or procedures, and documenting clinical information in the EHR including the differential Dx, treatment, and any further evaluation and other management of 1. Gastroesophageal reflux disease with esophagitis without hemorrhage 2. Primary osteoarthritis of both hands 3. Pain in joint of left hand 4. Pain of left hand 5. Hand pain, right 6. Essential  hypertension 7. Prediabetes 8. Pure hyperglyceridemia 9. Hyperlipidemia LDL goal <130 10. Diuretic-induced hypokalemia     Follow-up: Return in about 8 weeks (around 05/25/2020).  Sanda Linger, MD

## 2020-03-30 NOTE — Patient Instructions (Signed)

## 2020-03-31 DIAGNOSIS — Z1211 Encounter for screening for malignant neoplasm of colon: Secondary | ICD-10-CM | POA: Insufficient documentation

## 2020-03-31 DIAGNOSIS — T502X5A Adverse effect of carbonic-anhydrase inhibitors, benzothiadiazides and other diuretics, initial encounter: Secondary | ICD-10-CM | POA: Insufficient documentation

## 2020-03-31 DIAGNOSIS — E876 Hypokalemia: Secondary | ICD-10-CM | POA: Insufficient documentation

## 2020-03-31 DIAGNOSIS — Z23 Encounter for immunization: Secondary | ICD-10-CM | POA: Insufficient documentation

## 2020-03-31 LAB — URINALYSIS, ROUTINE W REFLEX MICROSCOPIC
Bacteria, UA: NONE SEEN /HPF
Bilirubin Urine: NEGATIVE
Glucose, UA: NEGATIVE
Hgb urine dipstick: NEGATIVE
Hyaline Cast: NONE SEEN /LPF
Ketones, ur: NEGATIVE
Nitrite: NEGATIVE
Protein, ur: NEGATIVE
Specific Gravity, Urine: 1.023 (ref 1.001–1.03)
Squamous Epithelial / HPF: NONE SEEN /HPF (ref <=5)
pH: 6 (ref 5.0–8.0)

## 2020-03-31 LAB — LIPID PANEL
Cholesterol: 171 mg/dL (ref <200)
HDL: 55 mg/dL (ref >=50)
LDL Cholesterol (Calc): 89 mg/dL (calc)
Non-HDL Cholesterol (Calc): 116 mg/dL (calc) (ref <130)
Total CHOL/HDL Ratio: 3.1 (calc) (ref <5.0)
Triglycerides: 167 mg/dL — ABNORMAL HIGH (ref <150)

## 2020-03-31 LAB — CBC WITH DIFFERENTIAL/PLATELET
Absolute Monocytes: 391 cells/uL (ref 200–950)
Basophils Absolute: 50 cells/uL (ref 0–200)
Basophils Relative: 0.9 %
Eosinophils Absolute: 61 cells/uL (ref 15–500)
Eosinophils Relative: 1.1 %
HCT: 43.5 % (ref 35.0–45.0)
Hemoglobin: 14.5 g/dL (ref 11.7–15.5)
Lymphs Abs: 1282 cells/uL (ref 850–3900)
MCH: 31.3 pg (ref 27.0–33.0)
MCHC: 33.3 g/dL (ref 32.0–36.0)
MCV: 94 fL (ref 80.0–100.0)
MPV: 10.2 fL (ref 7.5–12.5)
Monocytes Relative: 7.1 %
Neutro Abs: 3718 cells/uL (ref 1500–7800)
Neutrophils Relative %: 67.6 %
Platelets: 267 10*3/uL (ref 140–400)
RBC: 4.63 10*6/uL (ref 3.80–5.10)
RDW: 13.4 % (ref 11.0–15.0)
Total Lymphocyte: 23.3 %
WBC: 5.5 10*3/uL (ref 3.8–10.8)

## 2020-03-31 LAB — VITAMIN D 25 HYDROXY (VIT D DEFICIENCY, FRACTURES): Vit D, 25-Hydroxy: 38 ng/mL (ref 30–100)

## 2020-03-31 LAB — BASIC METABOLIC PANEL WITH GFR
BUN: 17 mg/dL (ref 7–25)
CO2: 30 mmol/L (ref 20–32)
Calcium: 9.8 mg/dL (ref 8.6–10.4)
Chloride: 103 mmol/L (ref 98–110)
Creat: 0.83 mg/dL (ref 0.60–0.93)
GFR, Est African American: 82 mL/min/{1.73_m2} (ref >=60)
GFR, Est Non African American: 70 mL/min/{1.73_m2} (ref >=60)
Glucose, Bld: 94 mg/dL (ref 65–99)
Potassium: 3.4 mmol/L — ABNORMAL LOW (ref 3.5–5.3)
Sodium: 142 mmol/L (ref 135–146)

## 2020-03-31 LAB — HEPATIC FUNCTION PANEL
AG Ratio: 3.3 (calc) — ABNORMAL HIGH (ref 1.0–2.5)
ALT: 22 U/L (ref 6–29)
AST: 17 U/L (ref 10–35)
Albumin: 5 g/dL (ref 3.6–5.1)
Alkaline phosphatase (APISO): 41 U/L (ref 37–153)
Bilirubin, Direct: 0.1 mg/dL (ref 0.0–0.2)
Globulin: 1.5 g/dL (calc) — ABNORMAL LOW (ref 1.9–3.7)
Indirect Bilirubin: 0.7 mg/dL (calc) (ref 0.2–1.2)
Total Bilirubin: 0.8 mg/dL (ref 0.2–1.2)
Total Protein: 6.5 g/dL (ref 6.1–8.1)

## 2020-03-31 LAB — TSH: TSH: 1.06 mIU/L (ref 0.40–4.50)

## 2020-03-31 MED ORDER — POTASSIUM CHLORIDE CRYS ER 20 MEQ PO TBCR: 20.0000 meq | EXTENDED_RELEASE_TABLET | Freq: Two times a day (BID) | ORAL | 0 refills | Status: DC

## 2020-06-25 ENCOUNTER — Other Ambulatory Visit: Payer: Self-pay | Admitting: Obstetrics & Gynecology

## 2020-06-25 DIAGNOSIS — Z1231 Encounter for screening mammogram for malignant neoplasm of breast: Secondary | ICD-10-CM

## 2020-06-30 ENCOUNTER — Ambulatory Visit: Payer: BC Managed Care – PPO | Admitting: Internal Medicine

## 2020-06-30 ENCOUNTER — Encounter: Payer: Self-pay | Admitting: Internal Medicine

## 2020-06-30 ENCOUNTER — Other Ambulatory Visit: Payer: Self-pay

## 2020-06-30 VITALS — BP 138/88 | HR 76 | Temp 98.5°F | Resp 16 | Ht 64.5 in | Wt 154.0 lb

## 2020-06-30 DIAGNOSIS — Z23 Encounter for immunization: Secondary | ICD-10-CM | POA: Diagnosis not present

## 2020-06-30 DIAGNOSIS — T502X5A Adverse effect of carbonic-anhydrase inhibitors, benzothiadiazides and other diuretics, initial encounter: Secondary | ICD-10-CM | POA: Diagnosis not present

## 2020-06-30 DIAGNOSIS — I10 Essential (primary) hypertension: Secondary | ICD-10-CM | POA: Diagnosis not present

## 2020-06-30 DIAGNOSIS — E876 Hypokalemia: Secondary | ICD-10-CM

## 2020-06-30 LAB — BASIC METABOLIC PANEL
BUN: 17 mg/dL (ref 6–23)
CO2: 28 mEq/L (ref 19–32)
Calcium: 10 mg/dL (ref 8.4–10.5)
Chloride: 102 mEq/L (ref 96–112)
Creatinine, Ser: 0.8 mg/dL (ref 0.40–1.20)
GFR: 73.55 mL/min (ref >60.00)
Glucose, Bld: 95 mg/dL (ref 70–99)
Potassium: 3.9 mEq/L (ref 3.5–5.1)
Sodium: 140 mEq/L (ref 135–145)

## 2020-06-30 LAB — MAGNESIUM: Magnesium: 2.1 mg/dL (ref 1.5–2.5)

## 2020-06-30 NOTE — Progress Notes (Signed)
Subjective:  Patient ID: Kimberly Cox, female    DOB: 04-21-1946  Age: 74 y.o. MRN: [id number]  CC: Hypertension  This visit occurred during the SARS-CoV-2 public health emergency.  Safety protocols were in place, including screening questions prior to the visit, additional usage of staff PPE, and extensive cleaning of exam room while observing appropriate contact time as indicated for disinfecting solutions.    HPI Kimberly Cox presents for f/up - She has felt well recently and offers no complaints.  She is active and denies chest pain, shortness of breath, palpitations, edema, or fatigue.  Outpatient Medications Prior to Visit  Medication Sig Dispense Refill  . clobetasol (TEMOVATE) 0.05 % external solution Apply topically daily.    Marland Kitchen co-enzyme Q-10 30 MG capsule Take 30 mg by mouth 3 (three) times daily.    . irbesartan-hydrochlorothiazide (AVALIDE) 150-12.5 MG tablet Take 1 tablet by mouth daily.    . Lactobacillus (ACIDOPHILUS) 10 MG CAPS Take 10 mg by mouth daily.     . Nabumetone (RELAFEN DS) 1000 MG TABS Take 1 tablet by mouth daily as needed. 10 tablet 0  . Omega-3 Fatty Acids (FISH OIL) 1000 MG CAPS Take 2,400 mg by mouth daily.     Marland Kitchen omeprazole (PRILOSEC) 40 MG capsule Take 1 capsule (40 mg total) by mouth daily. 90 capsule 1  . potassium chloride SA (KLOR-CON) 20 MEQ tablet Take 1 tablet (20 mEq total) by mouth 2 (two) times daily. 180 tablet 0  . rosuvastatin (CRESTOR) 5 MG tablet Take 5 mg by mouth daily.   3   No facility-administered medications prior to visit.    ROS Review of Systems  Constitutional: Negative.  Negative for diaphoresis, fatigue and unexpected weight change.  HENT: Negative.   Eyes: Negative for visual disturbance.  Respiratory: Negative for cough, chest tightness, shortness of breath and wheezing.   Cardiovascular: Negative for chest pain, palpitations and leg swelling.  Gastrointestinal: Negative for abdominal pain, constipation, diarrhea,  nausea and vomiting.  Endocrine: Negative.   Genitourinary: Negative.  Negative for difficulty urinating.  Musculoskeletal: Negative for arthralgias and myalgias.  Skin: Negative.  Negative for color change and pallor.  Neurological: Negative.  Negative for dizziness, weakness, numbness and headaches.  Hematological: Negative for adenopathy. Does not bruise/bleed easily.  Psychiatric/Behavioral: Negative.     Objective:  BP 138/88   Pulse 76   Temp 98.5 F (36.9 C) (Oral)   Resp 16   Ht 5' 4.5" (1.638 m)   Wt 154 lb (69.9 kg)   SpO2 98%   BMI 26.03 kg/m   BP Readings from Last 3 Encounters:  06/30/20 138/88  03/30/20 128/80  07/24/19 130/78    Wt Readings from Last 3 Encounters:  06/30/20 154 lb (69.9 kg)  03/30/20 155 lb (70.3 kg)  07/24/19 150 lb (68 kg)    Physical Exam Vitals reviewed.  Constitutional:      Appearance: Normal appearance.  HENT:     Nose: Nose normal.     Mouth/Throat:     Mouth: Mucous membranes are moist.  Eyes:     General: No scleral icterus.    Conjunctiva/sclera: Conjunctivae normal.  Cardiovascular:     Rate and Rhythm: Normal rate and regular rhythm.  Pulmonary:     Effort: Pulmonary effort is normal.     Breath sounds: No stridor. No wheezing, rhonchi or rales.  Abdominal:     General: Abdomen is flat. Bowel sounds are normal. There is no distension.  Palpations: Abdomen is soft. There is no hepatomegaly, splenomegaly or mass.  Musculoskeletal:        General: Normal range of motion.     Cervical back: Neck supple.     Right lower leg: No edema.     Left lower leg: No edema.  Lymphadenopathy:     Cervical: No cervical adenopathy.  Skin:    General: Skin is warm and dry.  Neurological:     General: No focal deficit present.     Mental Status: She is alert.     Lab Results  Component Value Date   WBC 5.5 03/30/2020   HGB 14.5 03/30/2020   HCT 43.5 03/30/2020   PLT 267 03/30/2020   GLUCOSE 95 06/30/2020   CHOL 171  03/30/2020   TRIG 167 (H) 03/30/2020   HDL 55 03/30/2020   LDLDIRECT 100.0 05/01/2018   LDLCALC 89 03/30/2020   ALT 22 03/30/2020   AST 17 03/30/2020   NA 140 06/30/2020   K 3.9 06/30/2020   CL 102 06/30/2020   CREATININE 0.80 06/30/2020   BUN 17 06/30/2020   CO2 28 06/30/2020   TSH 1.06 03/30/2020   HGBA1C 5.9 07/24/2018    MM 3D SCREEN BREAST BILATERAL  Result Date: 07/11/2019 CLINICAL DATA:  Screening. EXAM: DIGITAL SCREENING BILATERAL MAMMOGRAM WITH TOMO AND CAD COMPARISON:  Previous exam(s). ACR Breast Density Category c: The breast tissue is heterogeneously dense, which may obscure small masses. FINDINGS: There are no findings suspicious for malignancy. Images were processed with CAD. IMPRESSION: No mammographic evidence of malignancy. A result letter of this screening mammogram will be mailed directly to the patient. RECOMMENDATION: Screening mammogram in one year. (Code:SM-B-01Y) BI-RADS CATEGORY  1: Negative. Electronically Signed   By: Elberta Fortis M.D.   On: 07/11/2019 10:40    Assessment & Plan:   Kimberly Cox was seen today for hypertension.  Diagnoses and all orders for this visit:  Essential hypertension- Her blood pressure is well controlled.  Electrolytes and renal function are normal.  Will continue the current combination of an ARB and thiazide diuretic. -     Basic metabolic panel; Future -     Magnesium; Future -     Magnesium -     Basic metabolic panel  Diuretic-induced hypokalemia- Her potassium level is normal now.  Will continue the current potassium supplementation. -     Basic metabolic panel; Future -     Magnesium; Future -     Magnesium -     Basic metabolic panel  Flu vaccine need -     Flu Vaccine QUAD High Dose(Fluad)  Need for shingles vaccine -     Varicella-zoster vaccine IM (Shingrix)   I am having Kimberly Cox maintain her Acidophilus, Fish Oil, rosuvastatin, co-enzyme Q-10, clobetasol, irbesartan-hydrochlorothiazide, omeprazole,  Relafen DS, and potassium chloride SA.  No orders of the defined types were placed in this encounter.    Follow-up: Return in about 6 months (around 12/29/2020).  Kimberly Linger, MD

## 2020-06-30 NOTE — Patient Instructions (Signed)

## 2020-07-03 ENCOUNTER — Encounter: Payer: Self-pay | Admitting: Internal Medicine

## 2020-07-07 ENCOUNTER — Telehealth: Payer: Self-pay

## 2020-07-07 NOTE — Telephone Encounter (Signed)
LVM reminder to complete cologuard kit that she rec'd in the mail & to call office if questions.  Will also send myChart message.

## 2020-07-13 DIAGNOSIS — M5416 Radiculopathy, lumbar region: Secondary | ICD-10-CM | POA: Diagnosis not present

## 2020-07-13 DIAGNOSIS — M5116 Intervertebral disc disorders with radiculopathy, lumbar region: Secondary | ICD-10-CM | POA: Diagnosis not present

## 2020-08-02 ENCOUNTER — Encounter: Payer: Self-pay | Admitting: Internal Medicine

## 2020-08-03 ENCOUNTER — Encounter: Payer: Self-pay | Admitting: Internal Medicine

## 2020-08-06 ENCOUNTER — Other Ambulatory Visit: Payer: Self-pay

## 2020-08-06 ENCOUNTER — Ambulatory Visit
Admission: RE | Admit: 2020-08-06 | Discharge: 2020-08-06 | Disposition: A | Discharge status: Home / Self Care | Payer: BC Managed Care – PPO | Source: Ambulatory Visit | Attending: Obstetrics & Gynecology | Admitting: Obstetrics & Gynecology

## 2020-08-06 DIAGNOSIS — Z1231 Encounter for screening mammogram for malignant neoplasm of breast: Secondary | ICD-10-CM

## 2020-08-12 ENCOUNTER — Ambulatory Visit (INDEPENDENT_AMBULATORY_CARE_PROVIDER_SITE_OTHER): Payer: BC Managed Care – PPO | Admitting: Obstetrics & Gynecology

## 2020-08-12 ENCOUNTER — Other Ambulatory Visit: Payer: Self-pay

## 2020-08-12 ENCOUNTER — Encounter: Payer: Self-pay | Admitting: Obstetrics & Gynecology

## 2020-08-12 VITALS — BP 134/80 | Ht 64.0 in | Wt 152.0 lb

## 2020-08-12 DIAGNOSIS — Z780 Asymptomatic menopausal state: Secondary | ICD-10-CM | POA: Diagnosis not present

## 2020-08-12 DIAGNOSIS — Z01419 Encounter for gynecological examination (general) (routine) without abnormal findings: Secondary | ICD-10-CM | POA: Diagnosis not present

## 2020-08-12 NOTE — Progress Notes (Signed)
Kimberly Cox 1945-10-24 [id number]   History:    74 y.o.  G2P2L2 Married  JA:[id number] presenting for annual gyn exam   IOX:[id number], well on no HRT. No PMB. No pelvic pain. Urine/BMs wnl. Breasts wnl. BMI 26.09. Physically active, but limited by lower back pain with numbing in legs, received a CS injection recently.[facility] with Fam MD.  Will do ColoGard now.  Past medical history,surgical history, family history and social history were all reviewed and documented in the EPIC chart.  Gynecologic History No LMP recorded. Patient is postmenopausal.  Obstetric History OB History  Gravida Para Term Preterm AB Living  2 2     0 2  SAB TAB Ectopic Multiple Live Births  0   0        # Outcome Date GA Lbr Len/2nd Weight Sex Delivery Anes PTL Lv  2 Para           1 Para              ROS: A ROS was performed and pertinent positives and negatives are included in the history.  GENERAL: No fevers or chills. HEENT: No change in vision, no earache, sore throat or sinus congestion. NECK: No pain or stiffness. CARDIOVASCULAR: No chest pain or pressure. No palpitations. PULMONARY: No shortness of breath, cough or wheeze. GASTROINTESTINAL: No abdominal pain, nausea, vomiting or diarrhea, melena or bright red blood per rectum. GENITOURINARY: No urinary frequency, urgency, hesitancy or dysuria. MUSCULOSKELETAL: No joint or muscle pain, no back pain, no recent trauma. DERMATOLOGIC: No rash, no itching, no lesions. ENDOCRINE: No polyuria, polydipsia, no heat or cold intolerance. No recent change in weight. HEMATOLOGICAL: No anemia or easy bruising or bleeding. NEUROLOGIC: No headache, seizures, numbness, tingling or weakness. PSYCHIATRIC: No depression, no loss of interest in normal activity or change in sleep pattern.     Exam:   BP 134/80 (BP Location: Right Arm, Patient Position: Sitting, Cuff Size: Normal)   Ht 5\' 4"  (1.626 m)   Wt 152 lb (68.9 kg)   BMI 26.09  kg/m   Body mass index is 26.09 kg/m.  General appearance : Well developed well nourished female. No acute distress HEENT: Eyes: no retinal hemorrhage or exudates,  Neck supple, trachea midline, no carotid bruits, no thyroidmegaly Lungs: Clear to auscultation, no rhonchi or wheezes, or rib retractions  Heart: Regular rate and rhythm, no murmurs or gallops Breast:Examined in sitting and supine position were symmetrical in appearance, no palpable masses or tenderness,  no skin retraction, no nipple inversion, no nipple discharge, no skin discoloration, no axillary or supraclavicular lymphadenopathy Abdomen: no palpable masses or tenderness, no rebound or guarding Extremities: no edema or skin discoloration or tenderness  Pelvic: Vulva: Normal             Vagina: No gross lesions or discharge  Cervix: No gross lesions or discharge  Uterus  AV, normal size, shape and consistency, non-tender and mobile  Adnexa  Without masses or tenderness  Anus: Normal   Assessment/Plan:  74 y.o. female for annual exam   1. Well female exam with routine gynecological exam Normal gynecologic exam in postmenopausal.  No indication for Pap test this year.  Breast exam normal.  Screening mammogram to schedule now.  Patient will do Cologuard this year.  Good body mass index at 26.09.  Continue with fitness and healthy nutrition.  Health labs with family physician.  2. Postmenopause Well on no hormone replacement therapy.  No postmenopausal bleeding.  Per patient, last bone density was normal.  Vitamin D supplements, nutritional calcium intake of 1200 mg daily and regular weightbearing physical activity is recommended.  [name] MD, 10:50 AM 08/12/2020

## 2020-08-13 ENCOUNTER — Encounter: Payer: Self-pay | Admitting: Obstetrics & Gynecology

## 2020-09-16 DIAGNOSIS — Z1211 Encounter for screening for malignant neoplasm of colon: Secondary | ICD-10-CM | POA: Diagnosis not present

## 2020-09-16 DIAGNOSIS — Z1212 Encounter for screening for malignant neoplasm of rectum: Secondary | ICD-10-CM | POA: Diagnosis not present

## 2020-09-16 LAB — COLOGUARD: Cologuard: NEGATIVE

## 2020-09-22 ENCOUNTER — Ambulatory Visit: Payer: BC Managed Care – PPO

## 2020-09-23 DIAGNOSIS — H5213 Myopia, bilateral: Secondary | ICD-10-CM | POA: Diagnosis not present

## 2020-09-23 DIAGNOSIS — H2513 Age-related nuclear cataract, bilateral: Secondary | ICD-10-CM | POA: Diagnosis not present

## 2020-09-24 ENCOUNTER — Ambulatory Visit: Payer: BC Managed Care – PPO

## 2020-09-25 LAB — COLOGUARD
COLOGUARD: NEGATIVE
Cologuard: NEGATIVE

## 2020-09-25 LAB — EXTERNAL GENERIC LAB PROCEDURE: COLOGUARD: NEGATIVE

## 2020-11-25 ENCOUNTER — Ambulatory Visit
Admission: RE | Admit: 2020-11-25 | Discharge: 2020-11-25 | Disposition: A | Discharge status: Home / Self Care | Payer: BC Managed Care – PPO | Source: Ambulatory Visit | Attending: Obstetrics & Gynecology | Admitting: Obstetrics & Gynecology

## 2020-11-25 ENCOUNTER — Other Ambulatory Visit: Payer: Self-pay

## 2020-11-25 ENCOUNTER — Other Ambulatory Visit: Payer: Self-pay | Admitting: Internal Medicine

## 2020-11-25 DIAGNOSIS — Z1231 Encounter for screening mammogram for malignant neoplasm of breast: Secondary | ICD-10-CM | POA: Diagnosis not present

## 2020-11-25 DIAGNOSIS — K2100 Gastro-esophageal reflux disease with esophagitis, without bleeding: Secondary | ICD-10-CM

## 2020-12-15 DIAGNOSIS — D2272 Melanocytic nevi of left lower limb, including hip: Secondary | ICD-10-CM | POA: Diagnosis not present

## 2020-12-15 DIAGNOSIS — D1801 Hemangioma of skin and subcutaneous tissue: Secondary | ICD-10-CM | POA: Diagnosis not present

## 2020-12-15 DIAGNOSIS — D2271 Melanocytic nevi of right lower limb, including hip: Secondary | ICD-10-CM | POA: Diagnosis not present

## 2020-12-15 DIAGNOSIS — D225 Melanocytic nevi of trunk: Secondary | ICD-10-CM | POA: Diagnosis not present

## 2020-12-30 DIAGNOSIS — Z136 Encounter for screening for cardiovascular disorders: Secondary | ICD-10-CM | POA: Diagnosis not present

## 2020-12-30 DIAGNOSIS — Z0000 Encounter for general adult medical examination without abnormal findings: Secondary | ICD-10-CM | POA: Diagnosis not present

## 2020-12-30 DIAGNOSIS — Z13820 Encounter for screening for osteoporosis: Secondary | ICD-10-CM | POA: Diagnosis not present

## 2020-12-30 DIAGNOSIS — I10 Essential (primary) hypertension: Secondary | ICD-10-CM | POA: Diagnosis not present

## 2020-12-30 DIAGNOSIS — F419 Anxiety disorder, unspecified: Secondary | ICD-10-CM | POA: Diagnosis not present

## 2020-12-30 DIAGNOSIS — M9933 Osseous stenosis of neural canal of lumbar region: Secondary | ICD-10-CM | POA: Diagnosis not present

## 2020-12-30 DIAGNOSIS — F32A Depression, unspecified: Secondary | ICD-10-CM | POA: Diagnosis not present

## 2020-12-30 DIAGNOSIS — R7303 Prediabetes: Secondary | ICD-10-CM | POA: Diagnosis not present

## 2020-12-30 DIAGNOSIS — Z780 Asymptomatic menopausal state: Secondary | ICD-10-CM | POA: Diagnosis not present

## 2020-12-30 DIAGNOSIS — E7800 Pure hypercholesterolemia, unspecified: Secondary | ICD-10-CM | POA: Diagnosis not present

## 2021-01-05 DIAGNOSIS — M5116 Intervertebral disc disorders with radiculopathy, lumbar region: Secondary | ICD-10-CM | POA: Diagnosis not present

## 2021-01-05 DIAGNOSIS — M5416 Radiculopathy, lumbar region: Secondary | ICD-10-CM | POA: Diagnosis not present

## 2021-01-21 DIAGNOSIS — H43812 Vitreous degeneration, left eye: Secondary | ICD-10-CM | POA: Diagnosis not present

## 2021-01-21 DIAGNOSIS — H31002 Unspecified chorioretinal scars, left eye: Secondary | ICD-10-CM | POA: Diagnosis not present

## 2021-02-02 DIAGNOSIS — H43812 Vitreous degeneration, left eye: Secondary | ICD-10-CM | POA: Diagnosis not present

## 2021-02-18 DIAGNOSIS — M48062 Spinal stenosis, lumbar region with neurogenic claudication: Secondary | ICD-10-CM | POA: Diagnosis not present

## 2021-02-18 DIAGNOSIS — R200 Anesthesia of skin: Secondary | ICD-10-CM | POA: Diagnosis not present

## 2021-03-23 DIAGNOSIS — H35372 Puckering of macula, left eye: Secondary | ICD-10-CM | POA: Diagnosis not present

## 2021-03-23 DIAGNOSIS — H31092 Other chorioretinal scars, left eye: Secondary | ICD-10-CM | POA: Diagnosis not present

## 2021-03-23 DIAGNOSIS — H43813 Vitreous degeneration, bilateral: Secondary | ICD-10-CM | POA: Diagnosis not present

## 2021-03-23 DIAGNOSIS — H35361 Drusen (degenerative) of macula, right eye: Secondary | ICD-10-CM | POA: Diagnosis not present

## 2021-03-24 DIAGNOSIS — M48062 Spinal stenosis, lumbar region with neurogenic claudication: Secondary | ICD-10-CM | POA: Diagnosis not present

## 2021-03-24 DIAGNOSIS — Z6825 Body mass index (BMI) 25.0-25.9, adult: Secondary | ICD-10-CM | POA: Diagnosis not present

## 2021-03-24 DIAGNOSIS — I10 Essential (primary) hypertension: Secondary | ICD-10-CM | POA: Diagnosis not present

## 2021-03-31 DIAGNOSIS — M48062 Spinal stenosis, lumbar region with neurogenic claudication: Secondary | ICD-10-CM | POA: Diagnosis not present

## 2021-03-31 DIAGNOSIS — M545 Low back pain, unspecified: Secondary | ICD-10-CM | POA: Diagnosis not present

## 2021-04-13 DIAGNOSIS — Z6825 Body mass index (BMI) 25.0-25.9, adult: Secondary | ICD-10-CM | POA: Diagnosis not present

## 2021-04-13 DIAGNOSIS — M48062 Spinal stenosis, lumbar region with neurogenic claudication: Secondary | ICD-10-CM | POA: Diagnosis not present

## 2021-04-13 DIAGNOSIS — I10 Essential (primary) hypertension: Secondary | ICD-10-CM | POA: Diagnosis not present

## 2021-04-20 DIAGNOSIS — M5416 Radiculopathy, lumbar region: Secondary | ICD-10-CM | POA: Diagnosis not present

## 2021-04-20 DIAGNOSIS — M5116 Intervertebral disc disorders with radiculopathy, lumbar region: Secondary | ICD-10-CM | POA: Diagnosis not present

## 2021-05-04 DIAGNOSIS — L565 Disseminated superficial actinic porokeratosis (DSAP): Secondary | ICD-10-CM | POA: Diagnosis not present

## 2021-05-06 DIAGNOSIS — M48062 Spinal stenosis, lumbar region with neurogenic claudication: Secondary | ICD-10-CM | POA: Diagnosis not present

## 2021-07-14 DIAGNOSIS — C44529 Squamous cell carcinoma of skin of other part of trunk: Secondary | ICD-10-CM | POA: Diagnosis not present

## 2021-07-23 DIAGNOSIS — L03114 Cellulitis of left upper limb: Secondary | ICD-10-CM | POA: Diagnosis not present

## 2021-07-23 DIAGNOSIS — S61209A Unspecified open wound of unspecified finger without damage to nail, initial encounter: Secondary | ICD-10-CM | POA: Diagnosis not present

## 2021-08-09 DIAGNOSIS — C44529 Squamous cell carcinoma of skin of other part of trunk: Secondary | ICD-10-CM | POA: Diagnosis not present

## 2021-09-21 DIAGNOSIS — M5116 Intervertebral disc disorders with radiculopathy, lumbar region: Secondary | ICD-10-CM | POA: Diagnosis not present

## 2021-09-21 DIAGNOSIS — M5416 Radiculopathy, lumbar region: Secondary | ICD-10-CM | POA: Diagnosis not present

## 2021-09-28 ENCOUNTER — Other Ambulatory Visit (HOSPITAL_COMMUNITY)
Admission: RE | Admit: 2021-09-28 | Discharge: 2021-09-28 | Disposition: A | Discharge status: Home / Self Care | Payer: BC Managed Care – PPO | Source: Ambulatory Visit | Attending: Obstetrics & Gynecology | Admitting: Obstetrics & Gynecology

## 2021-09-28 ENCOUNTER — Ambulatory Visit (INDEPENDENT_AMBULATORY_CARE_PROVIDER_SITE_OTHER): Payer: BC Managed Care – PPO | Admitting: Obstetrics & Gynecology

## 2021-09-28 ENCOUNTER — Other Ambulatory Visit: Payer: Self-pay

## 2021-09-28 ENCOUNTER — Encounter: Payer: Self-pay | Admitting: Obstetrics & Gynecology

## 2021-09-28 VITALS — BP 118/64 | HR 84 | Ht 64.25 in | Wt 149.0 lb

## 2021-09-28 DIAGNOSIS — Z780 Asymptomatic menopausal state: Secondary | ICD-10-CM | POA: Diagnosis not present

## 2021-09-28 DIAGNOSIS — Z01419 Encounter for gynecological examination (general) (routine) without abnormal findings: Secondary | ICD-10-CM | POA: Insufficient documentation

## 2021-09-28 DIAGNOSIS — Z9189 Other specified personal risk factors, not elsewhere classified: Secondary | ICD-10-CM

## 2021-09-28 NOTE — Progress Notes (Signed)
Kimberly Cox 19-Aug-1946 [id number]   History:    76 y.o. G2P2L2 Married   RP:  Established patient presenting for annual gyn exam    HPI: Postmenopause, well on no HRT.  No PMB.  No pelvic pain. Sexually active.  Pap 04/2018 Neg.  Pap reflex today.  Urine/BMs wnl.  Breasts wnl.  Mammo 11/2020 Neg.  BMI 25.38.  Not regularly physically active, limited by Spinal Stenosis, received CS injections regularly x last year. Health Labs with Fam MD. BD Normal in 2018.  Cologuard neg 2022   Past medical history,surgical history, family history and social history were all reviewed and documented in the EPIC chart.  Gynecologic History No LMP recorded. Patient is postmenopausal.  Obstetric History OB History  Gravida Para Term Preterm AB Living  2 2     0 2  SAB IAB Ectopic Multiple Live Births  0   0        # Outcome Date GA Lbr Len/2nd Weight Sex Delivery Anes PTL Lv  2 Para           1 Para              ROS: A ROS was performed and pertinent positives and negatives are included in the history.  GENERAL: No fevers or chills. HEENT: No change in vision, no earache, sore throat or sinus congestion. NECK: No pain or stiffness. CARDIOVASCULAR: No chest pain or pressure. No palpitations. PULMONARY: No shortness of breath, cough or wheeze. GASTROINTESTINAL: No abdominal pain, nausea, vomiting or diarrhea, melena or bright red blood per rectum. GENITOURINARY: No urinary frequency, urgency, hesitancy or dysuria. MUSCULOSKELETAL: No joint or muscle pain, no back pain, no recent trauma. DERMATOLOGIC: No rash, no itching, no lesions. ENDOCRINE: No polyuria, polydipsia, no heat or cold intolerance. No recent change in weight. HEMATOLOGICAL: No anemia or easy bruising or bleeding. NEUROLOGIC: No headache, seizures, numbness, tingling or weakness. PSYCHIATRIC: No depression, no loss of interest in normal activity or change in sleep pattern.     Exam:   BP 118/64    Pulse 84    Ht 5' 4.25" (1.632 m)     Wt 149 lb (67.6 kg)    BMI 25.38 kg/m   Body mass index is 25.38 kg/m.  General appearance : Well developed well nourished female. No acute distress HEENT: Eyes: no retinal hemorrhage or exudates,  Neck supple, trachea midline, no carotid bruits, no thyroidmegaly Lungs: Clear to auscultation, no rhonchi or wheezes, or rib retractions  Heart: Regular rate and rhythm, no murmurs or gallops Breast:Examined in sitting and supine position were symmetrical in appearance, no palpable masses or tenderness,  no skin retraction, no nipple inversion, no nipple discharge, no skin discoloration, no axillary or supraclavicular lymphadenopathy Abdomen: no palpable masses or tenderness, no rebound or guarding Extremities: no edema or skin discoloration or tenderness  Pelvic: Vulva: Normal             Vagina: No gross lesions or discharge  Cervix: No gross lesions or discharge.  Pap reflex done.  Uterus  AV, normal size, shape and consistency, non-tender and mobile  Adnexa  Without masses or tenderness  Anus: Normal   Assessment/Plan:  77 y.o. female for annual exam   1. Encounter for routine gynecological examination with Papanicolaou smear of cervix Postmenopause, well on no HRT.  No PMB.  No pelvic pain. Sexually active.  Pap 04/2018 Neg.  Pap reflex today.  Urine/BMs wnl.  Breasts wnl.  Mammo  11/2020 Neg.  BMI 25.38.  Not regularly physically active, limited by Spinal Stenosis, received CS injections regularly x last year. Health Labs with Fam MD. BD Normal in 2018.  Cologuard neg 2022 - Cytology - PAP( [facility])  2. At risk for infection  3. Postmenopause Postmenopause, well on no HRT.  No PMB.  No pelvic pain. Sexually active. BD normal in 2018.  Other orders - gabapentin (NEURONTIN) 300 MG capsule; Take 300 mg by mouth at bedtime.   [name] MD, 2:46 PM 09/28/2021

## 2021-09-29 DIAGNOSIS — H04123 Dry eye syndrome of bilateral lacrimal glands: Secondary | ICD-10-CM | POA: Diagnosis not present

## 2021-09-29 DIAGNOSIS — H33302 Unspecified retinal break, left eye: Secondary | ICD-10-CM | POA: Diagnosis not present

## 2021-09-29 DIAGNOSIS — H5213 Myopia, bilateral: Secondary | ICD-10-CM | POA: Diagnosis not present

## 2021-09-29 DIAGNOSIS — H2513 Age-related nuclear cataract, bilateral: Secondary | ICD-10-CM | POA: Diagnosis not present

## 2021-09-29 LAB — CYTOLOGY - PAP: Diagnosis: NEGATIVE

## 2021-10-20 DIAGNOSIS — M25551 Pain in right hip: Secondary | ICD-10-CM | POA: Diagnosis not present

## 2021-10-20 DIAGNOSIS — G8929 Other chronic pain: Secondary | ICD-10-CM | POA: Diagnosis not present

## 2021-10-20 DIAGNOSIS — R030 Elevated blood-pressure reading, without diagnosis of hypertension: Secondary | ICD-10-CM | POA: Diagnosis not present

## 2021-11-17 DIAGNOSIS — M25551 Pain in right hip: Secondary | ICD-10-CM | POA: Diagnosis not present

## 2021-11-18 DIAGNOSIS — H2511 Age-related nuclear cataract, right eye: Secondary | ICD-10-CM | POA: Diagnosis not present

## 2021-11-18 DIAGNOSIS — H25811 Combined forms of age-related cataract, right eye: Secondary | ICD-10-CM | POA: Diagnosis not present

## 2021-12-02 DIAGNOSIS — M7061 Trochanteric bursitis, right hip: Secondary | ICD-10-CM | POA: Diagnosis not present

## 2021-12-07 DIAGNOSIS — M7061 Trochanteric bursitis, right hip: Secondary | ICD-10-CM | POA: Diagnosis not present

## 2021-12-09 DIAGNOSIS — M7061 Trochanteric bursitis, right hip: Secondary | ICD-10-CM | POA: Diagnosis not present

## 2021-12-21 DIAGNOSIS — M7061 Trochanteric bursitis, right hip: Secondary | ICD-10-CM | POA: Diagnosis not present

## 2021-12-23 DIAGNOSIS — M7061 Trochanteric bursitis, right hip: Secondary | ICD-10-CM | POA: Diagnosis not present

## 2021-12-29 DIAGNOSIS — M7061 Trochanteric bursitis, right hip: Secondary | ICD-10-CM | POA: Diagnosis not present

## 2021-12-31 DIAGNOSIS — M7061 Trochanteric bursitis, right hip: Secondary | ICD-10-CM | POA: Diagnosis not present

## 2022-01-04 DIAGNOSIS — M7061 Trochanteric bursitis, right hip: Secondary | ICD-10-CM | POA: Diagnosis not present

## 2022-01-05 DIAGNOSIS — Z79899 Other long term (current) drug therapy: Secondary | ICD-10-CM | POA: Diagnosis not present

## 2022-01-05 DIAGNOSIS — F32A Depression, unspecified: Secondary | ICD-10-CM | POA: Diagnosis not present

## 2022-01-05 DIAGNOSIS — I444 Left anterior fascicular block: Secondary | ICD-10-CM | POA: Diagnosis not present

## 2022-01-05 DIAGNOSIS — Z801 Family history of malignant neoplasm of trachea, bronchus and lung: Secondary | ICD-10-CM | POA: Diagnosis not present

## 2022-01-05 DIAGNOSIS — R682 Dry mouth, unspecified: Secondary | ICD-10-CM | POA: Diagnosis not present

## 2022-01-05 DIAGNOSIS — H04129 Dry eye syndrome of unspecified lacrimal gland: Secondary | ICD-10-CM | POA: Diagnosis not present

## 2022-01-05 DIAGNOSIS — Z136 Encounter for screening for cardiovascular disorders: Secondary | ICD-10-CM | POA: Diagnosis not present

## 2022-01-05 DIAGNOSIS — I10 Essential (primary) hypertension: Secondary | ICD-10-CM | POA: Diagnosis not present

## 2022-01-05 DIAGNOSIS — Z13820 Encounter for screening for osteoporosis: Secondary | ICD-10-CM | POA: Diagnosis not present

## 2022-01-05 DIAGNOSIS — E7800 Pure hypercholesterolemia, unspecified: Secondary | ICD-10-CM | POA: Diagnosis not present

## 2022-01-05 DIAGNOSIS — F419 Anxiety disorder, unspecified: Secondary | ICD-10-CM | POA: Diagnosis not present

## 2022-01-05 DIAGNOSIS — I7300 Raynaud's syndrome without gangrene: Secondary | ICD-10-CM | POA: Diagnosis not present

## 2022-01-05 DIAGNOSIS — M9933 Osseous stenosis of neural canal of lumbar region: Secondary | ICD-10-CM | POA: Diagnosis not present

## 2022-01-05 DIAGNOSIS — R7303 Prediabetes: Secondary | ICD-10-CM | POA: Diagnosis not present

## 2022-01-05 DIAGNOSIS — Z780 Asymptomatic menopausal state: Secondary | ICD-10-CM | POA: Diagnosis not present

## 2022-01-05 DIAGNOSIS — Z0000 Encounter for general adult medical examination without abnormal findings: Secondary | ICD-10-CM | POA: Diagnosis not present

## 2022-01-20 DIAGNOSIS — H25812 Combined forms of age-related cataract, left eye: Secondary | ICD-10-CM | POA: Diagnosis not present

## 2022-01-20 DIAGNOSIS — H2512 Age-related nuclear cataract, left eye: Secondary | ICD-10-CM | POA: Diagnosis not present

## 2022-03-01 DIAGNOSIS — M5116 Intervertebral disc disorders with radiculopathy, lumbar region: Secondary | ICD-10-CM | POA: Diagnosis not present

## 2022-03-01 DIAGNOSIS — M5416 Radiculopathy, lumbar region: Secondary | ICD-10-CM | POA: Diagnosis not present

## 2022-04-13 DIAGNOSIS — M7061 Trochanteric bursitis, right hip: Secondary | ICD-10-CM | POA: Diagnosis not present

## 2022-05-25 ENCOUNTER — Other Ambulatory Visit: Payer: Self-pay | Admitting: Obstetrics & Gynecology

## 2022-05-25 DIAGNOSIS — Z1231 Encounter for screening mammogram for malignant neoplasm of breast: Secondary | ICD-10-CM

## 2022-06-27 ENCOUNTER — Ambulatory Visit: Payer: BC Managed Care – PPO

## 2022-06-29 ENCOUNTER — Ambulatory Visit
Admission: RE | Admit: 2022-06-29 | Discharge: 2022-06-29 | Disposition: A | Discharge status: Home / Self Care | Payer: BC Managed Care – PPO | Source: Ambulatory Visit | Attending: Obstetrics & Gynecology | Admitting: Obstetrics & Gynecology

## 2022-06-29 DIAGNOSIS — Z1231 Encounter for screening mammogram for malignant neoplasm of breast: Secondary | ICD-10-CM | POA: Diagnosis not present

## 2022-07-19 DIAGNOSIS — M5116 Intervertebral disc disorders with radiculopathy, lumbar region: Secondary | ICD-10-CM | POA: Diagnosis not present

## 2022-07-19 DIAGNOSIS — M5416 Radiculopathy, lumbar region: Secondary | ICD-10-CM | POA: Diagnosis not present

## 2022-08-03 DIAGNOSIS — Z961 Presence of intraocular lens: Secondary | ICD-10-CM | POA: Diagnosis not present

## 2022-09-07 DIAGNOSIS — M25552 Pain in left hip: Secondary | ICD-10-CM | POA: Diagnosis not present

## 2022-09-07 DIAGNOSIS — M25551 Pain in right hip: Secondary | ICD-10-CM | POA: Diagnosis not present

## 2022-11-02 NOTE — Therapy (Unsigned)
OUTPATIENT PHYSICAL THERAPY LOWER EXTREMITY EVALUATION   Patient Name: Kimberly Cox MRN: [id number] DOB:November 16, 1945, 77 y.o., female Today's Date: 11/02/2022  END OF SESSION:   Past Medical History:  Diagnosis Date   Anxiety    Anxiety disorder    Concussion Dec 13th, 2011   fell, striking her head against a wall, developed dizziness - UC eval c/w concussion. Had follow-up and did ok.    History of chest pain    Hyperlipidemia    Hypertension    Plantar fasciitis    Spinal stenosis, lumbar    Past Surgical History:  Procedure Laterality Date   ARTHRODESIS METATARSALPHALANGEAL JOINT (MTPJ) Right 10/08/2015   Procedure: RIGHT HALLUX METATARSAL PHALANGEAL JOINT  ARTHRODESIS;  Surgeon: Wylene Simmer, MD;  Location: [place];  Service: Orthopedics;  Laterality: Right;   caesarean section     CESAREAN SECTION     HAMMERTOE RECONSTRUCTION WITH WEIL OSTEOTOMY Right 10/08/2015   Procedure: RIGHT SECOND METATARSAL WEIL HAMMERTOE CORRECTION;  Surgeon: Wylene Simmer, MD;  Location: [place];  Service: Orthopedics;  Laterality: Right;   TONSILLECTOMY     Patient Active Problem List   Diagnosis Date Noted   Diuretic-induced hypokalemia 03/31/2020   Sciatica of right side 05/01/2018   Piriformis syndrome of left side 11/27/2017   GERD with esophagitis 08/22/2017   Asthmatic bronchitis 08/22/2017   Prediabetes 02/02/2017   Pure hyperglyceridemia 02/02/2017   Degenerative cervical disc 01/26/2016   Degenerative lumbar disc 01/26/2016   Spinal stenosis of lumbar region with radiculopathy 01/20/2016   Routine general medical examination at a health care facility 07/18/2014   Hereditary and idiopathic peripheral neuropathy 01/31/2008   Hyperlipidemia LDL goal <130 12/05/2007   Essential hypertension 12/05/2007    PCP: none  REFERRING PROVIDER: Renette Butters, MD  REFERRING DIAG:  Diagnosis  M70.71 (ICD-10-CM) - Bursitis of right hip    THERAPY  DIAG:  No diagnosis found.  Rationale for Evaluation and Treatment: Rehabilitation  ONSET DATE: 10/24/22  SUBJECTIVE:   SUBJECTIVE STATEMENT: ***  PERTINENT HISTORY: *** PAIN:  Are you having pain? {OPRCPAIN:27236}  PRECAUTIONS: {Therapy precautions:24002}  WEIGHT BEARING RESTRICTIONS: {Yes ***/No:24003}  FALLS:  Has patient fallen in last 6 months? {fallsyesno:27318}  LIVING ENVIRONMENT: Lives with: {OPRC lives with:25569::"lives with their family"} Lives in: {Lives in:25570} Stairs: {opstairs:27293} Has following equipment at home: {Assistive devices:23999}  OCCUPATION: ***  PLOF: {PLOF:24004}  PATIENT GOALS: ***  NEXT MD VISIT: ***  OBJECTIVE:   DIAGNOSTIC FINDINGS: ***  PATIENT SURVEYS:  {rehab surveys:24030}  COGNITION: Overall cognitive status: {cognition:24006}     SENSATION: {sensation:27233}  EDEMA:  {edema:24020}  MUSCLE LENGTH: Hamstrings: Right *** deg; Left *** deg Thomas test: Right *** deg; Left *** deg  POSTURE: {posture:25561}  PALPATION: ***  LOWER EXTREMITY ROM:  {AROM/PROM:27142} ROM Right eval Left eval  Hip flexion    Hip extension    Hip abduction    Hip adduction    Hip internal rotation    Hip external rotation    Knee flexion    Knee extension    Ankle dorsiflexion    Ankle plantarflexion    Ankle inversion    Ankle eversion     (Blank rows = not tested)  LOWER EXTREMITY MMT:  MMT Right eval Left eval  Hip flexion    Hip extension    Hip abduction    Hip adduction    Hip internal rotation    Hip external rotation    Knee flexion  Knee extension    Ankle dorsiflexion    Ankle plantarflexion    Ankle inversion    Ankle eversion     (Blank rows = not tested)  LOWER EXTREMITY SPECIAL TESTS:  {LEspecialtests:26242}  FUNCTIONAL TESTS:  {Functional tests:24029}  GAIT: Distance walked: *** Assistive device utilized: {Assistive devices:23999} Level of assistance: {Levels of  assistance:24026} Comments: ***   TODAY'S TREATMENT:                                                                                                                              DATE: ***    PATIENT EDUCATION:  Education details: *** Person educated: {Person educated:25204} Education method: {Education Method:25205} Education comprehension: {Education Comprehension:25206}  HOME EXERCISE PROGRAM: ***  ASSESSMENT:  CLINICAL IMPRESSION: Patient is a *** y.o. *** who was seen today for physical therapy evaluation and treatment for ***.   OBJECTIVE IMPAIRMENTS: {opptimpairments:25111}.   ACTIVITY LIMITATIONS: {activitylimitations:27494}  PARTICIPATION LIMITATIONS: {participationrestrictions:25113}  PERSONAL FACTORS: {Personal factors:25162} are also affecting patient's functional outcome.   REHAB POTENTIAL: {rehabpotential:25112}  CLINICAL DECISION MAKING: {clinical decision making:25114}  EVALUATION COMPLEXITY: {Evaluation complexity:25115}   GOALS: Goals reviewed with patient? {yes/no:20286}  SHORT TERM GOALS: Target date: *** *** Baseline: Goal status: {GOALSTATUS:25110}  2.  *** Baseline:  Goal status: {GOALSTATUS:25110}  3.  *** Baseline:  Goal status: {GOALSTATUS:25110}  4.  *** Baseline:  Goal status: {GOALSTATUS:25110}  5.  *** Baseline:  Goal status: {GOALSTATUS:25110}  6.  *** Baseline:  Goal status: {GOALSTATUS:25110}  LONG TERM GOALS: Target date: ***  *** Baseline:  Goal status: {GOALSTATUS:25110}  2.  *** Baseline:  Goal status: {GOALSTATUS:25110}  3.  *** Baseline:  Goal status: {GOALSTATUS:25110}  4.  *** Baseline:  Goal status: {GOALSTATUS:25110}  5.  *** Baseline:  Goal status: {GOALSTATUS:25110}  6.  *** Baseline:  Goal status: {GOALSTATUS:25110}   PLAN:  PT FREQUENCY: {rehab frequency:25116}  PT DURATION: {rehab duration:25117}  PLANNED INTERVENTIONS: {rehab planned interventions:25118::"Therapeutic  exercises","Therapeutic activity","Neuromuscular re-education","Balance training","Gait training","Patient/Family education","Self Care","Joint mobilization"}  PLAN FOR NEXT SESSION: ***   Marcelina Morel, DPT 11/02/2022, 6:56 PM

## 2022-11-03 ENCOUNTER — Ambulatory Visit: Payer: BC Managed Care – PPO | Attending: Orthopedic Surgery | Admitting: Physical Therapy

## 2022-11-03 DIAGNOSIS — R278 Other lack of coordination: Secondary | ICD-10-CM | POA: Diagnosis not present

## 2022-11-03 DIAGNOSIS — R262 Difficulty in walking, not elsewhere classified: Secondary | ICD-10-CM | POA: Insufficient documentation

## 2022-11-03 DIAGNOSIS — M25551 Pain in right hip: Secondary | ICD-10-CM | POA: Insufficient documentation

## 2022-11-03 DIAGNOSIS — M6281 Muscle weakness (generalized): Secondary | ICD-10-CM | POA: Diagnosis not present

## 2022-11-08 ENCOUNTER — Encounter: Payer: Self-pay | Admitting: Physical Therapy

## 2022-11-08 ENCOUNTER — Ambulatory Visit: Payer: BC Managed Care – PPO | Attending: Orthopedic Surgery | Admitting: Physical Therapy

## 2022-11-08 DIAGNOSIS — R262 Difficulty in walking, not elsewhere classified: Secondary | ICD-10-CM | POA: Diagnosis not present

## 2022-11-08 DIAGNOSIS — M25551 Pain in right hip: Secondary | ICD-10-CM | POA: Insufficient documentation

## 2022-11-08 DIAGNOSIS — M6281 Muscle weakness (generalized): Secondary | ICD-10-CM | POA: Insufficient documentation

## 2022-11-08 DIAGNOSIS — R278 Other lack of coordination: Secondary | ICD-10-CM | POA: Diagnosis not present

## 2022-11-08 NOTE — Therapy (Signed)
OUTPATIENT PHYSICAL THERAPY LOWER EXTREMITY EVALUATION   Patient Name: BRENE SESSIONS MRN: [id number] DOB:21-Jun-1946, 77 y.o., female Today's Date: 11/08/2022  END OF SESSION:  PT End of Session - 11/08/22 1101     Visit Number 2    Date for PT Re-Evaluation 01/26/23    PT Start Time 1056    PT Stop Time 1140    PT Time Calculation (min) 44 min    Activity Tolerance Patient tolerated treatment well    Behavior During Therapy [facility] for tasks assessed/performed              Past Medical History:  Diagnosis Date   Anxiety    Anxiety disorder    Concussion Dec 13th, 2011   fell, striking her head against a wall, developed dizziness - UC eval c/w concussion. Had follow-up and did ok.    History of chest pain    Hyperlipidemia    Hypertension    Plantar fasciitis    Spinal stenosis, lumbar    Past Surgical History:  Procedure Laterality Date   ARTHRODESIS METATARSALPHALANGEAL JOINT (MTPJ) Right 10/08/2015   Procedure: RIGHT HALLUX METATARSAL PHALANGEAL JOINT  ARTHRODESIS;  Surgeon: Wylene Simmer, MD;  Location: [place];  Service: Orthopedics;  Laterality: Right;   caesarean section     CESAREAN SECTION     HAMMERTOE RECONSTRUCTION WITH WEIL OSTEOTOMY Right 10/08/2015   Procedure: RIGHT SECOND METATARSAL WEIL HAMMERTOE CORRECTION;  Surgeon: Wylene Simmer, MD;  Location: [place];  Service: Orthopedics;  Laterality: Right;   TONSILLECTOMY     Patient Active Problem List   Diagnosis Date Noted   Diuretic-induced hypokalemia 03/31/2020   Sciatica of right side 05/01/2018   Piriformis syndrome of left side 11/27/2017   GERD with esophagitis 08/22/2017   Asthmatic bronchitis 08/22/2017   Prediabetes 02/02/2017   Pure hyperglyceridemia 02/02/2017   Degenerative cervical disc 01/26/2016   Degenerative lumbar disc 01/26/2016   Spinal stenosis of lumbar region with radiculopathy 01/20/2016   Routine general medical examination at a health care  facility 07/18/2014   Hereditary and idiopathic peripheral neuropathy 01/31/2008   Hyperlipidemia LDL goal <130 12/05/2007   Essential hypertension 12/05/2007    PCP: none  REFERRING PROVIDER: Renette Butters, MD  REFERRING DIAG:  Diagnosis  M70.71 (ICD-10-CM) - Bursitis of right hip    THERAPY DIAG:  Pain in right hip  Muscle weakness (generalized)  Other lack of coordination  Difficulty in walking, not elsewhere classified  Rationale for Evaluation and Treatment: Rehabilitation  ONSET DATE: 10/24/22  SUBJECTIVE:   SUBJECTIVE STATEMENT: Patient reports  B ITB syndrome with Bursitis, R > L. She also has spinal stenosis at L4-L5, receives injections. Her last one was in Nov/Dec. She feels like the last injection did nothing for that. Her back pain radiates into her legs, also > R, than L.  PERTINENT HISTORY: Spinal stenosis at L3-L4. PAIN:  Are you having pain? Yes: NPRS scale: 5/10 Pain location: Bursitis pain in R lateral hip, stenosis pain radiates down the back of her leg to knee Pain description: Aching, sharp Aggravating factors: Walking-hurts every time she WB through the R LE Relieving factors: Taking duel Tylenol/Advil around the clock. When she tried to wean off, the pain was much worse  PRECAUTIONS: None  WEIGHT BEARING RESTRICTIONS: No  FALLS:  Has patient fallen in last 6 months? No  LIVING ENVIRONMENT: Lives with: lives with their family Lives in: House/apartment Stairs: Yes: Internal: Does not use steps;  on left going up and External: 3 steps; none Difficulty climbing steps up or down due to pain Has following equipment at home: None  OCCUPATION: N/A  PLOF: Independent Beginning to affect her sleep due to discomfort.  PATIENT GOALS: Decrease pain  NEXT MD VISIT: no F/U scheduled  OBJECTIVE:   DIAGNOSTIC FINDINGS:  MRI 06/26/19 IMPRESSION: 1. Advanced disc degeneration from L1-2 to L4-5 with scoliosis. 2. No new finding when compared  to 2019 MRI. 3. L4-5 advanced spinal stenosis. 4. L1-2 to L3-4 moderate spinal stenosis. 5. L5-S1 bilateral subarticular recess narrowing from disc bulge. Either S1 nerve root could be affected. 6. Foraminal narrowings described above. 7. Left nephrolithiasis  PATIENT SURVEYS:  FOTO 44.3  COGNITION: Overall cognitive status: Within functional limits for tasks assessed     SENSATION: Patient reports B foot numbness and tingling, R> L  EDEMA:  N/A  MUSCLE LENGTH: Hamstrings: Right 65 deg; Left 70 deg Thomas test: B hip flexor tightness.  POSTURE: rounded shoulders, forward head, increased thoracic kyphosis, left pelvic obliquity, weight shift left, and Patient reports scoliosis  Stands high arches, R foot rotated in slightly more than L. R heel in eversion and tight- she reports H/O surgery to R great toe and second toe in the past.  PALPATION: TTP over R lateral hip, ITB tight B, but not painful  LOWER EXTREMITY ROM:  Passive ROM Right eval Left eval  Hip flexion Mildly tight Mildly tight  Hip extension Mildly tight Mildly tight  Hip abduction    Hip adduction    Hip internal rotation Mildly tight Mildly tight  Hip external rotation Mildly tight Mildly tight  Knee flexion    Knee extension    Ankle dorsiflexion Mildly tight   Ankle plantarflexion    Ankle inversion WFL   Ankle eversion Mildly tight    (Blank rows = not tested)  LOWER EXTREMITY MMT:  MMT Right eval Left eval  Hip flexion 3+ 4  Hip extension 3+ 4-  Hip abduction 3+ 4  Hip adduction    Hip internal rotation    Hip external rotation    Knee flexion 5 5  Knee extension 5 5  Ankle dorsiflexion 5 5  Ankle plantarflexion    Ankle inversion    Ankle eversion     (Blank rows = not tested)  LOWER EXTREMITY SPECIAL TESTS:  Hip special tests: Ober's test: negative SLR test (-)  FUNCTIONAL TESGAIT: Distance walked: In clinic distances Assistive device utilized: None Level of assistance:  Complete Independence Comments: Increased lateral sway, antalgic pattern   TODAY'S TREATMENT:                                                                                                                              DATE:  11/08/22 NuStep L3 x 5 minutes, U and LE Supine stretching-figure 4, glut stretch, B Stretching with strap, HS, ABD, ADD, B Supine strengthening, clamshells, Bridging with ABD against G  tband x 10 Sit to stand x 10, Vc for form and to slow down. CP to R hip for anti inflammatory properties  11/03/22 Education    PATIENT EDUCATION:  Education details: POC Person educated: Patient Education method: Explanation Education comprehension: verbalized understanding  HOME EXERCISE PROGRAM:  [id number]  ASSESSMENT:  CLINICAL IMPRESSION: Patient reports no new issues. Initiated an HEP with stretching and some strengthening to build stability in hips and core. She tolerated all exercises with no C/O increased pain.  OBJECTIVE IMPAIRMENTS: Abnormal gait, decreased activity tolerance, decreased balance, decreased coordination, decreased endurance, decreased mobility, difficulty walking, decreased ROM, decreased strength, impaired flexibility, postural dysfunction, and pain.   ACTIVITY LIMITATIONS: carrying, lifting, bending, standing, squatting, sleeping, stairs, and locomotion level  PARTICIPATION LIMITATIONS: meal prep, cleaning, laundry, and medication management  PERSONAL FACTORS: Age and Past/current experiences are also affecting patient's functional outcome.   REHAB POTENTIAL: Good  CLINICAL DECISION MAKING: Evolving/moderate complexity  EVALUATION COMPLEXITY: Moderate   GOALS: Goals reviewed with patient? Yes  SHORT TERM GOALS: Target date: 11/17/22 I with initial HEP Baseline: Goal status: 11/08/22 ongoing  LONG TERM GOALS: Target date: 01/26/23  I with final HEP Baseline:  Goal status: INITIAL  2.  Increase FOTO score to at least 57 Baseline:  44 Goal status: INITIAL  3.  Patient will be able to manage all steps step over pattern with LRAD, pain < 3/10 Baseline: Step to Goal status: INITIAL  4.  Patient will walk at least 500' with LRAD, with pain in R hip < 3/10 Baseline:  Goal status: INITIAL  5.  Patient will verbalize all precautions for spinal stenosis and hip Bursitis in order better manage the long term effects of both. Baseline:  Goal status: INITIAL  PLAN:  PT FREQUENCY: 1-2x/week  PT DURATION: 12 weeks  PLANNED INTERVENTIONS: Therapeutic exercises, Therapeutic activity, Neuromuscular re-education, Balance training, Gait training, Patient/Family education, Self Care, Joint mobilization, Stair training, Dry Needling, Electrical stimulation, Spinal mobilization, Cryotherapy, Moist heat, Taping, Ionotophoresis '4mg'$ /ml Dexamethasone, and Manual therapy  PLAN FOR NEXT SESSION: Assess tolerance to HEP, update as appropriate.   Marcelina Morel, DPT 11/08/2022, 12:13 PM

## 2022-11-13 IMAGING — MG MM DIGITAL SCREENING BILAT W/ TOMO AND CAD
6 of 10 series · 6 of 26 positions shown · non-contrast
Comparison: Previous exam(s).

CLINICAL DATA: Screening.

EXAM:
DIGITAL SCREENING BILATERAL MAMMOGRAM WITH TOMOSYNTHESIS AND CAD
TECHNIQUE: Bilateral screening digital craniocaudal and mediolateral oblique
mammograms were obtained. Bilateral screening digital breast
tomosynthesis was performed. The images were evaluated with
computer-aided detection.

[L XCCL]
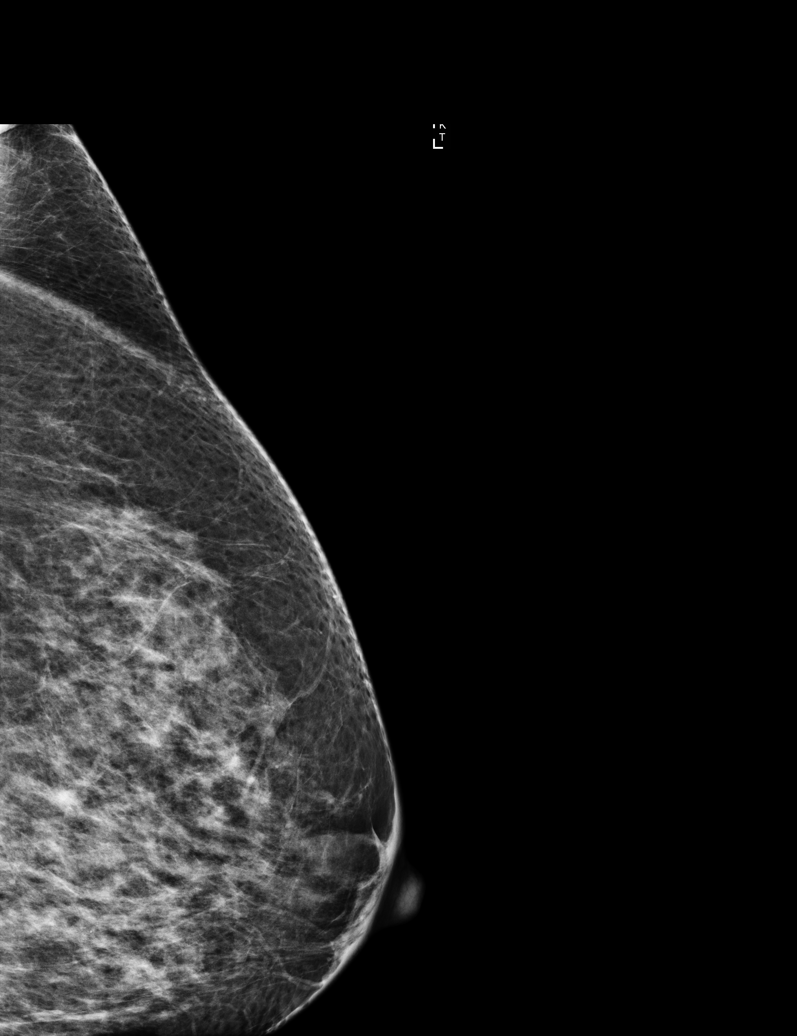

[R XCCL]
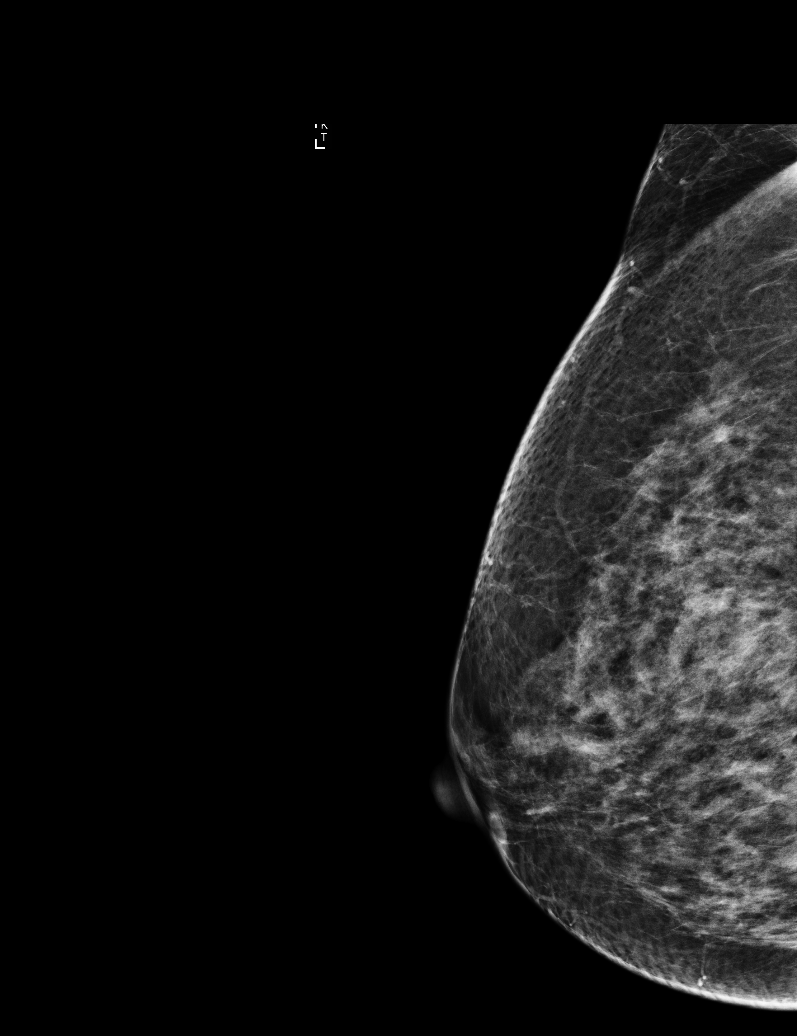

[R CC synth-2D]
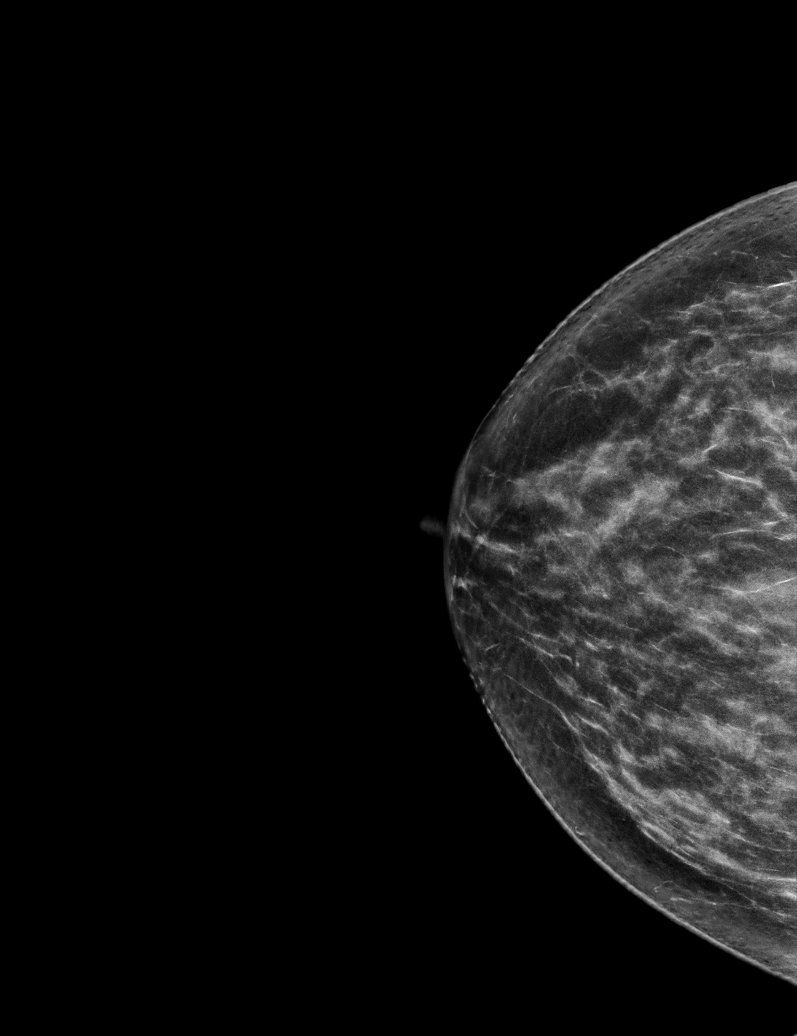

[R MLO synth-2D]
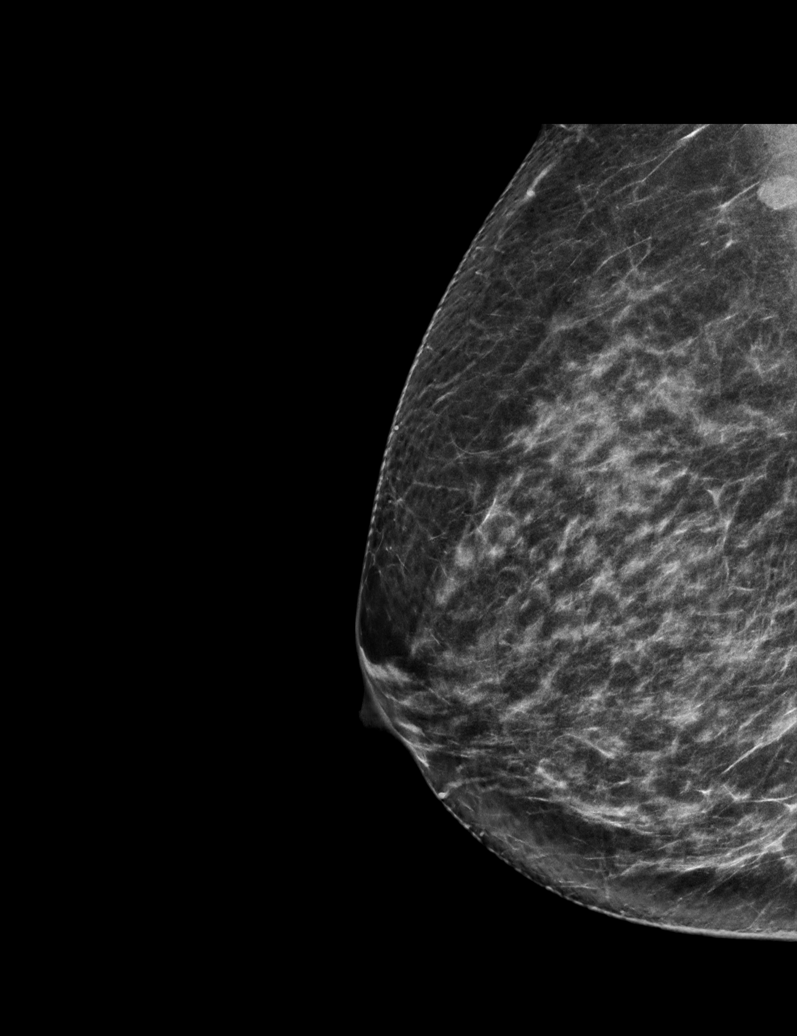

[L CC synth-2D]
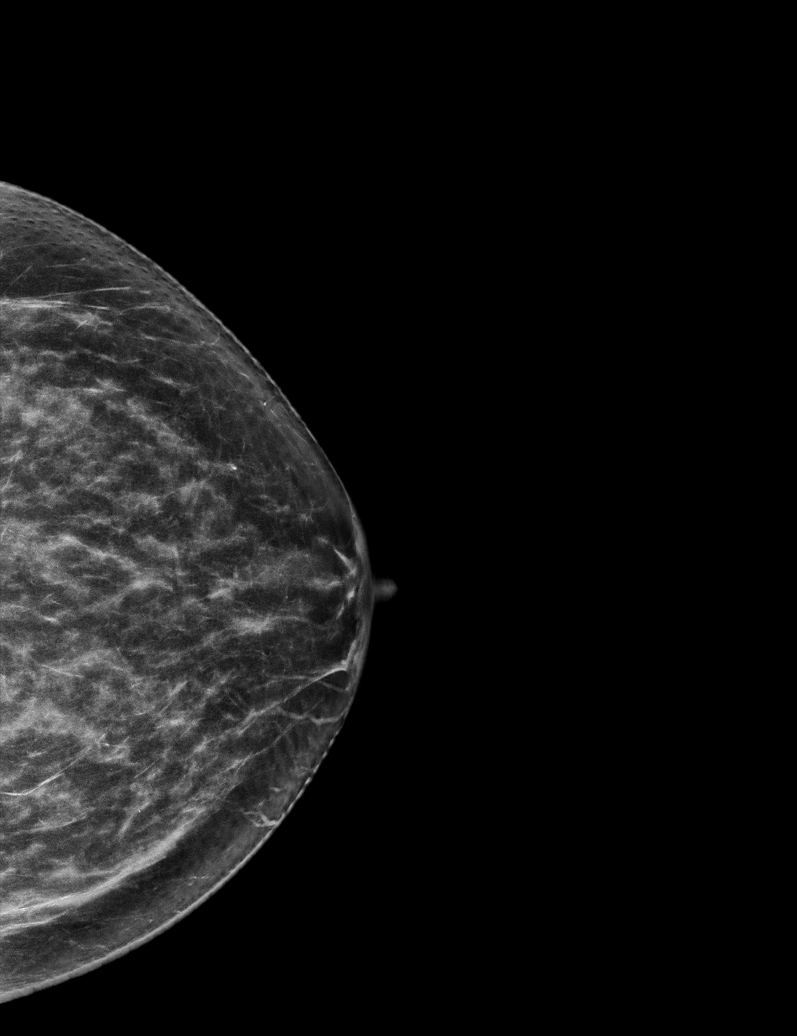

[L MLO synth-2D]
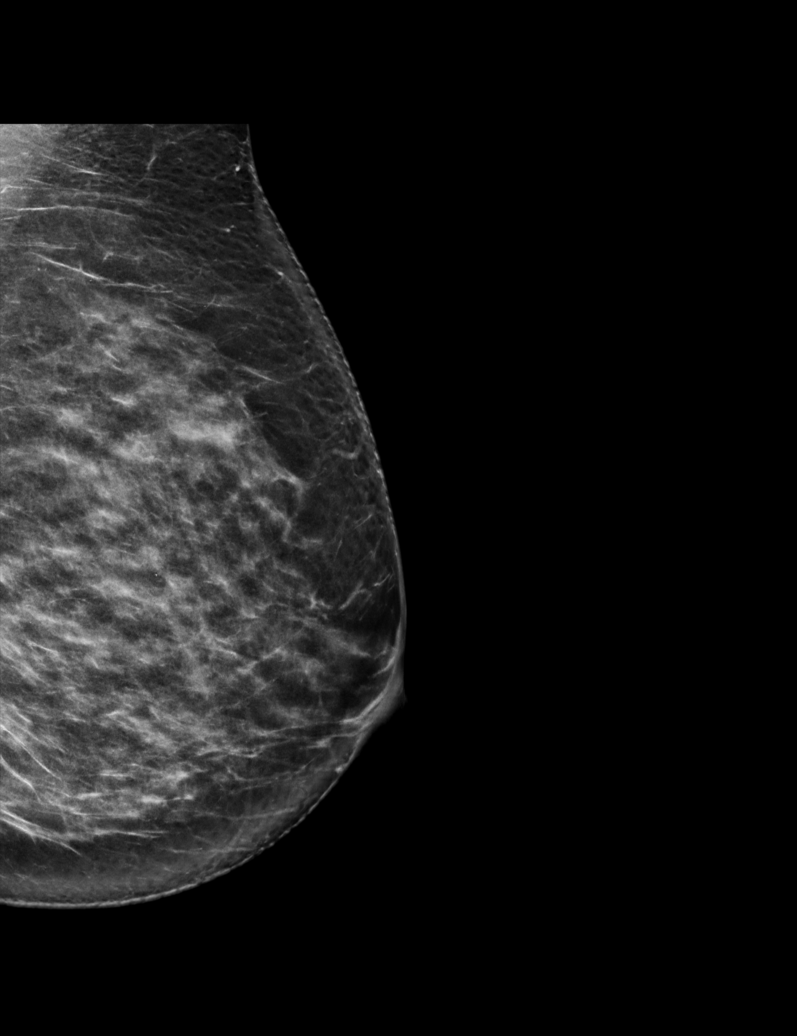

[6 of 26 positions shown; findings below may reference images not displayed]

ACR Breast Density Category c: The breast tissue is heterogeneously
dense, which may obscure small masses.
FINDINGS: There are no findings suspicious for malignancy. The images were
evaluated with computer-aided detection.
IMPRESSION: No mammographic evidence of malignancy. A result letter of this
screening mammogram will be mailed directly to the patient.

RECOMMENDATION:
Screening mammogram in one year. (Code:T4-5-GWO)

BI-RADS CATEGORY  1: Negative.

## 2022-11-15 ENCOUNTER — Ambulatory Visit: Payer: BC Managed Care – PPO | Admitting: Physical Therapy

## 2022-11-15 DIAGNOSIS — M6281 Muscle weakness (generalized): Secondary | ICD-10-CM

## 2022-11-15 DIAGNOSIS — R262 Difficulty in walking, not elsewhere classified: Secondary | ICD-10-CM | POA: Diagnosis not present

## 2022-11-15 DIAGNOSIS — M25551 Pain in right hip: Secondary | ICD-10-CM

## 2022-11-15 DIAGNOSIS — R278 Other lack of coordination: Secondary | ICD-10-CM | POA: Diagnosis not present

## 2022-11-15 NOTE — Therapy (Signed)
OUTPATIENT PHYSICAL THERAPY LOWER EXTREMITY    Patient Name: Kimberly Cox MRN: [id number] DOB:1946/03/22, 77 y.o., female Today's Date: 11/15/2022  END OF SESSION:  PT End of Session - 11/15/22 0801     Visit Number 3    Date for PT Re-Evaluation 01/26/23    PT Start Time 0800    PT Stop Time 0845    PT Time Calculation (min) 45 min              Past Medical History:  Diagnosis Date   Anxiety    Anxiety disorder    Concussion Dec 13th, 2011   fell, striking her head against a wall, developed dizziness - UC eval c/w concussion. Had follow-up and did ok.    History of chest pain    Hyperlipidemia    Hypertension    Plantar fasciitis    Spinal stenosis, lumbar    Past Surgical History:  Procedure Laterality Date   ARTHRODESIS METATARSALPHALANGEAL JOINT (MTPJ) Right 10/08/2015   Procedure: RIGHT HALLUX METATARSAL PHALANGEAL JOINT  ARTHRODESIS;  Surgeon: Wylene Simmer, MD;  Location: [facility];  Service: Orthopedics;  Laterality: Right;   caesarean section     CESAREAN SECTION     HAMMERTOE RECONSTRUCTION WITH WEIL OSTEOTOMY Right 10/08/2015   Procedure: RIGHT SECOND METATARSAL WEIL HAMMERTOE CORRECTION;  Surgeon: Wylene Simmer, MD;  Location: [facility];  Service: Orthopedics;  Laterality: Right;   TONSILLECTOMY     Patient Active Problem List   Diagnosis Date Noted   Diuretic-induced hypokalemia 03/31/2020   Sciatica of right side 05/01/2018   Piriformis syndrome of left side 11/27/2017   GERD with esophagitis 08/22/2017   Asthmatic bronchitis 08/22/2017   Prediabetes 02/02/2017   Pure hyperglyceridemia 02/02/2017   Degenerative cervical disc 01/26/2016   Degenerative lumbar disc 01/26/2016   Spinal stenosis of lumbar region with radiculopathy 01/20/2016   Routine general medical examination at a health care facility 07/18/2014   Hereditary and idiopathic peripheral neuropathy 01/31/2008   Hyperlipidemia LDL goal <130 12/05/2007    Essential hypertension 12/05/2007    PCP: none  REFERRING PROVIDER: Renette Butters, MD  REFERRING DIAG:  Diagnosis  M70.71 (ICD-10-CM) - Bursitis of right hip    THERAPY DIAG:  Pain in right hip  Muscle weakness (generalized)  Rationale for Evaluation and Treatment: Rehabilitation  ONSET DATE: 10/24/22  SUBJECTIVE:   SUBJECTIVE STATEMENT: Here for bursitis but some ex aggravate stenosis PERTINENT HISTORY: Spinal stenosis at L3-L4. PAIN:  Are you having pain? Yes: NPRS scale: 3/10 Pain location: Bursitis pain in R lateral hip, stenosis pain radiates down the back of her leg to knee Pain description: Aching, sharp Aggravating factors: Walking-hurts every time she WB through the R LE Relieving factors: Taking duel Tylenol/Advil around the clock. When she tried to wean off, the pain was much worse  PRECAUTIONS: None  WEIGHT BEARING RESTRICTIONS: No  FALLS:  Has patient fallen in last 6 months? No  LIVING ENVIRONMENT: Lives with: lives with their family Lives in: House/apartment Stairs: Yes: Internal: Does not use steps; on left going up and External: 3 steps; none Difficulty climbing steps up or down due to pain Has following equipment at home: None  OCCUPATION: N/A  PLOF: Independent Beginning to affect her sleep due to discomfort.  PATIENT GOALS: Decrease pain  NEXT MD VISIT: no F/U scheduled  OBJECTIVE:   DIAGNOSTIC FINDINGS:  MRI 06/26/19 IMPRESSION: 1. Advanced disc degeneration from L1-2 to L4-5 with scoliosis. 2. No  new finding when compared to 2019 MRI. 3. L4-5 advanced spinal stenosis. 4. L1-2 to L3-4 moderate spinal stenosis. 5. L5-S1 bilateral subarticular recess narrowing from disc bulge. Either S1 nerve root could be affected. 6. Foraminal narrowings described above. 7. Left nephrolithiasis  PATIENT SURVEYS:  FOTO 44.3  COGNITION: Overall cognitive status: Within functional limits for tasks assessed     SENSATION: Patient reports B  foot numbness and tingling, R> L  EDEMA:  N/A  MUSCLE LENGTH: Hamstrings: Right 65 deg; Left 70 deg Thomas test: B hip flexor tightness.  POSTURE: rounded shoulders, forward head, increased thoracic kyphosis, left pelvic obliquity, weight shift left, and Patient reports scoliosis  Stands high arches, R foot rotated in slightly more than L. R heel in eversion and tight- she reports H/O surgery to R great toe and second toe in the past.  PALPATION: TTP over R lateral hip, ITB tight B, but not painful  LOWER EXTREMITY ROM:  Passive ROM Right eval Left eval  Hip flexion Mildly tight Mildly tight  Hip extension Mildly tight Mildly tight  Hip abduction    Hip adduction    Hip internal rotation Mildly tight Mildly tight  Hip external rotation Mildly tight Mildly tight  Knee flexion    Knee extension    Ankle dorsiflexion Mildly tight   Ankle plantarflexion    Ankle inversion WFL   Ankle eversion Mildly tight    (Blank rows = not tested)  LOWER EXTREMITY MMT:  MMT Right eval Left eval  Hip flexion 3+ 4  Hip extension 3+ 4-  Hip abduction 3+ 4  Hip adduction    Hip internal rotation    Hip external rotation    Knee flexion 5 5  Knee extension 5 5  Ankle dorsiflexion 5 5  Ankle plantarflexion    Ankle inversion    Ankle eversion     (Blank rows = not tested)  LOWER EXTREMITY SPECIAL TESTS:  Hip special tests: Ober's test: negative SLR test (-)  FUNCTIONAL TESGAIT: Distance walked: In clinic distances Assistive device utilized: None Level of assistance: Complete Independence Comments: Increased lateral sway, antalgic pattern   TODAY'S TREATMENT:                                                                                                                              DATE:   11/15/22 Nustep L 5 5 min 6 inch step up fwd with opp leg ext 6 inch lateral step up with opp leg abd Leg press feet 3 way 10 each 30# Feet on ball bridge 10 x 3 sec hold, obls 20 x,  isometric abdominals 10 x hold 3 sec Clams  and hip flexion 2 sets 10 green tband Add ball squeeze with bridge 2 sets 10 SL hip abd and circles CW and CCW 10 each BIL PROM/strecth BIL hips Ionto RT hip 1.1 cc dex 4 hour leave on patch    11/08/22 NuStep L3 x  5 minutes, U and LE Supine stretching-figure 4, glut stretch, B Stretching with strap, HS, ABD, ADD, B Supine strengthening, clamshells, Bridging with ABD against G tband x 10 Sit to stand x 10, Vc for form and to slow down. CP to R hip for anti inflammatory properties  11/03/22 Education    PATIENT EDUCATION:  Education details: POC Person educated: Patient Education method: Explanation Education comprehension: verbalized understanding  HOME EXERCISE PROGRAM:  [id number]  ASSESSMENT:  CLINICAL IMPRESSION: STG met- as pt verb doing HEP, discussed to tolerance with stretching not overpulling as pt thinks she might be. Progressed ex and stretching. Added ionto to RT hip OBJECTIVE IMPAIRMENTS: Abnormal gait, decreased activity tolerance, decreased balance, decreased coordination, decreased endurance, decreased mobility, difficulty walking, decreased ROM, decreased strength, impaired flexibility, postural dysfunction, and pain.   ACTIVITY LIMITATIONS: carrying, lifting, bending, standing, squatting, sleeping, stairs, and locomotion level  PARTICIPATION LIMITATIONS: meal prep, cleaning, laundry, and medication management  PERSONAL FACTORS: Age and Past/current experiences are also affecting patient's functional outcome.   REHAB POTENTIAL: Good  CLINICAL DECISION MAKING: Evolving/moderate complexity  EVALUATION COMPLEXITY: Moderate   GOALS: Goals reviewed with patient? Yes  SHORT TERM GOALS: Target date: 11/17/22 I with initial HEP Baseline: Goal status: 11/08/22 ongoing  11/15/22 MET  LONG TERM GOALS: Target date: 01/26/23  I with final HEP Baseline:  Goal status: INITIAL  2.  Increase FOTO score to at least  57 Baseline: 44 Goal status: INITIAL  3.  Patient will be able to manage all steps step over pattern with LRAD, pain < 3/10 Baseline: Step to Goal status: INITIAL  4.  Patient will walk at least 500' with LRAD, with pain in R hip < 3/10 Baseline:  Goal status: INITIAL  5.  Patient will verbalize all precautions for spinal stenosis and hip Bursitis in order better manage the long term effects of both. Baseline:  Goal status: INITIAL  PLAN:  PT FREQUENCY: 1-2x/week  PT DURATION: 12 weeks  PLANNED INTERVENTIONS: Therapeutic exercises, Therapeutic activity, Neuromuscular re-education, Balance training, Gait training, Patient/Family education, Self Care, Joint mobilization, Stair training, Dry Needling, Electrical stimulation, Spinal mobilization, Cryotherapy, Moist heat, Taping, Ionotophoresis '4mg'$ /ml Dexamethasone, and Manual therapy  PLAN FOR NEXT SESSION: Assess tolerance to HEP, update as appropriate. [place]   Patient Details  Name: Kimberly Cox MRN: [id number] Date of Birth: 03-16-1946 Referring Provider:  No ref. provider found  Encounter Date: 11/15/2022   Laqueta Carina, PTA 11/15/2022, 8:02 AM  [facility]. [place], [place], [address] Phone: [phone]   Fax:  [phone]

## 2022-11-17 ENCOUNTER — Ambulatory Visit: Payer: BC Managed Care – PPO | Admitting: Physical Therapy

## 2022-11-17 ENCOUNTER — Encounter: Payer: Self-pay | Admitting: Physical Therapy

## 2022-11-17 DIAGNOSIS — M25551 Pain in right hip: Secondary | ICD-10-CM

## 2022-11-17 DIAGNOSIS — M6281 Muscle weakness (generalized): Secondary | ICD-10-CM | POA: Diagnosis not present

## 2022-11-17 DIAGNOSIS — R262 Difficulty in walking, not elsewhere classified: Secondary | ICD-10-CM

## 2022-11-17 DIAGNOSIS — R278 Other lack of coordination: Secondary | ICD-10-CM

## 2022-11-17 NOTE — Therapy (Addendum)
OUTPATIENT PHYSICAL THERAPY LOWER EXTREMITY    Patient Name: Kimberly Cox MRN: [id number] DOB:[birth year], 77 y.o., female Today's Date: 11/17/2022  END OF SESSION:  PT End of Session - 11/17/22 1403     Visit Number 4    Date for PT Re-Evaluation 01/26/23    PT Start Time 1359    PT Stop Time 1431    PT Time Calculation (min) 32 min    Activity Tolerance Patient tolerated treatment well    Behavior During Therapy [facility] for tasks assessed/performed              Past Medical History:  Diagnosis Date   Anxiety    Anxiety disorder    Concussion Dec 13th, 2011   fell, striking her head against a wall, developed dizziness - UC eval c/w concussion. Had follow-up and did ok.    History of chest pain    Hyperlipidemia    Hypertension    Plantar fasciitis    Spinal stenosis, lumbar    Past Surgical History:  Procedure Laterality Date   ARTHRODESIS METATARSALPHALANGEAL JOINT (MTPJ) Right 10/08/2015   Procedure: RIGHT HALLUX METATARSAL PHALANGEAL JOINT  ARTHRODESIS;  Surgeon: Wylene Simmer, MD;  Location: [facility];  Service: Orthopedics;  Laterality: Right;   caesarean section     CESAREAN SECTION     HAMMERTOE RECONSTRUCTION WITH WEIL OSTEOTOMY Right 10/08/2015   Procedure: RIGHT SECOND METATARSAL WEIL HAMMERTOE CORRECTION;  Surgeon: Wylene Simmer, MD;  Location: [facility];  Service: Orthopedics;  Laterality: Right;   TONSILLECTOMY     Patient Active Problem List   Diagnosis Date Noted   Diuretic-induced hypokalemia 03/31/2020   Sciatica of right side 05/01/2018   Piriformis syndrome of left side 11/27/2017   GERD with esophagitis 08/22/2017   Asthmatic bronchitis 08/22/2017   Prediabetes 02/02/2017   Pure hyperglyceridemia 02/02/2017   Degenerative cervical disc 01/26/2016   Degenerative lumbar disc 01/26/2016   Spinal stenosis of lumbar region with radiculopathy 01/20/2016   Routine general medical examination at a health care facility  07/18/2014   Hereditary and idiopathic peripheral neuropathy 01/31/2008   Hyperlipidemia LDL goal <130 12/05/2007   Essential hypertension 12/05/2007    PCP: none  REFERRING PROVIDER: Renette Butters, MD  REFERRING DIAG:  Diagnosis  M70.71 (ICD-10-CM) - Bursitis of right hip    THERAPY DIAG:  Pain in right hip  Muscle weakness (generalized)  Other lack of coordination  Difficulty in walking, not elsewhere classified  Rationale for Evaluation and Treatment: Rehabilitation  ONSET DATE: 10/24/22  SUBJECTIVE:   SUBJECTIVE STATEMENT: Patient reports that she is not sure if the Ionto patch helped, but willing to try it again as it sometimes takes more than one patch.  PERTINENT HISTORY: Spinal stenosis at L3-L4. PAIN:  Are you having pain? Yes: NPRS scale: 3/10 Pain location: Bursitis pain in R lateral hip, stenosis pain radiates down the back of her leg to knee Pain description: Aching, sharp Aggravating factors: Walking-hurts every time she WB through the R LE Relieving factors: Taking duel Tylenol/Advil around the clock. When she tried to wean off, the pain was much worse  PRECAUTIONS: None  WEIGHT BEARING RESTRICTIONS: No  FALLS:  Has patient fallen in last 6 months? No  LIVING ENVIRONMENT: Lives with: lives with their family Lives in: House/apartment Stairs: Yes: Internal: Does not use steps; on left going up and External: 3 steps; none Difficulty climbing steps up or down due to pain Has following equipment at  home: None  OCCUPATION: N/A  PLOF: Independent Beginning to affect her sleep due to discomfort.  PATIENT GOALS: Decrease pain  NEXT MD VISIT: no F/U scheduled  OBJECTIVE:   DIAGNOSTIC FINDINGS:  MRI 06/26/19 IMPRESSION: 1. Advanced disc degeneration from L1-2 to L4-5 with scoliosis. 2. No new finding when compared to 2019 MRI. 3. L4-5 advanced spinal stenosis. 4. L1-2 to L3-4 moderate spinal stenosis. 5. L5-S1 bilateral subarticular recess  narrowing from disc bulge. Either S1 nerve root could be affected. 6. Foraminal narrowings described above. 7. Left nephrolithiasis  PATIENT SURVEYS:  FOTO 44.3  COGNITION: Overall cognitive status: Within functional limits for tasks assessed     SENSATION: Patient reports B foot numbness and tingling, R> L  EDEMA:  N/A  MUSCLE LENGTH: Hamstrings: Right 65 deg; Left 70 deg Thomas test: B hip flexor tightness.  POSTURE: rounded shoulders, forward head, increased thoracic kyphosis, left pelvic obliquity, weight shift left, and Patient reports scoliosis  Stands high arches, R foot rotated in slightly more than L. R heel in eversion and tight- she reports H/O surgery to R great toe and second toe in the past.  PALPATION: TTP over R lateral hip, ITB tight B, but not painful  LOWER EXTREMITY ROM:  Passive ROM Right eval Left eval  Hip flexion Mildly tight Mildly tight  Hip extension Mildly tight Mildly tight  Hip abduction    Hip adduction    Hip internal rotation Mildly tight Mildly tight  Hip external rotation Mildly tight Mildly tight  Knee flexion    Knee extension    Ankle dorsiflexion Mildly tight   Ankle plantarflexion    Ankle inversion WFL   Ankle eversion Mildly tight    (Blank rows = not tested)  LOWER EXTREMITY MMT:  MMT Right eval Left eval  Hip flexion 3+ 4  Hip extension 3+ 4-  Hip abduction 3+ 4  Hip adduction    Hip internal rotation    Hip external rotation    Knee flexion 5 5  Knee extension 5 5  Ankle dorsiflexion 5 5  Ankle plantarflexion    Ankle inversion    Ankle eversion     (Blank rows = not tested)  LOWER EXTREMITY SPECIAL TESTS:  Hip special tests: Ober's test: negative SLR test (-)  FUNCTIONAL TESGAIT: Distance walked: In clinic distances Assistive device utilized: None Level of assistance: Complete Independence Comments: Increased lateral sway, antalgic pattern   TODAY'S TREATMENT:                                                                                                                               DATE:  11/17/22 NuStep L5 x 6 minutes. STM to R hip muscles including gluts, piriformis followed by stretch Hip abd stretch using strap Bridge with hip abd against G tband resistance  11/15/22 Nustep L 5 5 min 6 inch step up fwd with opp leg ext 6 inch lateral step up with  opp leg abd Leg press feet 3 way 10 each 30# Feet on ball bridge 10 x 3 sec hold, obls 20 x, isometric abdominals 10 x hold 3 sec Clams  and hip flexion 2 sets 10 green tband Add ball squeeze with bridge 2 sets 10 SL hip abd and circles CW and CCW 10 each BIL PROM/strecth BIL hips Ionto RT hip 1.1 cc dex 4 hour leave on patch    11/08/22 NuStep L3 x 5 minutes, U and LE Supine stretching-figure 4, glut stretch, B Stretching with strap, HS, ABD, ADD, B Supine strengthening, clamshells, Bridging with ABD against G tband x 10 Sit to stand x 10, Vc for form and to slow down. CP to R hip for anti inflammatory properties  11/03/22 Education    PATIENT EDUCATION:  Education details: POC Person educated: Patient Education method: Explanation Education comprehension: verbalized understanding  HOME EXERCISE PROGRAM:  [id number]  ASSESSMENT:  CLINICAL IMPRESSION: Patient reports some improvement, but overall stiff painful in her hip. OBJECTIVE IMPAIRMENTS: Abnormal gait, decreased activity tolerance, decreased balance, decreased coordination, decreased endurance, decreased mobility, difficulty walking, decreased ROM, decreased strength, impaired flexibility, postural dysfunction, and pain.   ACTIVITY LIMITATIONS: carrying, lifting, bending, standing, squatting, sleeping, stairs, and locomotion level  PARTICIPATION LIMITATIONS: meal prep, cleaning, laundry, and medication management  PERSONAL FACTORS: Age and Past/current experiences are also affecting patient's functional outcome.   REHAB POTENTIAL: Good  CLINICAL DECISION  MAKING: Evolving/moderate complexity  EVALUATION COMPLEXITY: Moderate   GOALS: Goals reviewed with patient? Yes  SHORT TERM GOALS: Target date: 11/17/22 I with initial HEP Baseline: Goal status: 11/08/22 ongoing  11/15/22 MET  LONG TERM GOALS: Target date: 01/26/23  I with final HEP Baseline:  Goal status: INITIAL  2.  Increase FOTO score to at least 57 Baseline: 44 Goal status: INITIAL  3.  Patient will be able to manage all steps step over pattern with LRAD, pain < 3/10 Baseline: Step to Goal status: INITIAL  4.  Patient will walk at least 500' with LRAD, with pain in R hip < 3/10 Baseline:  Goal status: INITIAL  5.  Patient will verbalize all precautions for spinal stenosis and hip Bursitis in order better manage the long term effects of both. Baseline:  Goal status: INITIAL  PLAN:  PT FREQUENCY: 1-2x/week  PT DURATION: 12 weeks  PLANNED INTERVENTIONS: Therapeutic exercises, Therapeutic activity, Neuromuscular re-education, Balance training, Gait training, Patient/Family education, Self Care, Joint mobilization, Stair training, Dry Needling, Electrical stimulation, Spinal mobilization, Cryotherapy, Moist heat, Taping, Ionotophoresis 4mg /ml Dexamethasone, and Manual therapy  PLAN FOR NEXT SESSION: Assess tolerance to HEP, update as appropriate. [place]   Patient Details  Name: KALAYA INFANTINO MRN: [id number] Date of Birth: [birth year] Referring Provider:  No ref. provider found  Encounter Date: 11/17/2022  Ethel Rana DPT 11/17/22 2:42 PM   [facility] [place]. [place], [place], [address] Phone: [phone]   Fax:  [phone]

## 2022-11-21 ENCOUNTER — Ambulatory Visit: Payer: BC Managed Care – PPO | Admitting: Physical Therapy

## 2022-11-21 ENCOUNTER — Encounter: Payer: Self-pay | Admitting: Physical Therapy

## 2022-11-21 DIAGNOSIS — R278 Other lack of coordination: Secondary | ICD-10-CM

## 2022-11-21 DIAGNOSIS — M6281 Muscle weakness (generalized): Secondary | ICD-10-CM | POA: Diagnosis not present

## 2022-11-21 DIAGNOSIS — R262 Difficulty in walking, not elsewhere classified: Secondary | ICD-10-CM | POA: Diagnosis not present

## 2022-11-21 DIAGNOSIS — M25551 Pain in right hip: Secondary | ICD-10-CM | POA: Diagnosis not present

## 2022-11-21 NOTE — Therapy (Signed)
OUTPATIENT PHYSICAL THERAPY LOWER EXTREMITY    Patient Name: Kimberly Cox MRN: [id number] DOB:[birth year], 77 y.o., female Today's Date: 11/21/2022  END OF SESSION:  PT End of Session - 11/21/22 1059     Visit Number 5    Date for PT Re-Evaluation 01/26/23    PT Start Time 1100    PT Stop Time 1145    PT Time Calculation (min) 45 min    Activity Tolerance Patient tolerated treatment well    Behavior During Therapy [facility] for tasks assessed/performed              Past Medical History:  Diagnosis Date   Anxiety    Anxiety disorder    Concussion Dec 13th, 2011   fell, striking her head against a wall, developed dizziness - UC eval c/w concussion. Had follow-up and did ok.    History of chest pain    Hyperlipidemia    Hypertension    Plantar fasciitis    Spinal stenosis, lumbar    Past Surgical History:  Procedure Laterality Date   ARTHRODESIS METATARSALPHALANGEAL JOINT (MTPJ) Right 10/08/2015   Procedure: RIGHT HALLUX METATARSAL PHALANGEAL JOINT  ARTHRODESIS;  Surgeon: Wylene Simmer, MD;  Location: [place];  Service: Orthopedics;  Laterality: Right;   caesarean section     CESAREAN SECTION     HAMMERTOE RECONSTRUCTION WITH WEIL OSTEOTOMY Right 10/08/2015   Procedure: RIGHT SECOND METATARSAL WEIL HAMMERTOE CORRECTION;  Surgeon: Wylene Simmer, MD;  Location: [place];  Service: Orthopedics;  Laterality: Right;   TONSILLECTOMY     Patient Active Problem List   Diagnosis Date Noted   Diuretic-induced hypokalemia 03/31/2020   Sciatica of right side 05/01/2018   Piriformis syndrome of left side 11/27/2017   GERD with esophagitis 08/22/2017   Asthmatic bronchitis 08/22/2017   Prediabetes 02/02/2017   Pure hyperglyceridemia 02/02/2017   Degenerative cervical disc 01/26/2016   Degenerative lumbar disc 01/26/2016   Spinal stenosis of lumbar region with radiculopathy 01/20/2016   Routine general medical examination at a health care facility  07/18/2014   Hereditary and idiopathic peripheral neuropathy 01/31/2008   Hyperlipidemia LDL goal <130 12/05/2007   Essential hypertension 12/05/2007    PCP: none  REFERRING PROVIDER: Renette Butters, MD  REFERRING DIAG:  Diagnosis  M70.71 (ICD-10-CM) - Bursitis of right hip    THERAPY DIAG:  Pain in right hip  Muscle weakness (generalized)  Other lack of coordination  Difficulty in walking, not elsewhere classified  Rationale for Evaluation and Treatment: Rehabilitation  ONSET DATE: 10/24/22  SUBJECTIVE:   SUBJECTIVE STATEMENT: Not much difference, will have an injection in the back this Thursday  PERTINENT HISTORY: Spinal stenosis at L3-L4. PAIN:  Are you having pain? Yes: NPRS scale: 3/10 Pain location: Bursitis pain in R lateral hip, stenosis pain radiates down the back of her leg to knee Pain description: Aching, sharp Aggravating factors: Walking-hurts every time she WB through the R LE Relieving factors: Taking duel Tylenol/Advil around the clock. When she tried to wean off, the pain was much worse  PRECAUTIONS: None  WEIGHT BEARING RESTRICTIONS: No  FALLS:  Has patient fallen in last 6 months? No  LIVING ENVIRONMENT: Lives with: lives with their family Lives in: House/apartment Stairs: Yes: Internal: Does not use steps; on left going up and External: 3 steps; none Difficulty climbing steps up or down due to pain Has following equipment at home: None  OCCUPATION: N/A  PLOF: Independent Beginning to affect her sleep due  to discomfort.  PATIENT GOALS: Decrease pain  NEXT MD VISIT: no F/U scheduled  OBJECTIVE:   DIAGNOSTIC FINDINGS:  MRI 06/26/19 IMPRESSION: 1. Advanced disc degeneration from L1-2 to L4-5 with scoliosis. 2. No new finding when compared to 2019 MRI. 3. L4-5 advanced spinal stenosis. 4. L1-2 to L3-4 moderate spinal stenosis. 5. L5-S1 bilateral subarticular recess narrowing from disc bulge. Either S1 nerve root could be  affected. 6. Foraminal narrowings described above. 7. Left nephrolithiasis  PATIENT SURVEYS:  FOTO 44.3  COGNITION: Overall cognitive status: Within functional limits for tasks assessed     SENSATION: Patient reports B foot numbness and tingling, R> L  EDEMA:  N/A  MUSCLE LENGTH: Hamstrings: Right 65 deg; Left 70 deg Thomas test: B hip flexor tightness.  POSTURE: rounded shoulders, forward head, increased thoracic kyphosis, left pelvic obliquity, weight shift left, and Patient reports scoliosis  Stands high arches, R foot rotated in slightly more than L. R heel in eversion and tight- she reports H/O surgery to R great toe and second toe in the past.  PALPATION: TTP over R lateral hip, ITB tight B, but not painful  LOWER EXTREMITY ROM:  Passive ROM Right eval Left eval  Hip flexion Mildly tight Mildly tight  Hip extension Mildly tight Mildly tight  Hip abduction    Hip adduction    Hip internal rotation Mildly tight Mildly tight  Hip external rotation Mildly tight Mildly tight  Knee flexion    Knee extension    Ankle dorsiflexion Mildly tight   Ankle plantarflexion    Ankle inversion WFL   Ankle eversion Mildly tight    (Blank rows = not tested)  LOWER EXTREMITY MMT:  MMT Right eval Left eval  Hip flexion 3+ 4  Hip extension 3+ 4-  Hip abduction 3+ 4  Hip adduction    Hip internal rotation    Hip external rotation    Knee flexion 5 5  Knee extension 5 5  Ankle dorsiflexion 5 5  Ankle plantarflexion    Ankle inversion    Ankle eversion     (Blank rows = not tested)  LOWER EXTREMITY SPECIAL TESTS:  Hip special tests: Ober's test: negative SLR test (-)  FUNCTIONAL TESGAIT: Distance walked: In clinic distances Assistive device utilized: None Level of assistance: Complete Independence Comments: Increased lateral sway, antalgic pattern   TODAY'S TREATMENT:                                                                                                                               DATE:  11/21/22 Bike level 4 x 5 minutes Feet on ball bridges, K2C, trunk rotation, isometric abs 5# hip abduction and extension, very difficult for abduction Focus today was on LE stretches all motions of the hip bilaterally 20 seconds x 4 each stretch Ionto 69mA dose to the right anterior lateral hip  11/17/22 NuStep L5 x 6 minutes.   11/15/22 Nustep L 5 5  min 6 inch step up fwd with opp leg ext 6 inch lateral step up with opp leg abd Leg press feet 3 way 10 each 30# Feet on ball bridge 10 x 3 sec hold, obls 20 x, isometric abdominals 10 x hold 3 sec Clams  and hip flexion 2 sets 10 green tband Add ball squeeze with bridge 2 sets 10 SL hip abd and circles CW and CCW 10 each BIL PROM/strecth BIL hips Ionto RT hip 1.1 cc dex 4 hour leave on patch    11/08/22 NuStep L3 x 5 minutes, U and LE Supine stretching-figure 4, glut stretch, B Stretching with strap, HS, ABD, ADD, B Supine strengthening, clamshells, Bridging with ABD against G tband x 10 Sit to stand x 10, Vc for form and to slow down. CP to R hip for anti inflammatory properties  11/03/22 Education    PATIENT EDUCATION:  Education details: POC Person educated: Patient Education method: Explanation Education comprehension: verbalized understanding  HOME EXERCISE PROGRAM:  [id number]  ASSESSMENT:  CLINICAL IMPRESSION: Patient continues to have pain my focus today was mostly the flexibility, she really did struggle with 5# hip abduction, could not complete due to weakness.  She is mildly tender in the anterior/lateral right hip, we did the 2nd ionto patch here OBJECTIVE IMPAIRMENTS: Abnormal gait, decreased activity tolerance, decreased balance, decreased coordination, decreased endurance, decreased mobility, difficulty walking, decreased ROM, decreased strength, impaired flexibility, postural dysfunction, and pain.   ACTIVITY LIMITATIONS: carrying, lifting, bending, standing, squatting,  sleeping, stairs, and locomotion level  PARTICIPATION LIMITATIONS: meal prep, cleaning, laundry, and medication management  PERSONAL FACTORS: Age and Past/current experiences are also affecting patient's functional outcome.   REHAB POTENTIAL: Good  CLINICAL DECISION MAKING: Evolving/moderate complexity  EVALUATION COMPLEXITY: Moderate   GOALS: Goals reviewed with patient? Yes  SHORT TERM GOALS: Target date: 11/17/22 I with initial HEP Baseline: Goal status: 11/08/22 ongoing  11/15/22 MET  LONG TERM GOALS: Target date: 01/26/23  I with final HEP Baseline:  Goal status: INITIAL  2.  Increase FOTO score to at least 57 Baseline: 44 Goal status: INITIAL  3.  Patient will be able to manage all steps step over pattern with LRAD, pain < 3/10 Baseline: Step to Goal status: INITIAL  4.  Patient will walk at least 500' with LRAD, with pain in R hip < 3/10 Baseline:  Goal status: ongoing 11/21/22  5.  Patient will verbalize all precautions for spinal stenosis and hip Bursitis in order better manage the long term effects of both. Baseline:  Goal status: INITIAL  PLAN:  PT FREQUENCY: 1-2x/week  PT DURATION: 12 weeks  PLANNED INTERVENTIONS: Therapeutic exercises, Therapeutic activity, Neuromuscular re-education, Balance training, Gait training, Patient/Family education, Self Care, Joint mobilization, Stair training, Dry Needling, Electrical stimulation, Spinal mobilization, Cryotherapy, Moist heat, Taping, Ionotophoresis 4mg /ml Dexamethasone, and Manual therapy  PLAN FOR NEXT SESSION: Assess tolerance to HEP, update as appropriate. [place]   Patient Details  Name: Kimberly Cox MRN: [id number] Date of Birth: 01-24-1946 Referring Provider:  No ref. provider found  Encounter Date: 11/21/2022  Lum Babe, PT 11/21/22 10:59 AM   Register at [place]. [place], [place], [address] Phone: [phone]    Fax:  [phone]

## 2022-11-24 DIAGNOSIS — M5116 Intervertebral disc disorders with radiculopathy, lumbar region: Secondary | ICD-10-CM | POA: Diagnosis not present

## 2022-11-25 ENCOUNTER — Ambulatory Visit: Payer: BC Managed Care – PPO | Admitting: Physical Therapy

## 2022-11-29 ENCOUNTER — Ambulatory Visit: Payer: BC Managed Care – PPO | Admitting: Physical Therapy

## 2022-11-30 ENCOUNTER — Ambulatory Visit: Payer: BC Managed Care – PPO | Admitting: Obstetrics & Gynecology

## 2022-12-12 ENCOUNTER — Ambulatory Visit: Payer: BC Managed Care – PPO | Admitting: Physical Therapy

## 2022-12-15 ENCOUNTER — Ambulatory Visit: Payer: BC Managed Care – PPO | Admitting: Physical Therapy

## 2022-12-19 DIAGNOSIS — M25551 Pain in right hip: Secondary | ICD-10-CM | POA: Diagnosis not present

## 2022-12-20 DIAGNOSIS — M25551 Pain in right hip: Secondary | ICD-10-CM | POA: Diagnosis not present

## 2023-02-06 ENCOUNTER — Ambulatory Visit (INDEPENDENT_AMBULATORY_CARE_PROVIDER_SITE_OTHER): Payer: BC Managed Care – PPO | Admitting: Orthopaedic Surgery

## 2023-02-06 ENCOUNTER — Other Ambulatory Visit (INDEPENDENT_AMBULATORY_CARE_PROVIDER_SITE_OTHER): Payer: BC Managed Care – PPO

## 2023-02-06 ENCOUNTER — Other Ambulatory Visit: Payer: Self-pay

## 2023-02-06 DIAGNOSIS — M25551 Pain in right hip: Secondary | ICD-10-CM

## 2023-02-06 MED ORDER — CELECOXIB 100 MG PO CAPS: 100.0000 mg | ORAL_CAPSULE | Freq: Two times a day (BID) | ORAL | 1 refills | Status: DC | PRN

## 2023-02-06 NOTE — Progress Notes (Signed)
The patient is a 77 year old female that I am seeing for the first time for her right hip.  She does come in ambulating with a cane.  She has been having worsening hip pain for over a year now.  She is a regular patient of Dr. Danielle Dess with neurosurgery who did send her to Dr. Renaye Rakers who appropriately assessed her right hip.  She has had intra-articular injection of the right hip and an injection over the trochanteric area.  She still has a lot of pain around this area with weightbearing.  She denies any groin pain.  She says even injections are not really helping her anymore.  On exam her right hip moves smoothly and fluidly as is her left hip.  There is no blocks to rotation and no pain in the groin at all.  She only has mild to moderate pain over the trochanteric area on the right side to palpation.  It is only mild.  An AP pelvis and lateral the right hip shows normal-appearing hips bilaterally and no cortical irregularities around the trochanteric area of either hip and no significant arthritis.  At this point I feel it is appropriate to order a MRI of the right hip to look for any other pathology around the hip that is causing her to have such significant pain.  She is also failed conservative treatment with multiple injections as well as anti-inflammatories, time and physical therapy.  I will at least send in some Celebrex for her to try and we will see her back after the MRI.  She agrees with this treatment plan.  All question concerns were addressed and answered.

## 2023-02-08 ENCOUNTER — Ambulatory Visit (INDEPENDENT_AMBULATORY_CARE_PROVIDER_SITE_OTHER): Payer: BC Managed Care – PPO | Admitting: Obstetrics & Gynecology

## 2023-02-08 ENCOUNTER — Encounter: Payer: Self-pay | Admitting: Obstetrics & Gynecology

## 2023-02-08 VITALS — BP 112/78 | HR 92 | Ht 64.25 in | Wt 156.0 lb

## 2023-02-08 DIAGNOSIS — Z01419 Encounter for gynecological examination (general) (routine) without abnormal findings: Secondary | ICD-10-CM | POA: Diagnosis not present

## 2023-02-08 DIAGNOSIS — Z780 Asymptomatic menopausal state: Secondary | ICD-10-CM

## 2023-02-08 NOTE — Progress Notes (Signed)
Kimberly Cox [birth year] [id number]   History:    77 y.o. G2P2L2 Married   RP:  Established patient presenting for annual gyn exam    HPI: Postmenopause, well on no HRT.  No PMB.  No pelvic pain. Not sexually active anymore.  Pap 09/2021 Neg.  No h/o abnormal Pap.  No indication to repeat Pap at this time.  Urine/BMs wnl.  Breasts wnl.  Mammo 06/2022 Neg. BMI 26.57.  Not regularly physically active, limited by Spinal Stenosis and now Rt hip pain under investigation. Health Labs with Fam MD. BD Normal in 01/2022.  Cologuard neg 2022.    Past medical history,surgical history, family history and social history were all reviewed and documented in the EPIC chart.  Gynecologic History No LMP recorded. Patient is postmenopausal.  Obstetric History OB History  Gravida Para Term Preterm AB Living  2 2 1 1  0 2  SAB IAB Ectopic Multiple Live Births  0   0        # Outcome Date GA Lbr Len/2nd Weight Sex Delivery Anes PTL Lv  2 Preterm           1 Term              ROS: A ROS was performed and pertinent positives and negatives are included in the history. GENERAL: No fevers or chills. HEENT: No change in vision, no earache, sore throat or sinus congestion. NECK: No pain or stiffness. CARDIOVASCULAR: No chest pain or pressure. No palpitations. PULMONARY: No shortness of breath, cough or wheeze. GASTROINTESTINAL: No abdominal pain, nausea, vomiting or diarrhea, melena or bright red blood per rectum. GENITOURINARY: No urinary frequency, urgency, hesitancy or dysuria. MUSCULOSKELETAL: No joint or muscle pain, no back pain, no recent trauma. DERMATOLOGIC: No rash, no itching, no lesions. ENDOCRINE: No polyuria, polydipsia, no heat or cold intolerance. No recent change in weight. HEMATOLOGICAL: No anemia or easy bruising or bleeding. NEUROLOGIC: No headache, seizures, numbness, tingling or weakness. PSYCHIATRIC: No depression, no loss of interest in normal activity or change in sleep pattern.      Exam:   BP 112/78   Pulse 92   Ht 5' 4.25" (1.632 m)   Wt 156 lb (70.8 kg)   SpO2 99%   BMI 26.57 kg/m   Body mass index is 26.57 kg/m.  General appearance : Well developed well nourished female. No acute distress HEENT: Eyes: no retinal hemorrhage or exudates,  Neck supple, trachea midline, no carotid bruits, no thyroidmegaly Lungs: Clear to auscultation, no rhonchi or wheezes, or rib retractions  Heart: Regular rate and rhythm, no murmurs or gallops Breast:Examined in sitting and supine position were symmetrical in appearance, no palpable masses or tenderness,  no skin retraction, no nipple inversion, no nipple discharge, no skin discoloration, no axillary or supraclavicular lymphadenopathy Abdomen: no palpable masses or tenderness, no rebound or guarding Extremities: no edema or skin discoloration or tenderness  Pelvic: Vulva: Normal             Vagina: No gross lesions or discharge  Cervix: No gross lesions or discharge  Uterus  AV, normal size, shape and consistency, non-tender and mobile  Adnexa  Without masses or tenderness  Anus: Normal   Assessment/Plan:  77 y.o. female for annual exam   1. Well female exam with routine gynecological exam Postmenopause, well on no HRT.  No PMB.  No pelvic pain. Not sexually active anymore.  Pap 09/2021 Neg.  No h/o abnormal Pap.  No indication to  repeat Pap at this time.  Urine/BMs wnl.  Breasts wnl.  Mammo 06/2022 Neg. BMI 26.57.  Not regularly physically active, limited by Spinal Stenosis and now Rt hip pain under investigation. Health Labs with Fam MD. BD Normal in 01/2022.  Cologuard neg 2022.   2. Postmenopause Postmenopause, well on no HRT.  No PMB.  No pelvic pain. Not sexually active anymore.   Other orders - clobetasol (TEMOVATE) 0.05 % external solution; Apply topically daily.   [name] MD, 2:02 PM

## 2023-02-26 ENCOUNTER — Ambulatory Visit
Admission: RE | Admit: 2023-02-26 | Discharge: 2023-02-26 | Disposition: A | Discharge status: Home / Self Care | Payer: BC Managed Care – PPO | Source: Ambulatory Visit | Attending: Orthopaedic Surgery | Admitting: Orthopaedic Surgery

## 2023-02-26 DIAGNOSIS — S76311A Strain of muscle, fascia and tendon of the posterior muscle group at thigh level, right thigh, initial encounter: Secondary | ICD-10-CM | POA: Diagnosis not present

## 2023-02-26 DIAGNOSIS — M25551 Pain in right hip: Secondary | ICD-10-CM

## 2023-02-26 DIAGNOSIS — M1611 Unilateral primary osteoarthritis, right hip: Secondary | ICD-10-CM | POA: Diagnosis not present

## 2023-02-26 DIAGNOSIS — M7061 Trochanteric bursitis, right hip: Secondary | ICD-10-CM | POA: Diagnosis not present

## 2023-03-02 ENCOUNTER — Ambulatory Visit: Payer: BC Managed Care – PPO | Admitting: Physician Assistant

## 2023-03-02 ENCOUNTER — Ambulatory Visit (INDEPENDENT_AMBULATORY_CARE_PROVIDER_SITE_OTHER): Payer: BC Managed Care – PPO | Admitting: Physician Assistant

## 2023-03-02 ENCOUNTER — Encounter: Payer: Self-pay | Admitting: Physician Assistant

## 2023-03-02 DIAGNOSIS — M25551 Pain in right hip: Secondary | ICD-10-CM | POA: Diagnosis not present

## 2023-03-02 NOTE — Progress Notes (Signed)
HPI: Kimberly Cox returns today to go over the MRI of her right hip.  She continues to have right hip pain despite conservative measures which have included an intra-articular injection which gave her good relief for about a week.  She is also had 2 trochanteric injections first 1 gave her some relief second 1 gave her no relief.  Patient denies any groin pain.  Pain mostly lateral aspect of the hip.  No radicular symptoms down the leg.  She has been taking Celebrex and Tylenol.  She not sure if the Celebrex on its own was really helping therefore began the Tylenol she is taking 3 g of Tylenol a day and this combination with the Celebrex seems to help some. MRI right hip without contrast dated 03/01/2023 is reviewed with the patient.  Images reviewed.  This shows right gluteus minimus tendon with severe tendinosis.  There is also high-grade partial-thickness tear at the insertion.  Mild right gluteus medius tendinosis without tear.  Mild right hip osteoarthritis.  Trochanteric bursitis on the right is mild.  Mild edema is seen intermuscular portion of the proximal abductor compartment muscular EXTR.  Right greater than left.  Right gluteus medius muscle edema also seen felt to reflect low-grade strains.  Review of systems: See HPI otherwise negative or noncontributory Physical exam: General well-developed well-nourished female no acute distress mood and affect appropriate.  Bilateral hips good range of motion without pain.  She has tenderness over the right greater trochanteric region.  She also has weakness of the right hip 4 out of 5 with abduction against resistance.  Compared to the left side which is 5 out of 5 against resistance.  Impression: Right hip severe tendinosis right gluteus minimus tendon with high-grade partial-thickness tear at the insertion. Right hip osteoarthritis mild  Plan: At this point time we will send her formal physical therapy to work on right hip abductor strength, this will  include modalities and home exercise program.  She will perform this for 6 weeks.  She also be referred to Dr. Shon Baton for shockwave therapy to the right gluteus minimus tendon region.  She will continue her Celebrex and Tylenol at this time.  Will see her back here in about 6 to 7 weeks to see how she is doing overall.

## 2023-03-03 ENCOUNTER — Other Ambulatory Visit: Payer: Self-pay

## 2023-03-03 ENCOUNTER — Telehealth: Payer: Self-pay | Admitting: Physician Assistant

## 2023-03-03 DIAGNOSIS — M25551 Pain in right hip: Secondary | ICD-10-CM

## 2023-03-03 NOTE — Telephone Encounter (Signed)
Patient would like handicap placard please advise 

## 2023-03-03 NOTE — Telephone Encounter (Signed)
Patient aware handicap at front desk  

## 2023-03-13 NOTE — Therapy (Signed)
OUTPATIENT PHYSICAL THERAPY LOWER EXTREMITY EVALUATION   Patient Name: Kimberly Cox MRN: [id number] DOB:Jan 07, 1946, 77 y.o., female Today's Date: 03/13/2023  END OF SESSION:   Past Medical History:  Diagnosis Date   Anxiety    Anxiety disorder    Concussion Dec 13th, 2011   fell, striking her head against a wall, developed dizziness - UC eval c/w concussion. Had follow-up and did ok.    History of chest pain    Hyperlipidemia    Hypertension    Plantar fasciitis    Spinal stenosis, lumbar    Past Surgical History:  Procedure Laterality Date   ARTHRODESIS METATARSALPHALANGEAL JOINT (MTPJ) Right 10/08/2015   Procedure: RIGHT HALLUX METATARSAL PHALANGEAL JOINT  ARTHRODESIS;  Surgeon: Toni Arthurs, MD;  Location: [facility];  Service: Orthopedics;  Laterality: Right;   caesarean section     CESAREAN SECTION     HAMMERTOE RECONSTRUCTION WITH WEIL OSTEOTOMY Right 10/08/2015   Procedure: RIGHT SECOND METATARSAL WEIL HAMMERTOE CORRECTION;  Surgeon: Toni Arthurs, MD;  Location: [facility];  Service: Orthopedics;  Laterality: Right;   TONSILLECTOMY     Patient Active Problem List   Diagnosis Date Noted   Diuretic-induced hypokalemia 03/31/2020   Sciatica of right side 05/01/2018   Piriformis syndrome of left side 11/27/2017   GERD with esophagitis 08/22/2017   Asthmatic bronchitis 08/22/2017   Prediabetes 02/02/2017   Pure hyperglyceridemia 02/02/2017   Degenerative cervical disc 01/26/2016   Degenerative lumbar disc 01/26/2016   Spinal stenosis of lumbar region with radiculopathy 01/20/2016   Routine general medical examination at a health care facility 07/18/2014   Hereditary and idiopathic peripheral neuropathy 01/31/2008   Hyperlipidemia LDL goal <130 12/05/2007   Essential hypertension 12/05/2007    PCP: Sanda Linger  REFERRING PROVIDER: Richardean Canal  REFERRING DIAG: M25.551 (ICD-10-CM) - Pain of right hip   THERAPY DIAG:  No diagnosis  found.  Rationale for Evaluation and Treatment: Rehabilitation  ONSET DATE: 03/03/23  SUBJECTIVE:   SUBJECTIVE STATEMENT: ***  PERTINENT HISTORY: Spinal stenosis at L3-L4 intra-articular injection gave her good relief for about a week. She is also had 2 trochanteric injections first 1 gave her some relief second 1 gave her no relief. Patient denies any groin pain. Pain mostly lateral aspect of the hip. No radicular symptoms down the leg.  PAIN:  Are you having pain? {OPRCPAIN:27236}  PRECAUTIONS: {Therapy precautions:24002}  WEIGHT BEARING RESTRICTIONS: {Yes ***/No:24003}  FALLS:  Has patient fallen in last 6 months? {fallsyesno:27318}  LIVING ENVIRONMENT: Lives with: {OPRC lives with:25569::"lives with their family"} Lives in: {Lives in:25570} Stairs: {opstairs:27293} Has following equipment at home: {Assistive devices:23999}  OCCUPATION: ***  PLOF: {PLOF:24004}  PATIENT GOALS: ***  NEXT MD VISIT: ***  OBJECTIVE:   DIAGNOSTIC FINDINGS: MRI right hip without contrast dated 03/01/2023 is reviewed with the patient.  Images reviewed. This shows right gluteus minimus tendon with severe tendinosis.  There is also high-grade partial-thickness tear at the insertion.  Mild right gluteus medius tendinosis without tear.  Mild right hip osteoarthritis.  Trochanteric bursitis on the right is mild.  Mild edema is seen intermuscular portion of the proximal abductor compartment muscular EXTR.  Right greater than left.  Right gluteus medius muscle edema also seen felt to reflect low-grade strains.   Impression: Right hip severe tendinosis right gluteus minimus tendon with high-grade partial-thickness tear at the insertion. Right hip osteoarthritis mild  PATIENT SURVEYS:  {rehab surveys:24030}  COGNITION: Overall cognitive status: {cognition:24006}     SENSATION: {  sensation:27233}  EDEMA:  {edema:24020}  MUSCLE LENGTH: Hamstrings: Right *** deg; Left *** deg Maisie Fus test: Right  *** deg; Left *** deg  POSTURE: {posture:25561}  PALPATION: She has tenderness over the right greater trochanteric region.   LOWER EXTREMITY ROM:  {AROM/PROM:27142} ROM Right eval Left eval  Hip flexion    Hip extension    Hip abduction    Hip adduction    Hip internal rotation    Hip external rotation    Knee flexion    Knee extension    Ankle dorsiflexion    Ankle plantarflexion    Ankle inversion    Ankle eversion     (Blank rows = not tested)  LOWER EXTREMITY MMT:  MMT Right eval Left eval  Hip flexion    Hip extension    Hip abduction    Hip adduction    Hip internal rotation    Hip external rotation    Knee flexion    Knee extension    Ankle dorsiflexion    Ankle plantarflexion    Ankle inversion    Ankle eversion     (Blank rows = not tested)  LOWER EXTREMITY SPECIAL TESTS:  {LEspecialtests:26242}  FUNCTIONAL TESTS:  {Functional tests:24029}  GAIT: Distance walked: *** Assistive device utilized: {Assistive devices:23999} Level of assistance: {Levels of assistance:24026} Comments: ***   TODAY'S TREATMENT:                                                                                                                              DATE: ***    PATIENT EDUCATION:  Education details: *** Person educated: {Person educated:25204} Education method: {Education Method:25205} Education comprehension: {Education Comprehension:25206}  HOME EXERCISE PROGRAM: ***  ASSESSMENT:  CLINICAL IMPRESSION: Patient is a *** y.o. *** who was seen today for physical therapy evaluation and treatment for ***.   OBJECTIVE IMPAIRMENTS: {opptimpairments:25111}.   ACTIVITY LIMITATIONS: {activitylimitations:27494}  PARTICIPATION LIMITATIONS: {participationrestrictions:25113}  PERSONAL FACTORS: {Personal factors:25162} are also affecting patient's functional outcome.   REHAB POTENTIAL: {rehabpotential:25112}  CLINICAL DECISION MAKING: {clinical decision  making:25114}  EVALUATION COMPLEXITY: {Evaluation complexity:25115}   GOALS: Goals reviewed with patient? {yes/no:20286}  SHORT TERM GOALS: Target date: *** *** Baseline: Goal status: INITIAL  2.  *** Baseline:  Goal status: INITIAL  3.  *** Baseline:  Goal status: INITIAL  4.  *** Baseline:  Goal status: INITIAL  5.  *** Baseline:  Goal status: INITIAL  6.  *** Baseline:  Goal status: INITIAL  LONG TERM GOALS: Target date: ***  *** Baseline:  Goal status: INITIAL  2.  *** Baseline:  Goal status: INITIAL  3.  *** Baseline:  Goal status: INITIAL  4.  *** Baseline:  Goal status: INITIAL  5.  *** Baseline:  Goal status: INITIAL  6.  *** Baseline:  Goal status: INITIAL   PLAN:  PT FREQUENCY: {rehab frequency:25116}  PT DURATION: {rehab duration:25117}  PLANNED INTERVENTIONS: {rehab planned interventions:25118::"Therapeutic exercises","Therapeutic activity","Neuromuscular re-education","Balance training","Gait training","Patient/Family  education","Self Care","Joint mobilization"}  PLAN FOR NEXT SESSION: ***   Cassie Freer, PT 03/13/2023, 5:06 PM

## 2023-03-14 ENCOUNTER — Ambulatory Visit: Payer: BC Managed Care – PPO | Admitting: Physician Assistant

## 2023-03-14 DIAGNOSIS — D225 Melanocytic nevi of trunk: Secondary | ICD-10-CM | POA: Diagnosis not present

## 2023-03-14 DIAGNOSIS — Z85828 Personal history of other malignant neoplasm of skin: Secondary | ICD-10-CM | POA: Diagnosis not present

## 2023-03-14 DIAGNOSIS — D2272 Melanocytic nevi of left lower limb, including hip: Secondary | ICD-10-CM | POA: Diagnosis not present

## 2023-03-14 DIAGNOSIS — I788 Other diseases of capillaries: Secondary | ICD-10-CM | POA: Diagnosis not present

## 2023-03-15 ENCOUNTER — Ambulatory Visit: Payer: BC Managed Care – PPO | Attending: Physician Assistant

## 2023-03-15 DIAGNOSIS — R278 Other lack of coordination: Secondary | ICD-10-CM | POA: Diagnosis not present

## 2023-03-15 DIAGNOSIS — M25551 Pain in right hip: Secondary | ICD-10-CM | POA: Insufficient documentation

## 2023-03-15 DIAGNOSIS — R262 Difficulty in walking, not elsewhere classified: Secondary | ICD-10-CM | POA: Diagnosis not present

## 2023-03-15 DIAGNOSIS — M6281 Muscle weakness (generalized): Secondary | ICD-10-CM | POA: Insufficient documentation

## 2023-03-20 ENCOUNTER — Encounter: Payer: Self-pay | Admitting: Sports Medicine

## 2023-03-20 ENCOUNTER — Ambulatory Visit: Payer: BC Managed Care – PPO | Admitting: Sports Medicine

## 2023-03-20 DIAGNOSIS — S76011D Strain of muscle, fascia and tendon of right hip, subsequent encounter: Secondary | ICD-10-CM

## 2023-03-20 DIAGNOSIS — M7061 Trochanteric bursitis, right hip: Secondary | ICD-10-CM | POA: Diagnosis not present

## 2023-03-20 DIAGNOSIS — M67951 Unspecified disorder of synovium and tendon, right thigh: Secondary | ICD-10-CM

## 2023-03-20 NOTE — Progress Notes (Signed)
Kimberly Cox - 77 y.o. female MRN [id number]  Date of birth: Sep 08, 1945  Office Visit Note: Visit Date: 03/20/2023 PCP: Etta Grandchild, MD Referred by: Etta Grandchild, MD  Subjective: Chief Complaint  Patient presents with   Right Hip - Pain   HPI: Kimberly Cox is a pleasant 77 y.o. female who presents today for right lateral hip pain.  Pain has been worse for over a year.  Previously was need to ambulate with a cane.  No longer needing to use a cane but does have pain with walking prolonged standing.  Is using Celebrex 100 mg twice daily, also using Tylenol.  Previous treatments: have included an intra-articular injection which gave her good relief for about a week. She is also had 2 trochanteric injections first 1 gave her some relief second 1 gave her no relief. Patient denies any groin pain. Seen by Dr. Magnus Ivan and Rexene Edison - referred over for further management and consideration of shockwave therapy.  Pertinent ROS were reviewed with the patient and found to be negative unless otherwise specified above in HPI.   Assessment & Plan: Visit Diagnoses:  1. Trochanteric bursitis, right hip   2. Tear of right gluteus minimus tendon, subsequent encounter   3. Tendinopathy of right gluteus medius    Plan: Did review [name]'s MRI and discussed treatment options for her right lateral hip pain.  Through shared decision-making, elected to proceed with a trial of extracorporeal shockwave therapy.  We will Xu for treatments about 1-2 weeks apart.  It is very important she gets into formalized physical therapy as well, she is having this this upcoming week.  She may continue her Celebrex 100 mg twice daily in the interim.  Discussed other treatment options such as PRP injection therapy.  Can discuss the role of nitroglycerin patch protocol as well to augment blood flow and healing. F/u in 1-2 weeks.  Follow-up: Return for f/u in 1-2 weeks for lateral hip pain (reg visit).   Meds & Orders:  No orders of the defined types were placed in this encounter.  No orders of the defined types were placed in this encounter.    Procedures: Procedure: ECSWT Indications:  Gluteus minimus/medius tendinopathy   Procedure Details Consent: Risks of procedure as well as the alternatives and risks of each were explained to the patient.  Verbal consent for procedure obtained. Time Out: Verified patient identification, verified procedure, site was marked, verified correct patient position. The area was cleaned with alcohol swab.     The right GT and gluteus tendons were targeted for Extracorporeal shockwave therapy.    Preset: Trochanteric bursitis/lateral hip pain Power Level: 120 mJ Frequency: 10-11 Hz Impulse/cycles: 2800 Head size: Regular   Patient tolerated procedure well without immediate complications.       Clinical History: No specialty comments available.  She reports that she has quit smoking. She has never used smokeless tobacco. No results for input(s): "HGBA1C", "LABURIC" in the last 8760 hours.  Objective:   Vital Signs: There were no vitals taken for this visit.  Physical Exam  Gen: Well-appearing, in no acute distress; non-toxic CV: Well-perfused. Warm.  Resp: Breathing unlabored on room air; no wheezing. Psych: Fluid speech in conversation; appropriate affect; normal thought process Neuro: Sensation intact throughout. No gross coordination deficits.   Ortho Exam - Right hip: + TTP over the region of the greater trochanter in the posterior lateral aspect.  There is 4/5 resisted hip abduction strength compared to 5/5  strength of the contralateral hip.  No mechanical blocks to internal or external rotation.  Imaging:  *Independent review and interpretation of the R-hip MRI from 03/01/23 was performed by myself today which shows severe tendinopathy at the insertion of the gluteus minimus tendon over the greater trochanter, there is at least moderate gluteus medius  tendinopathy as well no discrete tear but there is intratendinous edema at the insertion.  Mild trochanteric bursitis as well.  Mild hip OA.  MR Hip Right w/o contrast CLINICAL DATA:  Hip pain, chronic, articular cartilage eval, xray done assess cartilage, troch bursitis, assess tendon  EXAM: MR OF THE RIGHT HIP WITHOUT CONTRAST  TECHNIQUE: Multiplanar, multisequence MR imaging was performed. No intravenous contrast was administered.  COMPARISON:  X-ray 02/06/2023  FINDINGS: Bones: No acute fracture. No dislocation. No femoral head avascular necrosis. Bony pelvis intact without diastasis. SI joints and pubic symphysis within normal limits. Degenerative lower lumbar spondylosis, not well assessed. No bone marrow edema. No marrow replacing bone lesion.  Articular cartilage and labrum  Articular cartilage: Mild chondral thinning. There is fissuring along the superior chondrolabral junction. No subchondral marrow edema.  Labrum:  Superior labral degeneration.  No paralabral cyst.  Joint or bursal effusion  Joint effusion:  None.  Bursae: Small-moderate volume right peritrochanteric bursal fluid.  Muscles and tendons  Muscles and tendons: Severe tendinosis of the right gluteus minimus tendon with high-grade partial-thickness insertional tearing. Mild right gluteus medius tendinosis without tear. Mild tendinosis of the contralateral left gluteal tendons. The hamstring, iliopsoas, rectus femoris, and adductor tendons appear intact without tear or significant tendinosis. Mild intramuscular edema within the proximal adductor compartment musculature, right greater than left. Mild intramuscular edema within the right gluteus medius muscle.  Other findings  Miscellaneous: No soft tissue edema or fluid collection. No inguinal lymphadenopathy.  IMPRESSION: 1. Severe tendinosis of the right gluteus minimus tendon with high-grade partial-thickness insertional tearing. Mild right  gluteus medius tendinosis without tear. 2. Mild-moderate right peritrochanteric bursitis. 3. Mild intramuscular edema within the proximal adductor compartment musculature, right greater than left, and right gluteus medius muscle which may reflect low-grade strains. 4. Mild right hip osteoarthritis.  Electronically Signed   By: Duanne Guess D.O.   On: 03/01/2023 13:55    Past Medical/Family/Surgical/Social History: Medications & Allergies reviewed per EMR, new medications updated. Patient Active Problem List   Diagnosis Date Noted   Diuretic-induced hypokalemia 03/31/2020   Sciatica of right side 05/01/2018   Piriformis syndrome of left side 11/27/2017   GERD with esophagitis 08/22/2017   Asthmatic bronchitis 08/22/2017   Prediabetes 02/02/2017   Pure hyperglyceridemia 02/02/2017   Degenerative cervical disc 01/26/2016   Degenerative lumbar disc 01/26/2016   Spinal stenosis of lumbar region with radiculopathy 01/20/2016   Routine general medical examination at a health care facility 07/18/2014   Hereditary and idiopathic peripheral neuropathy 01/31/2008   Hyperlipidemia LDL goal <130 12/05/2007   Essential hypertension 12/05/2007   Past Medical History:  Diagnosis Date   Anxiety    Anxiety disorder    Concussion Dec 13th, 2011   fell, striking her head against a wall, developed dizziness - UC eval c/w concussion. Had follow-up and did ok.    History of chest pain    Hyperlipidemia    Hypertension    Plantar fasciitis    Spinal stenosis, lumbar    Family History  Problem Relation Age of Onset   Cancer Mother    Coronary artery disease Father    Past Surgical History:  Procedure Laterality Date   ARTHRODESIS METATARSALPHALANGEAL JOINT (MTPJ) Right 10/08/2015   Procedure: RIGHT HALLUX METATARSAL PHALANGEAL JOINT  ARTHRODESIS;  Surgeon: Toni Arthurs, MD;  Location: [facility];  Service: Orthopedics;  Laterality: Right;   caesarean section     CESAREAN  SECTION     HAMMERTOE RECONSTRUCTION WITH WEIL OSTEOTOMY Right 10/08/2015   Procedure: RIGHT SECOND METATARSAL WEIL HAMMERTOE CORRECTION;  Surgeon: Toni Arthurs, MD;  Location: [facility];  Service: Orthopedics;  Laterality: Right;   TONSILLECTOMY     Social History   Occupational History   Not on file  Tobacco Use   Smoking status: Former   Smokeless tobacco: Never   Tobacco comments:    only in h.s.  Vaping Use   Vaping status: Never Used  Substance and Sexual Activity   Alcohol use: Yes    Comment: occ   Drug use: No   Sexual activity: Not Currently    Birth control/protection: Post-menopausal    Comment: 1st intercourse- 20, partners- 1

## 2023-03-20 NOTE — Progress Notes (Signed)
[facility] referral for shockwave treatment

## 2023-03-21 ENCOUNTER — Ambulatory Visit: Payer: BC Managed Care – PPO | Admitting: Physical Therapy

## 2023-03-21 DIAGNOSIS — R278 Other lack of coordination: Secondary | ICD-10-CM | POA: Diagnosis not present

## 2023-03-21 DIAGNOSIS — M6281 Muscle weakness (generalized): Secondary | ICD-10-CM

## 2023-03-21 DIAGNOSIS — M25551 Pain in right hip: Secondary | ICD-10-CM | POA: Diagnosis not present

## 2023-03-21 DIAGNOSIS — R262 Difficulty in walking, not elsewhere classified: Secondary | ICD-10-CM | POA: Diagnosis not present

## 2023-03-21 NOTE — Therapy (Signed)
OUTPATIENT PHYSICAL THERAPY LOWER EXTREMITY    Patient Name: Kimberly Cox MRN: [id number] DOB:[birth year], 77 y.o., female Today's Date: 03/21/2023  END OF SESSION:  PT End of Session - 03/21/23 1315     Visit Number 2    Date for PT Re-Evaluation 05/17/23    PT Start Time 1315    PT Stop Time 1400    PT Time Calculation (min) 45 min    Activity Tolerance Patient tolerated treatment well    Behavior During Therapy [facility] for tasks assessed/performed             Past Medical History:  Diagnosis Date   Anxiety    Anxiety disorder    Concussion Dec 13th, 2011   fell, striking her head against a wall, developed dizziness - UC eval c/w concussion. Had follow-up and did ok.    History of chest pain    Hyperlipidemia    Hypertension    Plantar fasciitis    Spinal stenosis, lumbar    Past Surgical History:  Procedure Laterality Date   ARTHRODESIS METATARSALPHALANGEAL JOINT (MTPJ) Right 10/08/2015   Procedure: RIGHT HALLUX METATARSAL PHALANGEAL JOINT  ARTHRODESIS;  Surgeon: Toni Arthurs, MD;  Location: [facility];  Service: Orthopedics;  Laterality: Right;   caesarean section     CESAREAN SECTION     HAMMERTOE RECONSTRUCTION WITH WEIL OSTEOTOMY Right 10/08/2015   Procedure: RIGHT SECOND METATARSAL WEIL HAMMERTOE CORRECTION;  Surgeon: Toni Arthurs, MD;  Location: [facility];  Service: Orthopedics;  Laterality: Right;   TONSILLECTOMY     Patient Active Problem List   Diagnosis Date Noted   Diuretic-induced hypokalemia 03/31/2020   Sciatica of right side 05/01/2018   Piriformis syndrome of left side 11/27/2017   GERD with esophagitis 08/22/2017   Asthmatic bronchitis 08/22/2017   Prediabetes 02/02/2017   Pure hyperglyceridemia 02/02/2017   Degenerative cervical disc 01/26/2016   Degenerative lumbar disc 01/26/2016   Spinal stenosis of lumbar region with radiculopathy 01/20/2016   Routine general medical examination at a health care facility  07/18/2014   Hereditary and idiopathic peripheral neuropathy 01/31/2008   Hyperlipidemia LDL goal <130 12/05/2007   Essential hypertension 12/05/2007    PCP: Sanda Linger  REFERRING PROVIDER: Richardean Canal  REFERRING DIAG: M25.551 (ICD-10-CM) - Pain of right hip   THERAPY DIAG:  Pain in right hip  Muscle weakness (generalized)  Difficulty in walking, not elsewhere classified  Rationale for Evaluation and Treatment: Rehabilitation  ONSET DATE: 03/03/23  SUBJECTIVE:   SUBJECTIVE STATEMENT: "Same as I have been doing"   PERTINENT HISTORY: Spinal stenosis at L3-L4 intra-articular injection gave her good relief for about a week. She is also had 2 trochanteric injections first 1 gave her some relief second 1 gave her no relief. Patient denies any groin pain. Pain mostly lateral aspect of the hip. No radicular symptoms down the leg.   PAIN:  Are you having pain? Yes: NPRS scale: 3/10 Pain location: R hip, starting to travel down the leg  Pain description: dull, ache Aggravating factors: walking, steps Relieving factors: medication   PRECAUTIONS: None  WEIGHT BEARING RESTRICTIONS: No  FALLS:  Has patient fallen in last 6 months? No  LIVING ENVIRONMENT: Lives with: lives with their spouse Lives in: House/apartment Stairs: No Has following equipment at home: Single point cane  OCCUPATION: Retired  PLOF: Independent  PATIENT GOALS: to get rid of the pain  NEXT MD VISIT: 03/20/23  OBJECTIVE:   DIAGNOSTIC FINDINGS: MRI right hip without contrast  dated 03/01/2023 is reviewed with the patient.  Images reviewed. This shows right gluteus minimus tendon with severe tendinosis.  There is also high-grade partial-thickness tear at the insertion.  Mild right gluteus medius tendinosis without tear.  Mild right hip osteoarthritis.  Trochanteric bursitis on the right is mild.  Mild edema is seen intermuscular portion of the proximal abductor compartment muscular EXTR.  Right greater  than left.  Right gluteus medius muscle edema also seen felt to reflect low-grade strains.   Impression: Right hip severe tendinosis right gluteus minimus tendon with high-grade partial-thickness tear at the insertion. Right hip osteoarthritis mild  PATIENT SURVEYS:  FOTO 41  COGNITION: Overall cognitive status: Within functional limits for tasks assessed     SENSATION: WFL  MUSCLE LENGTH: Tightness in BLE  PALPATION: She has tenderness over the right greater trochanteric region.   LOWER EXTREMITY ROM: WFL    LOWER EXTREMITY MMT:  MMT Right eval Left eval  Hip flexion 4+ 5  Hip extension 4-   Hip abduction 4-   Hip adduction    Hip internal rotation    Hip external rotation    Knee flexion 5 5  Knee extension 5 5  Ankle dorsiflexion    Ankle plantarflexion    Ankle inversion    Ankle eversion     (Blank rows = not tested)  FUNCTIONAL TESTS:  5 times sit to stand: 12.43s   TODAY'S TREATMENT:                                                                                                                              DATE:   03/21/23 Seated abd/marches blue t-band 2x10 HS curls red t-band 2x10 R hip med/lat rot yellow t-band 2x10 Bridges 2x10 Trunk rotation 2x10 SLR R 2lb 2x10 LE PROM and stretching; emphasis on ITB Standing R hip ext and adduction red t-band, B abduction 2x10   03/15/23- EVAL    PATIENT EDUCATION:  Education details: HEP and POC Person educated: Patient Education method: Explanation Education comprehension: verbalized understanding  HOME EXERCISE PROGRAM: Access Code: [email or web address] URL: [email or web address] Date: 03/15/2023 Prepared by: Cassie Freer  Exercises - Supine Bridge  - 1 x daily - 7 x weekly - 2 sets - 10 reps - Beginner Side Leg Lift  - 1 x daily - 7 x weekly - 2 sets - 10 reps - Clamshell with Resistance  - 1 x daily - 7 x weekly - 2 sets - 10 reps - Supine Piriformis Stretch with Foot on Ground  - 1 x  daily - 7 x weekly - 2 reps - 15-30 hold  ASSESSMENT:  CLINICAL IMPRESSION: Patient is a 77 y.o. female who was seen today for physical therapy treatment for R hip pain. Her images show right gluteus minimus severe tendinosis and a high-grade partial-thickness tear at the insertion. She also has mild right gluteus medius tendinosis without tear.  Mild right hip osteoarthritis and trochanteric bursitis on  the right. She received shock treatment to her R hip that she will continue within the next few weeks for pain relief. Pt stated she is I with HEP and has pain only when walking. We did some hip strengthening exercises which was tolerated well with no pain. Verbal cues were needed during HS curls to control the eccentric portion, and tactile cues were needed during standing hip abduction for proper form to target the correct muscles. She has R LE tightness as shown by PROM and when stretching her ITB she had an increase ROM, which the pt noticed so she was educated on the importance of stretching and improving ROM to help with her pain tolerance when walking. She questioned if an epidural is safe to still receive during physical therapy, and the SPTA told her yes. She would benefit from continued PT to increase her strength and ease of walking.  OBJECTIVE IMPAIRMENTS: difficulty walking, decreased strength, and pain.   REHAB POTENTIAL: Good  CLINICAL DECISION MAKING: Stable/uncomplicated  EVALUATION COMPLEXITY: Low   GOALS: Goals reviewed with patient? Yes  SHORT TERM GOALS: Target date: 04/12/23  Patient will be independent with initial HEP. Goal status: INITIAL   LONG TERM GOALS: Target date: 05/17/23  Patient will be independent with advanced/ongoing HEP to improve outcomes and carryover.  Goal status: INITIAL  2.  Patient will report at least 75% improvement in R hip pain to improve QOL. Baseline: 4/10 pain Goal status: INITIAL  3.  Patient will demonstrate improved functional R  hip strength as demonstrated by 5/5 for hip extension and abduction. Baseline: 4-/5 Goal status: INITIAL  4.  Patient will be able to ambulate 500' with normal gait pattern and no pain  Baseline: pain with walking Goal status: INITIAL  5.  Patient will report 21 on FOTO (patient reported outcome measure) to demonstrate improved functional ability. Baseline: 41 Goal status: INITIAL   PLAN:  PT FREQUENCY: 2x/week  PT DURATION: 8 weeks  PLANNED INTERVENTIONS: Therapeutic exercises, Therapeutic activity, Neuromuscular re-education, Balance training, Gait training, Patient/Family education, Self Care, Joint mobilization, Stair training, Dry Needling, Electrical stimulation, Cryotherapy, Moist heat, Ultrasound, Ionotophoresis 4mg /ml Dexamethasone, and Manual therapy  PLAN FOR NEXT SESSION: pain management for bursitis and tendonosis, R hip strengthening    George Ina, SPTA 03/21/2023, 1:16 PM

## 2023-03-23 ENCOUNTER — Encounter: Payer: Self-pay | Admitting: Physical Therapy

## 2023-03-23 ENCOUNTER — Ambulatory Visit: Payer: BC Managed Care – PPO | Admitting: Physical Therapy

## 2023-03-23 DIAGNOSIS — R262 Difficulty in walking, not elsewhere classified: Secondary | ICD-10-CM | POA: Diagnosis not present

## 2023-03-23 DIAGNOSIS — M6281 Muscle weakness (generalized): Secondary | ICD-10-CM | POA: Diagnosis not present

## 2023-03-23 DIAGNOSIS — R278 Other lack of coordination: Secondary | ICD-10-CM | POA: Diagnosis not present

## 2023-03-23 DIAGNOSIS — M25551 Pain in right hip: Secondary | ICD-10-CM

## 2023-03-23 NOTE — Therapy (Signed)
OUTPATIENT PHYSICAL THERAPY LOWER EXTREMITY    Patient Name: Kimberly Cox MRN: [id number] DOB:09-May-1946, 77 y.o., female Today's Date: 03/23/2023  END OF SESSION:  PT End of Session - 03/23/23 1014     Visit Number 3    Date for PT Re-Evaluation 05/17/23    PT Start Time 1015    PT Stop Time 1100    PT Time Calculation (min) 45 min    Activity Tolerance Patient tolerated treatment well    Behavior During Therapy [facility] for tasks assessed/performed             Past Medical History:  Diagnosis Date   Anxiety    Anxiety disorder    Concussion Dec 13th, 2011   fell, striking her head against a wall, developed dizziness - UC eval c/w concussion. Had follow-up and did ok.    History of chest pain    Hyperlipidemia    Hypertension    Plantar fasciitis    Spinal stenosis, lumbar    Past Surgical History:  Procedure Laterality Date   ARTHRODESIS METATARSALPHALANGEAL JOINT (MTPJ) Right 10/08/2015   Procedure: RIGHT HALLUX METATARSAL PHALANGEAL JOINT  ARTHRODESIS;  Surgeon: Toni Arthurs, MD;  Location: [facility];  Service: Orthopedics;  Laterality: Right;   caesarean section     CESAREAN SECTION     HAMMERTOE RECONSTRUCTION WITH WEIL OSTEOTOMY Right 10/08/2015   Procedure: RIGHT SECOND METATARSAL WEIL HAMMERTOE CORRECTION;  Surgeon: Toni Arthurs, MD;  Location: [facility];  Service: Orthopedics;  Laterality: Right;   TONSILLECTOMY     Patient Active Problem List   Diagnosis Date Noted   Diuretic-induced hypokalemia 03/31/2020   Sciatica of right side 05/01/2018   Piriformis syndrome of left side 11/27/2017   GERD with esophagitis 08/22/2017   Asthmatic bronchitis 08/22/2017   Prediabetes 02/02/2017   Pure hyperglyceridemia 02/02/2017   Degenerative cervical disc 01/26/2016   Degenerative lumbar disc 01/26/2016   Spinal stenosis of lumbar region with radiculopathy 01/20/2016   Routine general medical examination at a health care facility  07/18/2014   Hereditary and idiopathic peripheral neuropathy 01/31/2008   Hyperlipidemia LDL goal <130 12/05/2007   Essential hypertension 12/05/2007    PCP: Sanda Linger  REFERRING PROVIDER: Richardean Canal  REFERRING DIAG: M25.551 (ICD-10-CM) - Pain of right hip   THERAPY DIAG:  Pain in right hip  Muscle weakness (generalized)  Difficulty in walking, not elsewhere classified  Rationale for Evaluation and Treatment: Rehabilitation  ONSET DATE: 03/03/23  SUBJECTIVE:   SUBJECTIVE STATEMENT: "I have a tear in my glut minimis" Thinks she may be improving.  PERTINENT HISTORY: Spinal stenosis at L3-L4 intra-articular injection gave her good relief for about a week. She is also had 2 trochanteric injections first 1 gave her some relief second 1 gave her no relief. Patient denies any groin pain. Pain mostly lateral aspect of the hip. No radicular symptoms down the leg.   PAIN:  Are you having pain? Yes: NPRS scale: 3/10 Pain location: R hip, starting to travel down the leg  Pain description: dull, ache Aggravating factors: walking, steps Relieving factors: medication   PRECAUTIONS: None  WEIGHT BEARING RESTRICTIONS: No  FALLS:  Has patient fallen in last 6 months? No  LIVING ENVIRONMENT: Lives with: lives with their spouse Lives in: House/apartment Stairs: No Has following equipment at home: Single point cane  OCCUPATION: Retired  PLOF: Independent  PATIENT GOALS: to get rid of the pain  NEXT MD VISIT: 03/20/23  OBJECTIVE:   DIAGNOSTIC  FINDINGS: MRI right hip without contrast dated 03/01/2023 is reviewed with the patient.  Images reviewed. This shows right gluteus minimus tendon with severe tendinosis.  There is also high-grade partial-thickness tear at the insertion.  Mild right gluteus medius tendinosis without tear.  Mild right hip osteoarthritis.  Trochanteric bursitis on the right is mild.  Mild edema is seen intermuscular portion of the proximal abductor  compartment muscular EXTR.  Right greater than left.  Right gluteus medius muscle edema also seen felt to reflect low-grade strains.   Impression: Right hip severe tendinosis right gluteus minimus tendon with high-grade partial-thickness tear at the insertion. Right hip osteoarthritis mild  PATIENT SURVEYS:  FOTO 41  COGNITION: Overall cognitive status: Within functional limits for tasks assessed     SENSATION: WFL  MUSCLE LENGTH: Tightness in BLE  PALPATION: She has tenderness over the right greater trochanteric region.   LOWER EXTREMITY ROM: WFL    LOWER EXTREMITY MMT:  MMT Right eval Left eval  Hip flexion 4+ 5  Hip extension 4-   Hip abduction 4-   Hip adduction    Hip internal rotation    Hip external rotation    Knee flexion 5 5  Knee extension 5 5  Ankle dorsiflexion    Ankle plantarflexion    Ankle inversion    Ankle eversion     (Blank rows = not tested)  FUNCTIONAL TESTS:  5 times sit to stand: 12.43s   TODAY'S TREATMENT:                                                                                                                              DATE:  03/23/23 NuStep L5 x 6 min Resisted Gait 20lb 4 way x 3 each Hip add ball squeeze 2x10 Hip abd blue 2x15 Leg press 20lb 2x10 Supine bridges x10 SLR x10 each Supine hip Abd x10 PROM ad stretching of LE's Ball squeeze & bridges 2x10    03/21/23 Seated abd/marches blue t-band 2x10 HS curls red t-band 2x10 R hip med/lat rot yellow t-band 2x10 Bridges 2x10 Trunk rotation 2x10 SLR R 2lb 2x10 LE PROM and stretching; emphasis on ITB Standing R hip ext and adduction red t-band, B abduction 2x10   03/15/23- EVAL    PATIENT EDUCATION:  Education details: HEP and POC Person educated: Patient Education method: Explanation Education comprehension: verbalized understanding  HOME EXERCISE PROGRAM: Access Code: [email or web address] URL: [email or web address] Date: 03/15/2023 Prepared by: Cassie Freer  Exercises - Supine Bridge  - 1 x daily - 7 x weekly - 2 sets - 10 reps - Beginner Side Leg Lift  - 1 x daily - 7 x weekly - 2 sets - 10 reps - Clamshell with Resistance  - 1 x daily - 7 x weekly - 2 sets - 10 reps - Supine Piriformis Stretch with Foot on Ground  - 1 x daily - 7 x weekly - 2 reps - 15-30 hold  ASSESSMENT:  CLINICAL IMPRESSION: Patient is a 77 y.o. female who was seen today for physical therapy treatment for R hip pain. Her images show right gluteus minimus severe tendinosis and a high-grade partial-thickness tear at the insertion. She also has mild right gluteus medius tendinosis without tear.  Mild right hip osteoarthritis and trochanteric bursitis on the right. She received shock treatment to her R hip that she will continue within the next few weeks for pain relief. Continued with hip strengthening with use of closed chain functional interventions. R hip weakness present with all directions of resisted gait. No pain reported with PROM or stretching. All interventions completed  She would benefit from continued PT to increase her strength and ease of walking.  OBJECTIVE IMPAIRMENTS: difficulty walking, decreased strength, and pain.   REHAB POTENTIAL: Good  CLINICAL DECISION MAKING: Stable/uncomplicated  EVALUATION COMPLEXITY: Low   GOALS: Goals reviewed with patient? Yes  SHORT TERM GOALS: Target date: 04/12/23  Patient will be independent with initial HEP. Goal status: INITIAL   LONG TERM GOALS: Target date: 05/17/23  Patient will be independent with advanced/ongoing HEP to improve outcomes and carryover.  Goal status: INITIAL  2.  Patient will report at least 75% improvement in R hip pain to improve QOL. Baseline: 4/10 pain /Goal status: Progressing 03/23/23  3.  Patient will demonstrate improved functional R hip strength as demonstrated by 5/5 for hip extension and abduction. Baseline: 4-/5 Goal status: INITIAL  4.  Patient will be able to ambulate  500' with normal gait pattern and no pain  Baseline: pain with walking Goal status: INITIAL  5.  Patient will report 55 on FOTO (patient reported outcome measure) to demonstrate improved functional ability. Baseline: 41 Goal status: INITIAL   PLAN:  PT FREQUENCY: 2x/week  PT DURATION: 8 weeks  PLANNED INTERVENTIONS: Therapeutic exercises, Therapeutic activity, Neuromuscular re-education, Balance training, Gait training, Patient/Family education, Self Care, Joint mobilization, Stair training, Dry Needling, Electrical stimulation, Cryotherapy, Moist heat, Ultrasound, Ionotophoresis 4mg /ml Dexamethasone, and Manual therapy  PLAN FOR NEXT SESSION: pain management for bursitis and tendonosis, R hip strengthening    Grayce Sessions, PTA, SPTA 03/23/2023, 10:15 AM

## 2023-03-29 ENCOUNTER — Encounter: Payer: Self-pay | Admitting: Sports Medicine

## 2023-03-29 ENCOUNTER — Ambulatory Visit (INDEPENDENT_AMBULATORY_CARE_PROVIDER_SITE_OTHER): Payer: BC Managed Care – PPO | Admitting: Sports Medicine

## 2023-03-29 DIAGNOSIS — M7061 Trochanteric bursitis, right hip: Secondary | ICD-10-CM | POA: Diagnosis not present

## 2023-03-29 DIAGNOSIS — M67951 Unspecified disorder of synovium and tendon, right thigh: Secondary | ICD-10-CM | POA: Diagnosis not present

## 2023-03-29 DIAGNOSIS — S76011D Strain of muscle, fascia and tendon of right hip, subsequent encounter: Secondary | ICD-10-CM | POA: Diagnosis not present

## 2023-03-29 NOTE — Progress Notes (Signed)
Kimberly Cox - 77 y.o. female MRN [id number]  Date of birth: 06-Feb-1946  Office Visit Note: Visit Date: 03/29/2023 PCP: Etta Grandchild, MD Referred by: Etta Grandchild, MD  Subjective: Chief Complaint  Patient presents with   Right Hip - Follow-up   HPI: Kimberly Cox is a pleasant 77 y.o. female who presents today for follow-up of chronic right lateral hip pain.  We did perform first session of extracorporeal shockwave therapy on 03/20/2023.  She does feel like she received some benefit from this.  Also performing formalized physical therapy and noting improvement.  Continues on Celebrex 100 mg twice daily.   Previous treatments: have included an intra-articular injection which gave her good relief for about a week. She is also had 2 trochanteric injections first 1 gave her some relief second 1 gave her no relief. Patient denies any groin pain. Seen by Dr. Magnus Ivan and Rexene Edison - referred over for further management and consideration of shockwave therapy.   Pertinent ROS were reviewed with the patient and found to be negative unless otherwise specified above in HPI.   Assessment & Plan: Visit Diagnoses:  1. Trochanteric bursitis, right hip   2. Tear of right gluteus minimus tendon, subsequent encounter   3. Tendinopathy of right gluteus medius    Plan: Harriett Sine did receive some improvement after the first session of extracorporeal shockwave therapy, we did repeat treatment today.  She will continue her formalized physical therapy and home rehab exercises to work on strengthening of the gluteal tendons.  Given her initial improvement, we will plan for additional sessions of ESWT spacing them out about 1 week apart.  May use Celebrex as needed. F/u in 1 week.  Follow-up: Return in about 1 week (around 04/05/2023) for right lateral hip.   Meds & Orders: No orders of the defined types were placed in this encounter.  No orders of the defined types were placed in this encounter.     Procedures: Procedure: ECSWT Indications:  Gluteus minimus/medius tendinopathy   Procedure Details Consent: Risks of procedure as well as the alternatives and risks of each were explained to the patient.  Verbal consent for procedure obtained. Time Out: Verified patient identification, verified procedure, site was marked, verified correct patient position. The area was cleaned with alcohol swab.     The right GT and gluteus tendons were targeted for Extracorporeal shockwave therapy.    Preset: Trochanteric bursitis/lateral hip pain Power Level: 120 mJ Frequency: 12 Hz Impulse/cycles: 2800 Head size: Regular   Patient tolerated procedure well without immediate complications.       Clinical History: No specialty comments available.  She reports that she has quit smoking. She has never used smokeless tobacco. No results for input(s): "HGBA1C", "LABURIC" in the last 8760 hours.  Objective:   Vital Signs: There were no vitals taken for this visit.  Physical Exam  Gen: Well-appearing, in no acute distress; non-toxic CV: Well-perfused. Warm.  Resp: Breathing unlabored on room air; no wheezing. Psych: Fluid speech in conversation; appropriate affect; normal thought process Neuro: Sensation intact throughout. No gross coordination deficits.   Ortho Exam - Right hip: + mild TTP over region of the greater trochanter and posterior aspect of buttock.  No mechanical blocks to internal or external rotation.  Imaging: MR Hip Right w/o contrast CLINICAL DATA:  Hip pain, chronic, articular cartilage eval, xray done assess cartilage, troch bursitis, assess tendon   EXAM: MR OF THE RIGHT HIP WITHOUT CONTRAST  TECHNIQUE: Multiplanar, multisequence MR imaging was performed. No intravenous contrast was administered.   COMPARISON:  X-ray 02/06/2023   FINDINGS: Bones: No acute fracture. No dislocation. No femoral head avascular necrosis. Bony pelvis intact without diastasis. SI  joints and pubic symphysis within normal limits. Degenerative lower lumbar spondylosis, not well assessed. No bone marrow edema. No marrow replacing bone lesion.   Articular cartilage and labrum   Articular cartilage: Mild chondral thinning. There is fissuring along the superior chondrolabral junction. No subchondral marrow edema.   Labrum:  Superior labral degeneration.  No paralabral cyst.   Joint or bursal effusion   Joint effusion:  None.   Bursae: Small-moderate volume right peritrochanteric bursal fluid.   Muscles and tendons   Muscles and tendons: Severe tendinosis of the right gluteus minimus tendon with high-grade partial-thickness insertional tearing. Mild right gluteus medius tendinosis without tear. Mild tendinosis of the contralateral left gluteal tendons. The hamstring, iliopsoas, rectus femoris, and adductor tendons appear intact without tear or significant tendinosis. Mild intramuscular edema within the proximal adductor compartment musculature, right greater than left. Mild intramuscular edema within the right gluteus medius muscle.   Other findings   Miscellaneous: No soft tissue edema or fluid collection. No inguinal lymphadenopathy.   IMPRESSION: 1. Severe tendinosis of the right gluteus minimus tendon with high-grade partial-thickness insertional tearing. Mild right gluteus medius tendinosis without tear. 2. Mild-moderate right peritrochanteric bursitis. 3. Mild intramuscular edema within the proximal adductor compartment musculature, right greater than left, and right gluteus medius muscle which may reflect low-grade strains. 4. Mild right hip osteoarthritis.   Electronically Signed   By: Duanne Guess D.O.   On: 03/01/2023 13:55  Past Medical/Family/Surgical/Social History: Medications & Allergies reviewed per EMR, new medications updated. Patient Active Problem List   Diagnosis Date Noted   Diuretic-induced hypokalemia 03/31/2020    Sciatica of right side 05/01/2018   Piriformis syndrome of left side 11/27/2017   GERD with esophagitis 08/22/2017   Asthmatic bronchitis 08/22/2017   Prediabetes 02/02/2017   Pure hyperglyceridemia 02/02/2017   Degenerative cervical disc 01/26/2016   Degenerative lumbar disc 01/26/2016   Spinal stenosis of lumbar region with radiculopathy 01/20/2016   Routine general medical examination at a health care facility 07/18/2014   Hereditary and idiopathic peripheral neuropathy 01/31/2008   Hyperlipidemia LDL goal <130 12/05/2007   Essential hypertension 12/05/2007   Past Medical History:  Diagnosis Date   Anxiety    Anxiety disorder    Concussion Dec 13th, 2011   fell, striking her head against a wall, developed dizziness - UC eval c/w concussion. Had follow-up and did ok.    History of chest pain    Hyperlipidemia    Hypertension    Plantar fasciitis    Spinal stenosis, lumbar    Family History  Problem Relation Age of Onset   Cancer Mother    Coronary artery disease Father    Past Surgical History:  Procedure Laterality Date   ARTHRODESIS METATARSALPHALANGEAL JOINT (MTPJ) Right 10/08/2015   Procedure: RIGHT HALLUX METATARSAL PHALANGEAL JOINT  ARTHRODESIS;  Surgeon: Toni Arthurs, MD;  Location: [facility];  Service: Orthopedics;  Laterality: Right;   caesarean section     CESAREAN SECTION     HAMMERTOE RECONSTRUCTION WITH WEIL OSTEOTOMY Right 10/08/2015   Procedure: RIGHT SECOND METATARSAL WEIL HAMMERTOE CORRECTION;  Surgeon: Toni Arthurs, MD;  Location:  [facility];  Service: Orthopedics;  Laterality: Right;   TONSILLECTOMY     Social History   Occupational History  Not on file  Tobacco Use   Smoking status: Former   Smokeless tobacco: Never   Tobacco comments:    only in h.s.  Vaping Use   Vaping status: Never Used  Substance and Sexual Activity   Alcohol use: Yes    Comment: occ   Drug use: No   Sexual activity: Not Currently    Birth  control/protection: Post-menopausal    Comment: 1st intercourse- 20, partners- 1

## 2023-04-01 ENCOUNTER — Other Ambulatory Visit: Payer: Self-pay | Admitting: Orthopaedic Surgery

## 2023-04-03 NOTE — Therapy (Signed)
OUTPATIENT PHYSICAL THERAPY LOWER EXTREMITY    Patient Name: Kimberly Cox MRN: [id number] DOB:[birth year], 77 y.o., female Today's Date: 04/04/2023  END OF SESSION:  PT End of Session - 04/04/23 0925     Visit Number 4    Date for PT Re-Evaluation 05/17/23    PT Start Time 0925    PT Stop Time 1010    PT Time Calculation (min) 45 min    Activity Tolerance Patient tolerated treatment well    Behavior During Therapy [facility] for tasks assessed/performed              Past Medical History:  Diagnosis Date   Anxiety    Anxiety disorder    Concussion Dec 13th, 2011   fell, striking her head against a wall, developed dizziness - UC eval c/w concussion. Had follow-up and did ok.    History of chest pain    Hyperlipidemia    Hypertension    Plantar fasciitis    Spinal stenosis, lumbar    Past Surgical History:  Procedure Laterality Date   ARTHRODESIS METATARSALPHALANGEAL JOINT (MTPJ) Right 10/08/2015   Procedure: RIGHT HALLUX METATARSAL PHALANGEAL JOINT  ARTHRODESIS;  Surgeon: Toni Arthurs, MD;  Location: [facility];  Service: Orthopedics;  Laterality: Right;   caesarean section     CESAREAN SECTION     HAMMERTOE RECONSTRUCTION WITH WEIL OSTEOTOMY Right 10/08/2015   Procedure: RIGHT SECOND METATARSAL WEIL HAMMERTOE CORRECTION;  Surgeon: Toni Arthurs, MD;  Location: [facility];  Service: Orthopedics;  Laterality: Right;   TONSILLECTOMY     Patient Active Problem List   Diagnosis Date Noted   Diuretic-induced hypokalemia 03/31/2020   Sciatica of right side 05/01/2018   Piriformis syndrome of left side 11/27/2017   GERD with esophagitis 08/22/2017   Asthmatic bronchitis 08/22/2017   Prediabetes 02/02/2017   Pure hyperglyceridemia 02/02/2017   Degenerative cervical disc 01/26/2016   Degenerative lumbar disc 01/26/2016   Spinal stenosis of lumbar region with radiculopathy 01/20/2016   Routine general medical examination at a health care facility  07/18/2014   Hereditary and idiopathic peripheral neuropathy 01/31/2008   Hyperlipidemia LDL goal <130 12/05/2007   Essential hypertension 12/05/2007    PCP: Sanda Linger  REFERRING PROVIDER: Richardean Canal  REFERRING DIAG: M25.551 (ICD-10-CM) - Pain of right hip   THERAPY DIAG:  Pain in right hip  Difficulty in walking, not elsewhere classified  Other lack of coordination  Muscle weakness (generalized)  Rationale for Evaluation and Treatment: Rehabilitation  ONSET DATE: 03/03/23  SUBJECTIVE:   SUBJECTIVE STATEMENT: I think I am getting better between this and the shockwave therapy. I have noticed less of a limp with walking.   PERTINENT HISTORY: Spinal stenosis at L3-L4 intra-articular injection gave her good relief for about a week. She is also had 2 trochanteric injections first 1 gave her some relief second 1 gave her no relief. Patient denies any groin pain. Pain mostly lateral aspect of the hip. No radicular symptoms down the leg.   PAIN:  Are you having pain? Yes: NPRS scale: 3/10 Pain location: R hip, starting to travel down the leg  Pain description: dull, ache Aggravating factors: walking, steps Relieving factors: medication   PRECAUTIONS: None  WEIGHT BEARING RESTRICTIONS: No  FALLS:  Has patient fallen in last 6 months? No  LIVING ENVIRONMENT: Lives with: lives with their spouse Lives in: House/apartment Stairs: No Has following equipment at home: Single point cane  OCCUPATION: Retired  PLOF: Independent  PATIENT GOALS: to  get rid of the pain  NEXT MD VISIT: 03/20/23  OBJECTIVE:   DIAGNOSTIC FINDINGS: MRI right hip without contrast dated 03/01/2023 is reviewed with the patient.  Images reviewed. This shows right gluteus minimus tendon with severe tendinosis.  There is also high-grade partial-thickness tear at the insertion.  Mild right gluteus medius tendinosis without tear.  Mild right hip osteoarthritis.  Trochanteric bursitis on the right is  mild.  Mild edema is seen intermuscular portion of the proximal abductor compartment muscular EXTR.  Right greater than left.  Right gluteus medius muscle edema also seen felt to reflect low-grade strains.   Impression: Right hip severe tendinosis right gluteus minimus tendon with high-grade partial-thickness tear at the insertion. Right hip osteoarthritis mild  PATIENT SURVEYS:  FOTO 41  COGNITION: Overall cognitive status: Within functional limits for tasks assessed     SENSATION: WFL  MUSCLE LENGTH: Tightness in BLE  PALPATION: She has tenderness over the right greater trochanteric region.   LOWER EXTREMITY ROM: WFL    LOWER EXTREMITY MMT:  MMT Right eval Left eval  Hip flexion 4+ 5  Hip extension 4-   Hip abduction 4-   Hip adduction    Hip internal rotation    Hip external rotation    Knee flexion 5 5  Knee extension 5 5  Ankle dorsiflexion    Ankle plantarflexion    Ankle inversion    Ankle eversion     (Blank rows = not tested)  FUNCTIONAL TESTS:  5 times sit to stand: 12.43s   TODAY'S TREATMENT:                                                                                                                              DATE:  04/04/23 NuStep L5 x41mins  Bridges 2x10 SLR 2x10  RLE SL hip abd 2x10 RLE  Clamshells green 2x10  Stretching glutes, piriformis, and HS 30s each side  Leg ext 10# 2x10  HS curls 20# 2x10  Leg press 20# 2x10 Lateral band walks green  03/23/23 NuStep L5 x 6 min Resisted Gait 20lb 4 way x 3 each Hip add ball squeeze 2x10 Hip abd blue 2x15 Leg press 20lb 2x10 Supine bridges x10 SLR x10 each Supine hip Abd x10 PROM ad stretching of LE's Ball squeeze & bridges 2x10    03/21/23 Seated abd/marches blue t-band 2x10 HS curls red t-band 2x10 R hip med/lat rot yellow t-band 2x10 Bridges 2x10 Trunk rotation 2x10 SLR R 2lb 2x10 LE PROM and stretching; emphasis on ITB Standing R hip ext and adduction red t-band, B abduction  2x10   03/15/23- EVAL    PATIENT EDUCATION:  Education details: HEP and POC Person educated: Patient Education method: Explanation Education comprehension: verbalized understanding  HOME EXERCISE PROGRAM: Access Code: [email or web address] URL: [email or web address] Date: 03/15/2023 Prepared by: Cassie Freer  Exercises - Supine Bridge  - 1 x daily - 7 x weekly - 2 sets - 10 reps -  Beginner Side Leg Lift  - 1 x daily - 7 x weekly - 2 sets - 10 reps - Clamshell with Resistance  - 1 x daily - 7 x weekly - 2 sets - 10 reps - Supine Piriformis Stretch with Foot on Ground  - 1 x daily - 7 x weekly - 2 reps - 15-30 hold  ASSESSMENT:  CLINICAL IMPRESSION: Patient is a 77 y.o. female who was seen today for physical therapy treatment for R hip pain. We focused on mostly hip and glute strengthening today to target her right gluteus medius and minimus tendinosis. All interventions completed w/o pain, some difficulty with lateral band walks. She would benefit from continued PT to increase her strength and normalize her gait pattern.   OBJECTIVE IMPAIRMENTS: difficulty walking, decreased strength, and pain.   REHAB POTENTIAL: Good  CLINICAL DECISION MAKING: Stable/uncomplicated  EVALUATION COMPLEXITY: Low   GOALS: Goals reviewed with patient? Yes  SHORT TERM GOALS: Target date: 04/12/23  Patient will be independent with initial HEP. Goal status: INITIAL   LONG TERM GOALS: Target date: 05/17/23  Patient will be independent with advanced/ongoing HEP to improve outcomes and carryover.  Goal status: INITIAL  2.  Patient will report at least 75% improvement in R hip pain to improve QOL. Baseline: 4/10 pain /Goal status: Progressing 03/23/23  3.  Patient will demonstrate improved functional R hip strength as demonstrated by 5/5 for hip extension and abduction. Baseline: 4-/5 Goal status: INITIAL  4.  Patient will be able to ambulate 500' with normal gait pattern and no pain   Baseline: pain with walking Goal status: INITIAL  5.  Patient will report 46 on FOTO (patient reported outcome measure) to demonstrate improved functional ability. Baseline: 41 Goal status: INITIAL   PLAN:  PT FREQUENCY: 2x/week  PT DURATION: 8 weeks  PLANNED INTERVENTIONS: Therapeutic exercises, Therapeutic activity, Neuromuscular re-education, Balance training, Gait training, Patient/Family education, Self Care, Joint mobilization, Stair training, Dry Needling, Electrical stimulation, Cryotherapy, Moist heat, Ultrasound, Ionotophoresis 4mg /ml Dexamethasone, and Manual therapy  PLAN FOR NEXT SESSION: pain management for bursitis and tendonosis, R hip strengthening    Cassie Freer, PT, SPTA 04/04/2023, 10:05 AM

## 2023-04-04 ENCOUNTER — Ambulatory Visit: Payer: BC Managed Care – PPO

## 2023-04-04 DIAGNOSIS — M25551 Pain in right hip: Secondary | ICD-10-CM

## 2023-04-04 DIAGNOSIS — M6281 Muscle weakness (generalized): Secondary | ICD-10-CM | POA: Diagnosis not present

## 2023-04-04 DIAGNOSIS — R278 Other lack of coordination: Secondary | ICD-10-CM

## 2023-04-04 DIAGNOSIS — R262 Difficulty in walking, not elsewhere classified: Secondary | ICD-10-CM | POA: Diagnosis not present

## 2023-04-04 NOTE — Therapy (Signed)
OUTPATIENT PHYSICAL THERAPY LOWER EXTREMITY    Patient Name: Kimberly Cox MRN: [id number] DOB:[birth year], 77 y.o., female Today's Date: 04/06/2023  END OF SESSION:  PT End of Session - 04/06/23 0930     Visit Number 5    Date for PT Re-Evaluation 05/17/23    PT Start Time 0930    PT Stop Time 1015    PT Time Calculation (min) 45 min    Activity Tolerance Patient tolerated treatment well    Behavior During Therapy [facility] for tasks assessed/performed               Past Medical History:  Diagnosis Date   Anxiety    Anxiety disorder    Concussion Dec 13th, 2011   fell, striking her head against a wall, developed dizziness - UC eval c/w concussion. Had follow-up and did ok.    History of chest pain    Hyperlipidemia    Hypertension    Plantar fasciitis    Spinal stenosis, lumbar    Past Surgical History:  Procedure Laterality Date   ARTHRODESIS METATARSALPHALANGEAL JOINT (MTPJ) Right 10/08/2015   Procedure: RIGHT HALLUX METATARSAL PHALANGEAL JOINT  ARTHRODESIS;  Surgeon: Toni Arthurs, MD;  Location: [facility];  Service: Orthopedics;  Laterality: Right;   caesarean section     CESAREAN SECTION     HAMMERTOE RECONSTRUCTION WITH WEIL OSTEOTOMY Right 10/08/2015   Procedure: RIGHT SECOND METATARSAL WEIL HAMMERTOE CORRECTION;  Surgeon: Toni Arthurs, MD;  Location: [facility];  Service: Orthopedics;  Laterality: Right;   TONSILLECTOMY     Patient Active Problem List   Diagnosis Date Noted   Diuretic-induced hypokalemia 03/31/2020   Sciatica of right side 05/01/2018   Piriformis syndrome of left side 11/27/2017   GERD with esophagitis 08/22/2017   Asthmatic bronchitis 08/22/2017   Prediabetes 02/02/2017   Pure hyperglyceridemia 02/02/2017   Degenerative cervical disc 01/26/2016   Degenerative lumbar disc 01/26/2016   Spinal stenosis of lumbar region with radiculopathy 01/20/2016   Routine general medical examination at a health care facility  07/18/2014   Hereditary and idiopathic peripheral neuropathy 01/31/2008   Hyperlipidemia LDL goal <130 12/05/2007   Essential hypertension 12/05/2007    PCP: Sanda Linger  REFERRING PROVIDER: Richardean Canal  REFERRING DIAG: M25.551 (ICD-10-CM) - Pain of right hip   THERAPY DIAG:  Pain in right hip  Difficulty in walking, not elsewhere classified  Other lack of coordination  Muscle weakness (generalized)  Rationale for Evaluation and Treatment: Rehabilitation  ONSET DATE: 03/03/23  SUBJECTIVE:   SUBJECTIVE STATEMENT: My hip is okay, my thigh is hurting today. It almost seems like it is the bone.   PERTINENT HISTORY: Spinal stenosis at L3-L4 intra-articular injection gave her good relief for about a week. She is also had 2 trochanteric injections first 1 gave her some relief second 1 gave her no relief. Patient denies any groin pain. Pain mostly lateral aspect of the hip. No radicular symptoms down the leg.   PAIN:  Are you having pain? Yes: NPRS scale: 3/10 Pain location: R hip, starting to travel down the leg  Pain description: dull, ache Aggravating factors: walking, steps Relieving factors: medication   PRECAUTIONS: None  WEIGHT BEARING RESTRICTIONS: No  FALLS:  Has patient fallen in last 6 months? No  LIVING ENVIRONMENT: Lives with: lives with their spouse Lives in: House/apartment Stairs: No Has following equipment at home: Single point cane  OCCUPATION: Retired  PLOF: Independent  PATIENT GOALS: to get rid of  the pain  NEXT MD VISIT: 03/20/23  OBJECTIVE:   DIAGNOSTIC FINDINGS: MRI right hip without contrast dated 03/01/2023 is reviewed with the patient.  Images reviewed. This shows right gluteus minimus tendon with severe tendinosis.  There is also high-grade partial-thickness tear at the insertion.  Mild right gluteus medius tendinosis without tear.  Mild right hip osteoarthritis.  Trochanteric bursitis on the right is mild.  Mild edema is seen  intermuscular portion of the proximal abductor compartment muscular EXTR.  Right greater than left.  Right gluteus medius muscle edema also seen felt to reflect low-grade strains.   Impression: Right hip severe tendinosis right gluteus minimus tendon with high-grade partial-thickness tear at the insertion. Right hip osteoarthritis mild  PATIENT SURVEYS:  FOTO 41  COGNITION: Overall cognitive status: Within functional limits for tasks assessed     SENSATION: WFL  MUSCLE LENGTH: Tightness in BLE  PALPATION: She has tenderness over the right greater trochanteric region.   LOWER EXTREMITY ROM: WFL    LOWER EXTREMITY MMT:  MMT Right eval Left eval  Hip flexion 4+ 5  Hip extension 4-   Hip abduction 4-   Hip adduction    Hip internal rotation    Hip external rotation    Knee flexion 5 5  Knee extension 5 5  Ankle dorsiflexion    Ankle plantarflexion    Ankle inversion    Ankle eversion     (Blank rows = not tested)  FUNCTIONAL TESTS:  5 times sit to stand: 12.43s   TODAY'S TREATMENT:                                                                                                                              DATE:  04/06/23 NuStep L5x67mins  Step ups, lateral step ups 6"   Resisted gait 20# 4 way x4 STS with yellow ball 2x10 Hip abd blue seated 2x10 Ball squeeze 2x10  3 way hip with 3# 2x10 IT band and HS stretch 30s    04/04/23 NuStep L5 x33mins  Bridges 2x10 SLR 2x10  RLE SL hip abd 2x10 RLE  Clamshells green 2x10  Stretching glutes, piriformis, and HS 30s each side  Leg ext 10# 2x10  HS curls 20# 2x10  Leg press 20# 2x10 Lateral band walks green  03/23/23 NuStep L5 x 6 min Resisted Gait 20lb 4 way x 3 each Hip add ball squeeze 2x10 Hip abd blue 2x15 Leg press 20lb 2x10 Supine bridges x10 SLR x10 each Supine hip Abd x10 PROM ad stretching of LE's Ball squeeze & bridges 2x10    03/21/23 Seated abd/marches blue t-band 2x10 HS curls red t-band  2x10 R hip med/lat rot yellow t-band 2x10 Bridges 2x10 Trunk rotation 2x10 SLR R 2lb 2x10 LE PROM and stretching; emphasis on ITB Standing R hip ext and adduction red t-band, B abduction 2x10   03/15/23- EVAL    PATIENT EDUCATION:  Education details: HEP and POC Person educated:  Patient Education method: Explanation Education comprehension: verbalized understanding  HOME EXERCISE PROGRAM: Access Code: [email or web address] URL: [email or web address] Date: 03/15/2023 Prepared by: Cassie Freer  Exercises - Supine Bridge  - 1 x daily - 7 x weekly - 2 sets - 10 reps - Beginner Side Leg Lift  - 1 x daily - 7 x weekly - 2 sets - 10 reps - Clamshell with Resistance  - 1 x daily - 7 x weekly - 2 sets - 10 reps - Supine Piriformis Stretch with Foot on Ground  - 1 x daily - 7 x weekly - 2 reps - 15-30 hold  ASSESSMENT:  CLINICAL IMPRESSION: Patient was seen today for physical therapy treatment for R hip pain. We focused on mostly functional and isolated hip strengthening today. Some pain with 3 way hip exercise when having to do single leg on her RLE. She would benefit from continued PT to increase her strength and normalize her gait pattern.   OBJECTIVE IMPAIRMENTS: difficulty walking, decreased strength, and pain.   REHAB POTENTIAL: Good  CLINICAL DECISION MAKING: Stable/uncomplicated  EVALUATION COMPLEXITY: Low   GOALS: Goals reviewed with patient? Yes  SHORT TERM GOALS: Target date: 04/12/23  Patient will be independent with initial HEP. Goal status: INITIAL   LONG TERM GOALS: Target date: 05/17/23  Patient will be independent with advanced/ongoing HEP to improve outcomes and carryover.  Goal status: INITIAL  2.  Patient will report at least 75% improvement in R hip pain to improve QOL. Baseline: 4/10 pain /Goal status: Progressing 03/23/23  3.  Patient will demonstrate improved functional R hip strength as demonstrated by 5/5 for hip extension and  abduction. Baseline: 4-/5 Goal status: INITIAL  4.  Patient will be able to ambulate 500' with normal gait pattern and no pain  Baseline: pain with walking Goal status: INITIAL  5.  Patient will report 75 on FOTO (patient reported outcome measure) to demonstrate improved functional ability. Baseline: 41 Goal status: INITIAL   PLAN:  PT FREQUENCY: 2x/week  PT DURATION: 8 weeks  PLANNED INTERVENTIONS: Therapeutic exercises, Therapeutic activity, Neuromuscular re-education, Balance training, Gait training, Patient/Family education, Self Care, Joint mobilization, Stair training, Dry Needling, Electrical stimulation, Cryotherapy, Moist heat, Ultrasound, Ionotophoresis 4mg /ml Dexamethasone, and Manual therapy  PLAN FOR NEXT SESSION: pain management for bursitis and tendonosis, R hip strengthening    Cassie Freer, PT, SPTA 04/06/2023, 10:13 AM

## 2023-04-05 ENCOUNTER — Ambulatory Visit (INDEPENDENT_AMBULATORY_CARE_PROVIDER_SITE_OTHER): Payer: BC Managed Care – PPO | Admitting: Sports Medicine

## 2023-04-05 ENCOUNTER — Encounter: Payer: Self-pay | Admitting: Sports Medicine

## 2023-04-05 DIAGNOSIS — S76011D Strain of muscle, fascia and tendon of right hip, subsequent encounter: Secondary | ICD-10-CM | POA: Diagnosis not present

## 2023-04-05 DIAGNOSIS — M7061 Trochanteric bursitis, right hip: Secondary | ICD-10-CM | POA: Diagnosis not present

## 2023-04-05 DIAGNOSIS — M67951 Unspecified disorder of synovium and tendon, right thigh: Secondary | ICD-10-CM

## 2023-04-05 NOTE — Progress Notes (Signed)
Kimberly Cox - 77 y.o. female MRN [id number]  Date of birth: Jul 13, 1946  Office Visit Note: Visit Date: 04/05/2023 PCP: Etta Grandchild, MD Referred by: Etta Grandchild, MD  Subjective: Chief Complaint  Patient presents with   Right Hip - Follow-up   HPI: Kimberly Cox is a pleasant 77 y.o. female who presents today for follow-up of right chronic lateral hip pain.  She has completed 2 sessions of extracorporeal shockwave therapy, does feel like she is finding benefit from this.  Also continues with formalized physical therapy.  She is on Celebrex 100 mg twice daily as needed.  Has noticed that she has been able to walk shorter distances without limping and without pain, does note this is a vast improvement from prior.  Pertinent ROS were reviewed with the patient and found to be negative unless otherwise specified above in HPI.   Assessment & Plan: Visit Diagnoses:  1. Trochanteric bursitis, right hip   2. Tear of right gluteus minimus tendon, subsequent encounter   3. Tendinopathy of right gluteus medius    Plan: Caterine has received benefit from extracorporeal shockwave therapy as well as her formalized physical therapy.  We did repeat ESWT treatment today, she will plan for additional treatments about 1 week apart.  She may continue to use her Celebrex 100 mg twice daily as needed.  We did discuss the role for PRP injection therapy, may proceed with this at any point during her treatments but as she is finding benefit from shockwave, we will continue for now.  Follow-up in 1 week.  Follow-up: Return in about 1 week (around 04/12/2023) for for lat hip pain.   Meds & Orders: No orders of the defined types were placed in this encounter.  No orders of the defined types were placed in this encounter.    Procedures: Procedure: ECSWT Indications:  Gluteus minimus/medius tendinopathy   Procedure Details Consent: Risks of procedure as well as the alternatives and risks of each were  explained to the patient.  Verbal consent for procedure obtained. Time Out: Verified patient identification, verified procedure, site was marked, verified correct patient position. The area was cleaned with alcohol swab.     The right GT and gluteus tendons were targeted for Extracorporeal shockwave therapy.    Preset: Trochanteric bursitis/lateral hip pain Power Level: 120 mJ Frequency: 12 Hz Impulse/cycles: 2800 Head size: Regular   Patient tolerated procedure well without immediate complications.      Clinical History: No specialty comments available.  She reports that she has quit smoking. She has never used smokeless tobacco. No results for input(s): "HGBA1C", "LABURIC" in the last 8760 hours.  Objective:   Vital Signs: There were no vitals taken for this visit.  Physical Exam  Gen: Well-appearing, in no acute distress; non-toxic CV: Well-perfused. Warm.  Resp: Breathing unlabored on room air; no wheezing. Psych: Fluid speech in conversation; appropriate affect; normal thought process Neuro: Sensation intact throughout. No gross coordination deficits.   Ortho Exam - Right hip: + Mild TTP over the posterior aspect of the greater trochanter but certainly improved from previous visits.  No mechanical blocks to internal or external rotation.  Still some weakness with resisted hip abduction but improved from prior visits.  Imaging: No results found.  Past Medical/Family/Surgical/Social History: Medications & Allergies reviewed per EMR, new medications updated. Patient Active Problem List   Diagnosis Date Noted   Diuretic-induced hypokalemia 03/31/2020   Sciatica of right side 05/01/2018   Piriformis syndrome  of left side 11/27/2017   GERD with esophagitis 08/22/2017   Asthmatic bronchitis 08/22/2017   Prediabetes 02/02/2017   Pure hyperglyceridemia 02/02/2017   Degenerative cervical disc 01/26/2016   Degenerative lumbar disc 01/26/2016   Spinal stenosis of lumbar region  with radiculopathy 01/20/2016   Routine general medical examination at a health care facility 07/18/2014   Hereditary and idiopathic peripheral neuropathy 01/31/2008   Hyperlipidemia LDL goal <130 12/05/2007   Essential hypertension 12/05/2007   Past Medical History:  Diagnosis Date   Anxiety    Anxiety disorder    Concussion Dec 13th, 2011   fell, striking her head against a wall, developed dizziness - UC eval c/w concussion. Had follow-up and did ok.    History of chest pain    Hyperlipidemia    Hypertension    Plantar fasciitis    Spinal stenosis, lumbar    Family History  Problem Relation Age of Onset   Cancer Mother    Coronary artery disease Father    Past Surgical History:  Procedure Laterality Date   ARTHRODESIS METATARSALPHALANGEAL JOINT (MTPJ) Right 10/08/2015   Procedure: RIGHT HALLUX METATARSAL PHALANGEAL JOINT  ARTHRODESIS;  Surgeon: Toni Arthurs, MD;  Location: [facility];  Service: Orthopedics;  Laterality: Right;   caesarean section     CESAREAN SECTION     HAMMERTOE RECONSTRUCTION WITH WEIL OSTEOTOMY Right 10/08/2015   Procedure: RIGHT SECOND METATARSAL WEIL HAMMERTOE CORRECTION;  Surgeon: Toni Arthurs, MD;  Location: [facility];  Service: Orthopedics;  Laterality: Right;   TONSILLECTOMY     Social History   Occupational History   Not on file  Tobacco Use   Smoking status: Former   Smokeless tobacco: Never   Tobacco comments:    only in h.s.  Vaping Use   Vaping status: Never Used  Substance and Sexual Activity   Alcohol use: Yes    Comment: occ   Drug use: No   Sexual activity: Not Currently    Birth control/protection: Post-menopausal    Comment: 1st intercourse- 20, partners- 1

## 2023-04-06 ENCOUNTER — Ambulatory Visit: Payer: BC Managed Care – PPO | Attending: Internal Medicine

## 2023-04-06 DIAGNOSIS — M25551 Pain in right hip: Secondary | ICD-10-CM | POA: Diagnosis not present

## 2023-04-06 DIAGNOSIS — R278 Other lack of coordination: Secondary | ICD-10-CM | POA: Diagnosis not present

## 2023-04-06 DIAGNOSIS — R262 Difficulty in walking, not elsewhere classified: Secondary | ICD-10-CM | POA: Insufficient documentation

## 2023-04-06 DIAGNOSIS — M6281 Muscle weakness (generalized): Secondary | ICD-10-CM | POA: Diagnosis not present

## 2023-04-10 ENCOUNTER — Encounter: Payer: Self-pay | Admitting: Physical Therapy

## 2023-04-10 ENCOUNTER — Ambulatory Visit: Payer: BC Managed Care – PPO | Admitting: Physical Therapy

## 2023-04-10 DIAGNOSIS — R278 Other lack of coordination: Secondary | ICD-10-CM

## 2023-04-10 DIAGNOSIS — M25551 Pain in right hip: Secondary | ICD-10-CM

## 2023-04-10 DIAGNOSIS — R262 Difficulty in walking, not elsewhere classified: Secondary | ICD-10-CM | POA: Diagnosis not present

## 2023-04-10 DIAGNOSIS — M6281 Muscle weakness (generalized): Secondary | ICD-10-CM | POA: Diagnosis not present

## 2023-04-10 NOTE — Therapy (Signed)
OUTPATIENT PHYSICAL THERAPY LOWER EXTREMITY    Patient Name: Kimberly Cox MRN: [id number] DOB:1945-12-06, 77 y.o., female Today's Date: 04/10/2023  END OF SESSION:  PT End of Session - 04/10/23 0930     Visit Number 6    Date for PT Re-Evaluation 05/17/23    PT Start Time 0930    PT Stop Time 1015    PT Time Calculation (min) 45 min    Activity Tolerance Patient tolerated treatment well    Behavior During Therapy [facility] for tasks assessed/performed               Past Medical History:  Diagnosis Date   Anxiety    Anxiety disorder    Concussion Dec 13th, 2011   fell, striking her head against a wall, developed dizziness - UC eval c/w concussion. Had follow-up and did ok.    History of chest pain    Hyperlipidemia    Hypertension    Plantar fasciitis    Spinal stenosis, lumbar    Past Surgical History:  Procedure Laterality Date   ARTHRODESIS METATARSALPHALANGEAL JOINT (MTPJ) Right 10/08/2015   Procedure: RIGHT HALLUX METATARSAL PHALANGEAL JOINT  ARTHRODESIS;  Surgeon: Toni Arthurs, MD;  Location: [facility];  Service: Orthopedics;  Laterality: Right;   caesarean section     CESAREAN SECTION     HAMMERTOE RECONSTRUCTION WITH WEIL OSTEOTOMY Right 10/08/2015   Procedure: RIGHT SECOND METATARSAL WEIL HAMMERTOE CORRECTION;  Surgeon: Toni Arthurs, MD;  Location: [facility];  Service: Orthopedics;  Laterality: Right;   TONSILLECTOMY     Patient Active Problem List   Diagnosis Date Noted   Diuretic-induced hypokalemia 03/31/2020   Sciatica of right side 05/01/2018   Piriformis syndrome of left side 11/27/2017   GERD with esophagitis 08/22/2017   Asthmatic bronchitis 08/22/2017   Prediabetes 02/02/2017   Pure hyperglyceridemia 02/02/2017   Degenerative cervical disc 01/26/2016   Degenerative lumbar disc 01/26/2016   Spinal stenosis of lumbar region with radiculopathy 01/20/2016   Routine general medical examination at a health care facility  07/18/2014   Hereditary and idiopathic peripheral neuropathy 01/31/2008   Hyperlipidemia LDL goal <130 12/05/2007   Essential hypertension 12/05/2007    PCP: Sanda Linger  REFERRING PROVIDER: Richardean Canal  REFERRING DIAG: M25.551 (ICD-10-CM) - Pain of right hip   THERAPY DIAG:  Pain in right hip  Difficulty in walking, not elsewhere classified  Other lack of coordination  Muscle weakness (generalized)  Rationale for Evaluation and Treatment: Rehabilitation  ONSET DATE: 03/03/23  SUBJECTIVE:   SUBJECTIVE STATEMENT: "I think things are a little better" some soreness on the lower part of lateral hips.   PERTINENT HISTORY: Spinal stenosis at L3-L4 intra-articular injection gave her good relief for about a week. She is also had 2 trochanteric injections first 1 gave her some relief second 1 gave her no relief. Patient denies any groin pain. Pain mostly lateral aspect of the hip. No radicular symptoms down the leg.   PAIN:  Are you having pain? Yes: NPRS scale: 4/10 Pain location: R hip, starting to travel down the leg  Pain description: dull, ache Aggravating factors: walking, steps Relieving factors: medication   PRECAUTIONS: None  WEIGHT BEARING RESTRICTIONS: No  FALLS:  Has patient fallen in last 6 months? No  LIVING ENVIRONMENT: Lives with: lives with their spouse Lives in: House/apartment Stairs: No Has following equipment at home: Single point cane  OCCUPATION: Retired  PLOF: Independent  PATIENT GOALS: to get rid of the  pain  NEXT MD VISIT: 03/20/23  OBJECTIVE:   DIAGNOSTIC FINDINGS: MRI right hip without contrast dated 03/01/2023 is reviewed with the patient.  Images reviewed. This shows right gluteus minimus tendon with severe tendinosis.  There is also high-grade partial-thickness tear at the insertion.  Mild right gluteus medius tendinosis without tear.  Mild right hip osteoarthritis.  Trochanteric bursitis on the right is mild.  Mild edema is seen  intermuscular portion of the proximal abductor compartment muscular EXTR.  Right greater than left.  Right gluteus medius muscle edema also seen felt to reflect low-grade strains.   Impression: Right hip severe tendinosis right gluteus minimus tendon with high-grade partial-thickness tear at the insertion. Right hip osteoarthritis mild  PATIENT SURVEYS:  FOTO 41  COGNITION: Overall cognitive status: Within functional limits for tasks assessed     SENSATION: WFL  MUSCLE LENGTH: Tightness in BLE  PALPATION: She has tenderness over the right greater trochanteric region.   LOWER EXTREMITY ROM: WFL    LOWER EXTREMITY MMT:  MMT Right eval Left eval  Hip flexion 4+ 5  Hip extension 4-   Hip abduction 4-   Hip adduction    Hip internal rotation    Hip external rotation    Knee flexion 5 5  Knee extension 5 5  Ankle dorsiflexion    Ankle plantarflexion    Ankle inversion    Ankle eversion     (Blank rows = not tested)  FUNCTIONAL TESTS:  5 times sit to stand: 12.43s   TODAY'S TREATMENT:                                                                                                                              DATE:  04/10/23 Bike L 3 x 6 min 4in box on airex lateral step ups  Resisted gait 30# 4 way x4 HS curls 25lb 2x10 S2S holding blue ball 2x10 Hip Abd green then bridg 2x10  Bilat hip strtching all directions  04/06/23 NuStep L5x50mins  Step ups, lateral step ups 6"   Resisted gait 20# 4 way x4 STS with yellow ball 2x10 Hip abd blue seated 2x10 Ball squeeze 2x10  3 way hip with 3# 2x10 IT band and HS stretch 30s    04/04/23 NuStep L5 x5mins  Bridges 2x10 SLR 2x10  RLE SL hip abd 2x10 RLE  Clamshells green 2x10  Stretching glutes, piriformis, and HS 30s each side  Leg ext 10# 2x10  HS curls 20# 2x10  Leg press 20# 2x10 Lateral band walks green  03/23/23 NuStep L5 x 6 min Resisted Gait 20lb 4 way x 3 each Hip add ball squeeze 2x10 Hip abd blue  2x15 Leg press 20lb 2x10 Supine bridges x10 SLR x10 each Supine hip Abd x10 PROM ad stretching of LE's Ball squeeze & bridges 2x10    03/21/23 Seated abd/marches blue t-band 2x10 HS curls red t-band 2x10 R hip med/lat rot yellow t-band 2x10 Bridges 2x10  Trunk rotation 2x10 SLR R 2lb 2x10 LE PROM and stretching; emphasis on ITB Standing R hip ext and adduction red t-band, B abduction 2x10   03/15/23- EVAL    PATIENT EDUCATION:  Education details: HEP and POC Person educated: Patient Education method: Explanation Education comprehension: verbalized understanding  HOME EXERCISE PROGRAM: Access Code: [email or web address] URL: [email or web address] Date: 03/15/2023 Prepared by: Cassie Freer  Exercises - Supine Bridge  - 1 x daily - 7 x weekly - 2 sets - 10 reps - Beginner Side Leg Lift  - 1 x daily - 7 x weekly - 2 sets - 10 reps - Clamshell with Resistance  - 1 x daily - 7 x weekly - 2 sets - 10 reps - Supine Piriformis Stretch with Foot on Ground  - 1 x daily - 7 x weekly - 2 reps - 15-30 hold  ASSESSMENT:  CLINICAL IMPRESSION: Patient was seen today for physical therapy treatment for R hip pain. We focused on mostly functional and isolated hip strengthening today. Cue for eccentric control with resisted gait. Fatigue present with sit to stands. No pain during interventions. RLE is tighter than L with passive stretching.Marland Kitchen She would benefit from continued PT to increase her strength and normalize her gait pattern.   OBJECTIVE IMPAIRMENTS: difficulty walking, decreased strength, and pain.   REHAB POTENTIAL: Good  CLINICAL DECISION MAKING: Stable/uncomplicated  EVALUATION COMPLEXITY: Low   GOALS: Goals reviewed with patient? Yes  SHORT TERM GOALS: Target date: 04/12/23  Patient will be independent with initial HEP. Goal status: INITIAL   LONG TERM GOALS: Target date: 05/17/23  Patient will be independent with advanced/ongoing HEP to improve outcomes and  carryover.  Goal status: INITIAL  2.  Patient will report at least 75% improvement in R hip pain to improve QOL. Baseline: 4/10 pain /Goal status: Progressing 03/23/23  3.  Patient will demonstrate improved functional R hip strength as demonstrated by 5/5 for hip extension and abduction. Baseline: 4-/5 Goal status: INITIAL  4.  Patient will be able to ambulate 500' with normal gait pattern and no pain  Baseline: pain with walking Goal status: INITIAL  5.  Patient will report 85 on FOTO (patient reported outcome measure) to demonstrate improved functional ability. Baseline: 41 Goal status: INITIAL   PLAN:  PT FREQUENCY: 2x/week  PT DURATION: 8 weeks  PLANNED INTERVENTIONS: Therapeutic exercises, Therapeutic activity, Neuromuscular re-education, Balance training, Gait training, Patient/Family education, Self Care, Joint mobilization, Stair training, Dry Needling, Electrical stimulation, Cryotherapy, Moist heat, Ultrasound, Ionotophoresis 4mg /ml Dexamethasone, and Manual therapy  PLAN FOR NEXT SESSION: pain management for bursitis and tendonosis, R hip strengthening    Grayce Sessions, PTA, SPTA 04/10/2023, 9:30 AM

## 2023-04-11 ENCOUNTER — Ambulatory Visit: Payer: BC Managed Care – PPO | Admitting: Physical Therapy

## 2023-04-11 DIAGNOSIS — Z6825 Body mass index (BMI) 25.0-25.9, adult: Secondary | ICD-10-CM | POA: Diagnosis not present

## 2023-04-11 DIAGNOSIS — M48062 Spinal stenosis, lumbar region with neurogenic claudication: Secondary | ICD-10-CM | POA: Diagnosis not present

## 2023-04-12 ENCOUNTER — Encounter: Payer: Self-pay | Admitting: Sports Medicine

## 2023-04-12 ENCOUNTER — Ambulatory Visit (INDEPENDENT_AMBULATORY_CARE_PROVIDER_SITE_OTHER): Payer: BC Managed Care – PPO | Admitting: Sports Medicine

## 2023-04-12 VITALS — BP 126/76 | HR 72 | Ht 65.0 in | Wt 150.0 lb

## 2023-04-12 DIAGNOSIS — S76011D Strain of muscle, fascia and tendon of right hip, subsequent encounter: Secondary | ICD-10-CM

## 2023-04-12 DIAGNOSIS — M67951 Unspecified disorder of synovium and tendon, right thigh: Secondary | ICD-10-CM

## 2023-04-12 DIAGNOSIS — M25551 Pain in right hip: Secondary | ICD-10-CM

## 2023-04-12 DIAGNOSIS — M7631 Iliotibial band syndrome, right leg: Secondary | ICD-10-CM | POA: Diagnosis not present

## 2023-04-12 NOTE — Progress Notes (Signed)
Kimberly Cox - 77 y.o. female MRN [id number]  Date of birth: Jan 27, 1946  Office Visit Note: Visit Date: 04/12/2023 PCP: Etta Grandchild, MD Referred by: Etta Grandchild, MD  Subjective: Chief Complaint  Patient presents with   Right Hip - Pain, Follow-up    Patient returns for shockwave therapy right hip. She feels that she is getting better gradually. She feels that between this therapy and physical therapy, she is doing better. She takes celebrex and tylenol arthritis but is unsure if they help.   HPI: Kimberly Cox is a pleasant 77 y.o. female who presents today for follow-up of right chronic lateral hip pain.  She has completed 3 sessions of extracorporeal shockwave therapy which she does note she is finding benefit from this - feels overall like she is about 70% improved.  She also continues with formalized physical therapy.  She is managed on Celebrex 100 mg twice daily as needed as well as Tylenol, unsure of the clear benefit.  Has had 6 sessions of formalized PT. sometimes doing home exercises as well. Having slightly more tenderness palpating down the lateral thigh over the IT band.  Pertinent ROS were reviewed with the patient and found to be negative unless otherwise specified above in HPI.   Assessment & Plan: Visit Diagnoses:  1. Tear of right gluteus minimus tendon, subsequent encounter   2. Tendinopathy of right gluteus medius   3. Pain of right hip   4. It band syndrome, right    Plan: Discussed with Harriett Sine she continues to make improvement from extracorporeal shockwave therapy as well as her formalized physical therapy.  She did have some improvement in resisted hip abduction but is still weaker on this right side compared to the left.  I do think she had a mild exacerbation of her IT band that is likely working to compensate for the weakness of the gluteal tendons.  Did repeat shockwave therapy here and address the proximal to mid IT band as well.  We will work on  reducing her NSAID burden, she is currently taking Celebrex 100 mg twice daily, I would like her to reduce this to 100 mg only once daily at this time.  Eventually if her pain is doing well we will take her off of this completely.  Okay to use Tylenol as needed. Follow-up in 1 week.   We also discussed the role for PRP injection therapy - we discussed this in the room as well as provide her with a handout should she proceed with this in addition to our current treatment.  Follow-up: Return in about 1 week (around 04/19/2023) for lateral hip pain (reg visit).   Meds & Orders: No orders of the defined types were placed in this encounter.  No orders of the defined types were placed in this encounter.    Procedures:  Procedure: ECSWT Indications:  Gluteus minimus/medius tendinopathy   Procedure Details Consent: Risks of procedure as well as the alternatives and risks of each were explained to the patient.  Verbal consent for procedure obtained. Time Out: Verified patient identification, verified procedure, site was marked, verified correct patient position. The area was cleaned with alcohol swab.     The right GT and gluteus tendons were targeted for Extracorporeal shockwave therapy.    Preset: Trochanteric bursitis/lateral hip pain Power Level: 120 mJ   Frequency: 12-13 Hz Impulse/cycles: 3000 Head size: Regular   Patient tolerated procedure well without immediate complications.      Clinical  History: No specialty comments available.  She reports that she has quit smoking. She has never used smokeless tobacco. No results for input(s): "HGBA1C", "LABURIC" in the last 8760 hours.  Objective:   Vital Signs: BP 126/76   Pulse 72   Ht 5\' 5"  (1.651 m)   Wt 150 lb (68 kg)   BMI 24.96 kg/m   Physical Exam  Gen: Well-appearing, in no acute distress; non-toxic CV:  Well-perfused. Warm.  Resp: Breathing unlabored on room air; no wheezing. Psych: Fluid speech in conversation; appropriate  affect; normal thought process Neuro: Sensation intact throughout. No gross coordination deficits.   Ortho Exam - Right hip: Mild TTP over the greater trochanter but certainly improved from previous visit.  There is some mild tightness and pain with deep palpation of the proximal to mid IT band.  There is mildly improved strength with resisted hip abduction but still 4/5 strength compared to full strength of the contralateral hip.  Imaging: No results found.  Past Medical/Family/Surgical/Social History: Medications & Allergies reviewed per EMR, new medications updated. Patient Active Problem List   Diagnosis Date Noted   Diuretic-induced hypokalemia 03/31/2020   Sciatica of right side 05/01/2018   Piriformis syndrome of left side 11/27/2017   GERD with esophagitis 08/22/2017   Asthmatic bronchitis 08/22/2017   Prediabetes 02/02/2017   Pure hyperglyceridemia 02/02/2017   Degenerative cervical disc 01/26/2016   Degenerative lumbar disc 01/26/2016   Spinal stenosis of lumbar region with radiculopathy 01/20/2016   Routine general medical examination at a health care facility 07/18/2014   Hereditary and idiopathic peripheral neuropathy 01/31/2008   Hyperlipidemia LDL goal <130 12/05/2007   Essential hypertension 12/05/2007   Past Medical History:  Diagnosis Date   Anxiety    Anxiety disorder    Concussion Dec 13th, 2011   fell, striking her head against a wall, developed dizziness - UC eval c/w concussion. Had follow-up and did ok.    History of chest pain    Hyperlipidemia    Hypertension    Plantar fasciitis    Spinal stenosis, lumbar    Family History  Problem Relation Age of Onset   Cancer Mother    Coronary artery disease Father    Past Surgical History:  Procedure Laterality Date   ARTHRODESIS METATARSALPHALANGEAL JOINT (MTPJ) Right 10/08/2015   Procedure: RIGHT HALLUX METATARSAL PHALANGEAL JOINT  ARTHRODESIS;  Surgeon: Toni Arthurs, MD;  Location: [facility];  Service: Orthopedics;  Laterality: Right;   caesarean section     CESAREAN SECTION     HAMMERTOE RECONSTRUCTION WITH WEIL OSTEOTOMY Right 10/08/2015   Procedure: RIGHT SECOND METATARSAL WEIL HAMMERTOE CORRECTION;  Surgeon: Toni Arthurs, MD;  Location: [facility];  Service: Orthopedics;  Laterality: Right;   TONSILLECTOMY     Social History   Occupational History   Not on file  Tobacco Use   Smoking status: Former   Smokeless tobacco: Never   Tobacco comments:    only in h.s.  Vaping Use   Vaping status: Never Used  Substance and Sexual Activity   Alcohol use: Yes    Comment: occ   Drug use: No   Sexual activity: Not Currently    Birth control/protection: Post-menopausal    Comment: 1st intercourse- 20, partners- 1

## 2023-04-12 NOTE — Patient Instructions (Addendum)
Dr. Shon Baton' Instructions and What to Expect for PRP Injections:  Platelet-rich plasma is used in musculoskeletal medicine to focus your own body's ability to heal. It has several well-done published research trials (RCT) which demonstrate both its effectiveness and safety in many musculoskeletal conditions, including osteoarthritis, tendinopathies, partial tendon tears, and damaged vertebral discs. PRP has been in clinical use since the 1990's. Many people know that platelets form a clot if there is a cut in the skin. It turns out that platelets do not only form a clot, but they also start the body's own repair process. When platelets activate to form a clot, they release alpha granules which have hundreds of chemical messengers in them that initiate and organize repair to the damaged tissue. Precisely placing PRP into the site of injury will initiate the healing process by activating on the damaged cartilage, bone or tendon. This is an inflammatory process, and inflammation is the vital first phase of healing.  PRP will initiate healing and a productive inflammation, and PRP therapy will make the body part treated sore for 4 days to two weeks. Anti-inflammatory drugs (i.e. ibuprofen, Naprosyn, Celebrex) and corticosteroids such as prednisone can blunt or stop this process, so it is important to not take any anti-inflammatory drugs for 7 days before getting PRP therapy, or for at least three weeks after PRP therapy. Corticosteroid injections can blunt inflammation for 30 days, so let us know if you have had one recently. Depending on the body part injected, you may be in a sling or on crutches for several days. Just like wringing out a wet dishcloth, if you load or tense a tendon or ligament that has just been injected with PRP, some of the PRP injected will squish out. By keeping the body part treated relaxed by using a sling (for the shoulder or arm) or crutches (for hips and legs) for a few days, the  PRP can bind in place and do its job.   What to expect and how to prepare for PRP:   Depending on the procedure, you may need to arrange for a driver to bring you home (i.e. procedure on right/driving leg). IF you are having a lower extremity procedure, we can provide crutches as needed, but these are not routinely needed.   10 days prior to the procedure: Stop taking anti-inflammatory drugs like Ibuprofen/Motrin, Advil, Naprosyn, Celebrex, or Meloxicam. Even aspirin should be stopped (but need to discuss this with Dr. Shon Baton and your cardiologist beforehand). Let Dr. Shon Baton know if you have been taking prednisone or other corticosteroids in the last 30 days as this can negatively interfere with the PRP process.   The day before the procedure: thoroughly shower and clean your skin.    The day of the procedure: Wear loose-fitting clothing like sweatpants or shorts. If you are having an upper body procedure wear a top that can button or zip up. Please show up at least 15 minutes early for your appointment, as we will need to take you back to draw your blood prior to the procedure.    Things to avoid prior to procedure: tobacco/nicotine, alcohol, fatty foods. Tobacco is a potent toxin and its use constricts small blood vessels which are needed for tissue repair. Tobacco/nicotine use will limit the effectiveness of any treatment and stopping tobacco use is one of the single greatest actions you can take to improve your health. Avoid toxins like alcohol, which inhibits and depresses the cells needed for tissue repair.  PRP will  initiate healing and a productive inflammation, and PRP therapy will make the body part treated sore for the first 3-4 days up to two weeks. Anti-inflammatory drugs (i.e. ibuprofen, Naprosyn, Celebrex) and corticosteroids such as prednisone can blunt or stop this process, so it is important to not take any anti-inflammatory drugs for 10 days before getting PRP therapy, or for at  least two weeks after PRP therapy.   Depending on the body part injected, you may be in a sling or on crutches for several days. Most of the time these are not needed, but it is important to allow time for the body part to rest after the injection. By keeping the body part treated relaxed and not loading the body part the PRP can bind in place and do its job. If you push the body part too early, this may make the PRP less effective.  What happens during the PRP procedure?  Platelet rich plasma is made by taking some of your blood and performing a two-stage centrifuge process on it to concentrate the PRP. First, your blood is drawn into a syringe with a small amount of anti-coagulant in it (this is to keep the blood from clotting during this process). The amount of blood drawn is usually about 10-30 milliliters, depending on how much PRP is needed for the treatment. Then the blood is transferred in a sterile fashion into a centrifuge tube. It is then centrifuged for the first cycle where the red blood cells are isolated and discarded. In the second centrifuge cycle, the platelet-rich fraction of the remaining plasma is concentrated and placed in a syringe. The skin at the injection site is numbed with a small amount of topical cooling spray. Dr. Shon Baton will then precisely inject the PRP into the injury site using ultrasound guidance.  What to do after your procedure:   NO anti-inflammatories (Ibuprofen/Motrin, Aleve, Meloxicam, etc.) for 2 weeks after injection. It is OK to use Tylenol only if needed. I can also prescribe you specific medicine to control any discomfort you may have after the procedure if needed.   NO applying ice to the affected area for 2 weeks after the injection. It is OK to use heat.   Rest the affected body part. By keeping the body part treated relaxed and not loading the body part the PRP can bind in place and do its job. Be sure to ask Dr. Shon Baton specific post-injection rest  and guidelines to follow following your injection, as these will differ slightly depending on what type of PRP injection you receive. In general:   Allow 3-4 days of rest from physical labor or repetitive activity for the affected body part. It is ok to move the area, but no lifting or strenuous activity. After 3 days, unless otherwise instructed, the treated body part should be used and slowly moved through its full range of motion. It will be sore, but you will not damage it by moving it, in fact it needs to move to heal.   No strenuous activity, lifting weights, or dedicated therapy until the 2-week mark from the injection. After 2 weeks from the injection, this is when it is most advantageous to start physical therapy.   After two weeks, you may begin returning to activity, but it is a good idea to avoid activities that specifically hurt you before being treated. Exercise is vital to good health and finding a way to cross train around your injury is important not only for your physical health,  but your mental health as well. Ask me about cross training options for your injury. Some brief (10 minutes or less) period of heat therapy will not hurt the rehab, but it is not required. Usually, depending on the initial injury, physical therapy is started from two weeks to three weeks after injection. Improvements in pain and function should be expected from 6 weeks to 12 weeks after injection and some injuries may require more than one treatment.

## 2023-04-13 ENCOUNTER — Ambulatory Visit: Payer: BC Managed Care – PPO | Admitting: Physical Therapy

## 2023-04-18 ENCOUNTER — Ambulatory Visit: Payer: BC Managed Care – PPO | Admitting: Physical Therapy

## 2023-04-18 DIAGNOSIS — M25551 Pain in right hip: Secondary | ICD-10-CM | POA: Diagnosis not present

## 2023-04-18 DIAGNOSIS — R262 Difficulty in walking, not elsewhere classified: Secondary | ICD-10-CM

## 2023-04-18 DIAGNOSIS — M6281 Muscle weakness (generalized): Secondary | ICD-10-CM

## 2023-04-18 DIAGNOSIS — R278 Other lack of coordination: Secondary | ICD-10-CM | POA: Diagnosis not present

## 2023-04-18 NOTE — Therapy (Signed)
OUTPATIENT PHYSICAL THERAPY LOWER EXTREMITY    Patient Name: Kimberly Cox MRN: [id number] DOB:October 04, 1945, 77 y.o., female Today's Date: 04/18/2023  END OF SESSION:  PT End of Session - 04/18/23 1018     Visit Number 7    Date for PT Re-Evaluation 05/17/23    Authorization Type [employer]    PT Start Time 1015    PT Stop Time 1100    PT Time Calculation (min) 45 min               Past Medical History:  Diagnosis Date   Anxiety    Anxiety disorder    Concussion Dec 13th, 2011   fell, striking her head against a wall, developed dizziness - UC eval c/w concussion. Had follow-up and did ok.    History of chest pain    Hyperlipidemia    Hypertension    Plantar fasciitis    Spinal stenosis, lumbar    Past Surgical History:  Procedure Laterality Date   ARTHRODESIS METATARSALPHALANGEAL JOINT (MTPJ) Right 10/08/2015   Procedure: RIGHT HALLUX METATARSAL PHALANGEAL JOINT  ARTHRODESIS;  Surgeon: Toni Arthurs, MD;  Location: [facility];  Service: Orthopedics;  Laterality: Right;   caesarean section     CESAREAN SECTION     HAMMERTOE RECONSTRUCTION WITH WEIL OSTEOTOMY Right 10/08/2015   Procedure: RIGHT SECOND METATARSAL WEIL HAMMERTOE CORRECTION;  Surgeon: Toni Arthurs, MD;  Location: [facility];  Service: Orthopedics;  Laterality: Right;   TONSILLECTOMY     Patient Active Problem List   Diagnosis Date Noted   Diuretic-induced hypokalemia 03/31/2020   Sciatica of right side 05/01/2018   Piriformis syndrome of left side 11/27/2017   GERD with esophagitis 08/22/2017   Asthmatic bronchitis 08/22/2017   Prediabetes 02/02/2017   Pure hyperglyceridemia 02/02/2017   Degenerative cervical disc 01/26/2016   Degenerative lumbar disc 01/26/2016   Spinal stenosis of lumbar region with radiculopathy 01/20/2016   Routine general medical examination at a health care facility 07/18/2014   Hereditary and idiopathic peripheral neuropathy 01/31/2008   Hyperlipidemia  LDL goal <130 12/05/2007   Essential hypertension 12/05/2007    PCP: Sanda Linger  REFERRING PROVIDER: Richardean Canal  REFERRING DIAG: M25.551 (ICD-10-CM) - Pain of right hip   THERAPY DIAG:  Pain in right hip  Difficulty in walking, not elsewhere classified  Muscle weakness (generalized)  Rationale for Evaluation and Treatment: Rehabilitation  ONSET DATE: 03/03/23  SUBJECTIVE:   SUBJECTIVE STATEMENT: 70-80% better with PT,shockwave and meds. Getting back injection Thursday. Had to stop celebrex for injection as of last night PERTINENT HISTORY: Spinal stenosis at L3-L4 intra-articular injection gave her good relief for about a week. She is also had 2 trochanteric injections first 1 gave her some relief second 1 gave her no relief. Patient denies any groin pain. Pain mostly lateral aspect of the hip. No radicular symptoms down the leg.   PAIN:  Are you having pain? Yes: NPRS scale: 0/10 Pain location: R hip, starting to travel down the leg  Pain description: dull, ache Aggravating factors: walking, steps Relieving factors: medication   PRECAUTIONS: None  WEIGHT BEARING RESTRICTIONS: No  FALLS:  Has patient fallen in last 6 months? No  LIVING ENVIRONMENT: Lives with: lives with their spouse Lives in: House/apartment Stairs: No Has following equipment at home: Single point cane  OCCUPATION: Retired  PLOF: Independent  PATIENT GOALS: to get rid of the pain  NEXT MD VISIT: 03/20/23  OBJECTIVE:   DIAGNOSTIC FINDINGS: MRI right hip without  contrast dated 03/01/2023 is reviewed with the patient.  Images reviewed. This shows right gluteus minimus tendon with severe tendinosis.  There is also high-grade partial-thickness tear at the insertion.  Mild right gluteus medius tendinosis without tear.  Mild right hip osteoarthritis.  Trochanteric bursitis on the right is mild.  Mild edema is seen intermuscular portion of the proximal abductor compartment muscular EXTR.  Right  greater than left.  Right gluteus medius muscle edema also seen felt to reflect low-grade strains.   Impression: Right hip severe tendinosis right gluteus minimus tendon with high-grade partial-thickness tear at the insertion. Right hip osteoarthritis mild  PATIENT SURVEYS:  FOTO 41  COGNITION: Overall cognitive status: Within functional limits for tasks assessed     SENSATION: WFL  MUSCLE LENGTH: Tightness in BLE  PALPATION: She has tenderness over the right greater trochanteric region.   LOWER EXTREMITY ROM: WFL    LOWER EXTREMITY MMT:  MMT Right eval Left eval  Hip flexion 4+ 5  Hip extension 4-   Hip abduction 4-   Hip adduction    Hip internal rotation    Hip external rotation    Knee flexion 5 5  Knee extension 5 5  Ankle dorsiflexion    Ankle plantarflexion    Ankle inversion    Ankle eversion     (Blank rows = not tested)  FUNCTIONAL TESTS:  5 times sit to stand: 12.43s   TODAY'S TREATMENT:                                                                                                                              DATE:   04/18/23 Bike L 4 TM SW 1 1/2 min each way CGA with cuing 6 inch step up fwd with opp hip ext 10 x each 6 inch lateral step up with opp hip abd 10 x each Resisted gait 40# 5 x 4 ways Leg press 3 sets 12 feet 3 way for hips 30# ( 40 was too heavy) 10# cable pulley 10 x 4 ways BIL- ABD harder Left >RT STS on airex 10x ADD ball squeeze with bridge 10 x hold 3 sec Green tband clams 10x then 10 x with Bridge Knee ext 10# 2 sets 10 HS curl 20# 2 sets 10        04/10/23 Bike L 3 x 6 min 4in box on airex lateral step ups  Resisted gait 30# 4 way x4 HS curls 25lb 2x10 S2S holding blue ball 2x10 Hip Abd green then bridg 2x10  Bilat hip strtching all directions  04/06/23 NuStep L5x48mins  Step ups, lateral step ups 6"   Resisted gait 20# 4 way x4 STS with yellow ball 2x10 Hip abd blue seated 2x10 Ball squeeze 2x10  3 way  hip with 3# 2x10 IT band and HS stretch 30s    04/04/23 NuStep L5 x66mins  Bridges 2x10 SLR 2x10  RLE SL hip abd 2x10 RLE  Clamshells  green 2x10  Stretching glutes, piriformis, and HS 30s each side  Leg ext 10# 2x10  HS curls 20# 2x10  Leg press 20# 2x10 Lateral band walks green  03/23/23 NuStep L5 x 6 min Resisted Gait 20lb 4 way x 3 each Hip add ball squeeze 2x10 Hip abd blue 2x15 Leg press 20lb 2x10 Supine bridges x10 SLR x10 each Supine hip Abd x10 PROM ad stretching of LE's Ball squeeze & bridges 2x10    03/21/23 Seated abd/marches blue t-band 2x10 HS curls red t-band 2x10 R hip med/lat rot yellow t-band 2x10 Bridges 2x10 Trunk rotation 2x10 SLR R 2lb 2x10 LE PROM and stretching; emphasis on ITB Standing R hip ext and adduction red t-band, B abduction 2x10   03/15/23- EVAL    PATIENT EDUCATION:  Education details: HEP and POC Person educated: Patient Education method: Explanation Education comprehension: verbalized understanding  HOME EXERCISE PROGRAM: Access Code: [email or web address] URL: [email or web address] Date: 03/15/2023 Prepared by: Cassie Freer  Exercises - Supine Bridge  - 1 x daily - 7 x weekly - 2 sets - 10 reps - Beginner Side Leg Lift  - 1 x daily - 7 x weekly - 2 sets - 10 reps - Clamshell with Resistance  - 1 x daily - 7 x weekly - 2 sets - 10 reps - Supine Piriformis Stretch with Foot on Ground  - 1 x daily - 7 x weekly - 2 reps - 15-30 hold  ASSESSMENT:  CLINICAL IMPRESSION: Pt arrives 70-80% better with pain, weakness is an issue. Focus session on strength with cuing for correct tech OBJECTIVE IMPAIRMENTS: difficulty walking, decreased strength, and pain.   REHAB POTENTIAL: Good  CLINICAL DECISION MAKING: Stable/uncomplicated  EVALUATION COMPLEXITY: Low   GOALS: Goals reviewed with patient? Yes  SHORT TERM GOALS: Target date: 04/12/23  Patient will be independent with initial HEP. Goal status: INITIAL   LONG TERM  GOALS: Target date: 05/17/23  Patient will be independent with advanced/ongoing HEP to improve outcomes and carryover.  Goal status: INITIAL  2.  Patient will report at least 75% improvement in R hip pain to improve QOL. Baseline: 4/10 pain /Goal status: Progressing 03/23/23  3.  Patient will demonstrate improved functional R hip strength as demonstrated by 5/5 for hip extension and abduction. Baseline: 4-/5 Goal status: INITIAL  4.  Patient will be able to ambulate 500' with normal gait pattern and no pain  Baseline: pain with walking Goal status: INITIAL  5.  Patient will report 67 on FOTO (patient reported outcome measure) to demonstrate improved functional ability. Baseline: 41 Goal status: INITIAL   PLAN:  PT FREQUENCY: 2x/week  PT DURATION: 8 weeks  PLANNED INTERVENTIONS: Therapeutic exercises, Therapeutic activity, Neuromuscular re-education, Balance training, Gait training, Patient/Family education, Self Care, Joint mobilization, Stair training, Dry Needling, Electrical stimulation, Cryotherapy, Moist heat, Ultrasound, Ionotophoresis 4mg /ml Dexamethasone, and Manual therapy  PLAN FOR NEXT SESSION: LE strengthening, check goals   Haadiya Frogge,ANGIE, PTA, SPTA 04/18/2023, 10:19 AM  [facility] at [facility] [place], [place], [address] Phone: [phone]   Fax:  [phone]

## 2023-04-19 ENCOUNTER — Ambulatory Visit (INDEPENDENT_AMBULATORY_CARE_PROVIDER_SITE_OTHER): Payer: BC Managed Care – PPO | Admitting: Sports Medicine

## 2023-04-19 ENCOUNTER — Encounter: Payer: Self-pay | Admitting: Sports Medicine

## 2023-04-19 DIAGNOSIS — M25551 Pain in right hip: Secondary | ICD-10-CM

## 2023-04-19 DIAGNOSIS — M67951 Unspecified disorder of synovium and tendon, right thigh: Secondary | ICD-10-CM

## 2023-04-19 DIAGNOSIS — S76011D Strain of muscle, fascia and tendon of right hip, subsequent encounter: Secondary | ICD-10-CM

## 2023-04-19 NOTE — Progress Notes (Signed)
Kimberly Cox - 77 y.o. female MRN [id number]  Date of birth: 1946-03-02  Office Visit Note: Visit Date: 04/19/2023 PCP: No primary care provider on file. Referred by: Kimberly Grandchild, MD  Subjective: Chief Complaint  Patient presents with   Right Hip - Follow-up   HPI: Kimberly Cox is a pleasant 77 y.o. female who presents today for follow-up of right chronic lateral hip pain.   She has completed 4 sessions of extracorporeal shockwave therapy which she does note she is finding benefit from this - feels overall like she is definitely quite improved.  She also continues with formalized physical therapy.  Her back has been flaring up recently, she sees Dr. Danielle Dess.  She does have a spinal injection tomorrow, she is curious to see how much this relieves her pain in general.  She has discontinued her Celebrex completely, taking Tylenol only as needed.  Pertinent ROS were reviewed with the patient and found to be negative unless otherwise specified above in HPI.   Assessment & Plan: Visit Diagnoses:  1. Tear of right gluteus minimus tendon, subsequent encounter   2. Tendinopathy of right gluteus medius   3. Pain of right hip    Plan: Lundin has made rather good improvements in reduction in pain as well as strength of her lateral hip with extracorporeal shockwave therapy as well as formalized physical therapy.  She does have some worsening of her back pain which I think is contributing to her overall pain on that side, she does have an upcoming spinal injection tomorrow, curious to see overall relief of her pain following this.  In terms of the hip, we will follow-up in 1 week for repeat ESWT and reevaluation.  After this last treatment, we will likely take about a 1 month break to see the cumulative benefit she received from extracorporeal shockwave therapy.  Did discuss possible PRP injection therapy to aid in augmentation of her relief in this interim depending on her improvement.  She will  follow-up in 1 week for treatment and discussion.  Follow-up: Return in about 1 week (around 04/26/2023) for R-lat hip pain (reg visit).   Meds & Orders: No orders of the defined types were placed in this encounter.  No orders of the defined types were placed in this encounter.    Procedures:  Procedure: ECSWT Indications:  Gluteus minimus/medius tendinopathy   Procedure Details Consent: Risks of procedure as well as the alternatives and risks of each were explained to the patient.  Verbal consent for procedure obtained. Time Out: Verified patient identification, verified procedure, site was marked, verified correct patient position. The area was cleaned with alcohol swab.     The right GT and gluteus tendons were targeted for Extracorporeal shockwave therapy.    Preset: Trochanteric bursitis/lateral hip pain Power Level: 120 mJ  Frequency: 13 Hz Impulse/cycles: 3400 Head size: Regular   Patient tolerated procedure well without immediate complications.       Clinical History: No specialty comments available.  She reports that she has quit smoking. She has never used smokeless tobacco. No results for input(s): "HGBA1C", "LABURIC" in the last 8760 hours.  Objective:    Physical Exam  Gen: Well-appearing, in no acute distress; non-toxic CV:  Well-perfused. Warm.  Resp: Breathing unlabored on room air; no wheezing. Psych: Fluid speech in conversation; appropriate affect; normal thought process Neuro: Sensation intact throughout. No gross coordination deficits.   Ortho Exam - Right hip: There is not much tenderness to palpation  over the greater trochanter or the IT band.  There is improved strength with near equivocal 5/5 strength with resisted hip abduction on this hip compared to the contralateral side.  No redness or swelling.  Imaging: No results found.  Past Medical/Family/Surgical/Social History: Medications & Allergies reviewed per EMR, new medications  updated. Patient Active Problem List   Diagnosis Date Noted   Diuretic-induced hypokalemia 03/31/2020   Sciatica of right side 05/01/2018   Piriformis syndrome of left side 11/27/2017   GERD with esophagitis 08/22/2017   Asthmatic bronchitis 08/22/2017   Prediabetes 02/02/2017   Pure hyperglyceridemia 02/02/2017   Degenerative cervical disc 01/26/2016   Degenerative lumbar disc 01/26/2016   Spinal stenosis of lumbar region with radiculopathy 01/20/2016   Routine general medical examination at a health care facility 07/18/2014   Hereditary and idiopathic peripheral neuropathy 01/31/2008   Hyperlipidemia LDL goal <130 12/05/2007   Essential hypertension 12/05/2007   Past Medical History:  Diagnosis Date   Anxiety    Anxiety disorder    Concussion Dec 13th, 2011   fell, striking her head against a wall, developed dizziness - UC eval c/w concussion. Had follow-up and did ok.    History of chest pain    Hyperlipidemia    Hypertension    Plantar fasciitis    Spinal stenosis, lumbar    Family History  Problem Relation Age of Onset   Cancer Mother    Coronary artery disease Father    Past Surgical History:  Procedure Laterality Date   ARTHRODESIS METATARSALPHALANGEAL JOINT (MTPJ) Right 10/08/2015   Procedure: RIGHT HALLUX METATARSAL PHALANGEAL JOINT  ARTHRODESIS;  Surgeon: Toni Arthurs, MD;  Location: [facility];  Service: Orthopedics;  Laterality: Right;   caesarean section     CESAREAN SECTION     HAMMERTOE RECONSTRUCTION WITH WEIL OSTEOTOMY Right 10/08/2015   Procedure: RIGHT SECOND METATARSAL WEIL HAMMERTOE CORRECTION;  Surgeon: Toni Arthurs, MD;  Location: [facility];  Service: Orthopedics;  Laterality: Right;   TONSILLECTOMY     Social History   Occupational History   Not on file  Tobacco Use   Smoking status: Former   Smokeless tobacco: Never   Tobacco comments:    only in h.s.  Vaping Use   Vaping status: Never Used  Substance and Sexual  Activity   Alcohol use: Yes    Comment: occ   Drug use: No   Sexual activity: Not Currently    Birth control/protection: Post-menopausal    Comment: 1st intercourse- 20, partners- 1

## 2023-04-20 DIAGNOSIS — M5416 Radiculopathy, lumbar region: Secondary | ICD-10-CM | POA: Diagnosis not present

## 2023-04-20 DIAGNOSIS — M5116 Intervertebral disc disorders with radiculopathy, lumbar region: Secondary | ICD-10-CM | POA: Diagnosis not present

## 2023-04-25 ENCOUNTER — Ambulatory Visit: Payer: BC Managed Care – PPO | Admitting: Physical Therapy

## 2023-04-25 DIAGNOSIS — M6281 Muscle weakness (generalized): Secondary | ICD-10-CM | POA: Diagnosis not present

## 2023-04-25 DIAGNOSIS — R278 Other lack of coordination: Secondary | ICD-10-CM | POA: Diagnosis not present

## 2023-04-25 DIAGNOSIS — R262 Difficulty in walking, not elsewhere classified: Secondary | ICD-10-CM | POA: Diagnosis not present

## 2023-04-25 DIAGNOSIS — M25551 Pain in right hip: Secondary | ICD-10-CM | POA: Diagnosis not present

## 2023-04-25 NOTE — Therapy (Signed)
OUTPATIENT PHYSICAL THERAPY LOWER EXTREMITY    Patient Name: Kimberly Cox MRN: [id number] DOB:1946/03/11, 77 y.o., female Today's Date: 04/25/2023  END OF SESSION:  PT End of Session - 04/25/23 0929     Visit Number 8    Date for PT Re-Evaluation 05/17/23    Authorization Type BC[employer]    PT Start Time 0930    PT Stop Time 1015    PT Time Calculation (min) 45 min               Past Medical History:  Diagnosis Date   Anxiety    Anxiety disorder    Concussion Dec 13th, 2011   fell, striking her head against a wall, developed dizziness - UC eval c/w concussion. Had follow-up and did ok.    History of chest pain    Hyperlipidemia    Hypertension    Plantar fasciitis    Spinal stenosis, lumbar    Past Surgical History:  Procedure Laterality Date   ARTHRODESIS METATARSALPHALANGEAL JOINT (MTPJ) Right 10/08/2015   Procedure: RIGHT HALLUX METATARSAL PHALANGEAL JOINT  ARTHRODESIS;  Surgeon: Toni Arthurs, MD;  Location: [facility];  Service: Orthopedics;  Laterality: Right;   caesarean section     CESAREAN SECTION     HAMMERTOE RECONSTRUCTION WITH WEIL OSTEOTOMY Right 10/08/2015   Procedure: RIGHT SECOND METATARSAL WEIL HAMMERTOE CORRECTION;  Surgeon: Toni Arthurs, MD;  Location: [facility];  Service: Orthopedics;  Laterality: Right;   TONSILLECTOMY     Patient Active Problem List   Diagnosis Date Noted   Diuretic-induced hypokalemia 03/31/2020   Sciatica of right side 05/01/2018   Piriformis syndrome of left side 11/27/2017   GERD with esophagitis 08/22/2017   Asthmatic bronchitis 08/22/2017   Prediabetes 02/02/2017   Pure hyperglyceridemia 02/02/2017   Degenerative cervical disc 01/26/2016   Degenerative lumbar disc 01/26/2016   Spinal stenosis of lumbar region with radiculopathy 01/20/2016   Routine general medical examination at a health care facility 07/18/2014   Hereditary and idiopathic peripheral neuropathy 01/31/2008   Hyperlipidemia  LDL goal <130 12/05/2007   Essential hypertension 12/05/2007    PCP: Sanda Linger  REFERRING PROVIDER: Richardean Canal  REFERRING DIAG: M25.551 (ICD-10-CM) - Pain of right hip   THERAPY DIAG:  Difficulty in walking, not elsewhere classified  Muscle weakness (generalized)  Rationale for Evaluation and Treatment: Rehabilitation  ONSET DATE: 03/03/23  SUBJECTIVE:   SUBJECTIVE STATEMENT: got back inj and back feels better- some pain in buttock ( points to ishical tub), hip is doing well PERTINENT HISTORY: Spinal stenosis at L3-L4 intra-articular injection gave her good relief for about a week. She is also had 2 trochanteric injections first 1 gave her some relief second 1 gave her no relief. Patient denies any groin pain. Pain mostly lateral aspect of the hip. No radicular symptoms down the leg.   PAIN:  Are you having pain? Yes: NPRS scale: 0/10 Pain location: R hip, starting to travel down the leg  Pain description: dull, ache Aggravating factors: walking, steps Relieving factors: medication   PRECAUTIONS: None  WEIGHT BEARING RESTRICTIONS: No  FALLS:  Has patient fallen in last 6 months? No  LIVING ENVIRONMENT: Lives with: lives with their spouse Lives in: House/apartment Stairs: No Has following equipment at home: Single point cane  OCCUPATION: Retired  PLOF: Independent  PATIENT GOALS: to get rid of the pain  NEXT MD VISIT: 03/20/23  OBJECTIVE:   DIAGNOSTIC FINDINGS: MRI right hip without contrast dated 03/01/2023 is reviewed  with the patient.  Images reviewed. This shows right gluteus minimus tendon with severe tendinosis.  There is also high-grade partial-thickness tear at the insertion.  Mild right gluteus medius tendinosis without tear.  Mild right hip osteoarthritis.  Trochanteric bursitis on the right is mild.  Mild edema is seen intermuscular portion of the proximal abductor compartment muscular EXTR.  Right greater than left.  Right gluteus medius muscle  edema also seen felt to reflect low-grade strains.   Impression: Right hip severe tendinosis right gluteus minimus tendon with high-grade partial-thickness tear at the insertion. Right hip osteoarthritis mild  PATIENT SURVEYS:  FOTO 41  COGNITION: Overall cognitive status: Within functional limits for tasks assessed     SENSATION: WFL  MUSCLE LENGTH: Tightness in BLE  PALPATION: She has tenderness over the right greater trochanteric region.   LOWER EXTREMITY ROM: WFL    LOWER EXTREMITY MMT:  MMT Right eval Left eval  Hip flexion 4+ 5  Hip extension 4-   Hip abduction 4-   Hip adduction    Hip internal rotation    Hip external rotation    Knee flexion 5 5  Knee extension 5 5  Ankle dorsiflexion    Ankle plantarflexion    Ankle inversion    Ankle eversion     (Blank rows = not tested)  FUNCTIONAL TESTS:  5 times sit to stand: 12.43s   TODAY'S TREATMENT:                                                                                                                              DATE:   04/25/23 Pelvic ROM on sit fit 10 x 4 way Red tband 4 way scap stab on sit fit Green tband hip 3 way 12 x each Knee ext 10# 2 sets 12 HS curl 25# 2 sets 12 Walked 1000 plus feet all surfaces, good cadence Mod Dead Lift 6# 2 sets 10 Leg press 3 sets 12 feet 3 way for hips 30# Calf raises 2 sets 15 30#   04/18/23 Bike L 4 TM SW 1 1/2 min each way CGA with cuing 6 inch step up fwd with opp hip ext 10 x each 6 inch lateral step up with opp hip abd 10 x each Resisted gait 40# 5 x 4 ways Leg press 3 sets 12 feet 3 way for hips 30# ( 40 was too heavy) 10# cable pulley 10 x 4 ways BIL- ABD harder Left >RT STS on airex 10x ADD ball squeeze with bridge 10 x hold 3 sec Green tband clams 10x then 10 x with Bridge Knee ext 10# 2 sets 10 HS curl 20# 2 sets 10        04/10/23 Bike L 3 x 6 min 4in box on airex lateral step ups  Resisted gait 30# 4 way x4 HS curls 25lb  2x10 S2S holding blue ball 2x10 Hip Abd green then bridg 2x10  Bilat hip strtching  all directions  04/06/23 NuStep L5x34mins  Step ups, lateral step ups 6"   Resisted gait 20# 4 way x4 STS with yellow ball 2x10 Hip abd blue seated 2x10 Ball squeeze 2x10  3 way hip with 3# 2x10 IT band and HS stretch 30s    04/04/23 NuStep L5 x30mins  Bridges 2x10 SLR 2x10  RLE SL hip abd 2x10 RLE  Clamshells green 2x10  Stretching glutes, piriformis, and HS 30s each side  Leg ext 10# 2x10  HS curls 20# 2x10  Leg press 20# 2x10 Lateral band walks green  03/23/23 NuStep L5 x 6 min Resisted Gait 20lb 4 way x 3 each Hip add ball squeeze 2x10 Hip abd blue 2x15 Leg press 20lb 2x10 Supine bridges x10 SLR x10 each Supine hip Abd x10 PROM ad stretching of LE's Ball squeeze & bridges 2x10    03/21/23 Seated abd/marches blue t-band 2x10 HS curls red t-band 2x10 R hip med/lat rot yellow t-band 2x10 Bridges 2x10 Trunk rotation 2x10 SLR R 2lb 2x10 LE PROM and stretching; emphasis on ITB Standing R hip ext and adduction red t-band, B abduction 2x10   03/15/23- EVAL    PATIENT EDUCATION:  Education details: HEP and POC Person educated: Patient Education method: Explanation Education comprehension: verbalized understanding  HOME EXERCISE PROGRAM: Access Code: [email or web address] URL: [email or web address] Date: 03/15/2023 Prepared by: Cassie Freer  Exercises - Supine Bridge  - 1 x daily - 7 x weekly - 2 sets - 10 reps - Beginner Side Leg Lift  - 1 x daily - 7 x weekly - 2 sets - 10 reps - Clamshell with Resistance  - 1 x daily - 7 x weekly - 2 sets - 10 reps - Supine Piriformis Stretch with Foot on Ground  - 1 x daily - 7 x weekly - 2 reps - 15-30 hold  ASSESSMENT:  CLINICAL IMPRESSION:  Focus session on strength with cuing for correct tech. Goals assessed and documented OBJECTIVE IMPAIRMENTS: difficulty walking, decreased strength, and pain.   REHAB POTENTIAL: Good  CLINICAL  DECISION MAKING: Stable/uncomplicated  EVALUATION COMPLEXITY: Low   GOALS: Goals reviewed with patient? Yes  SHORT TERM GOALS: Target date: 04/12/23  Patient will be independent with initial HEP. Goal status: 04/25/23 MET   LONG TERM GOALS: Target date: 05/17/23  Patient will be independent with advanced/ongoing HEP to improve outcomes and carryover.  Goal status: INITIAL  2.  Patient will report at least 75% improvement in R hip pain to improve QOL. Baseline: 4/10 pain /Goal status: Progressing 03/23/23  3.  Patient will demonstrate improved functional R hip strength as demonstrated by 5/5 for hip extension and abduction. Baseline: 4-/5 Goal status: 04/25/23 MET  4.  Patient will be able to ambulate 500' with normal gait pattern and no pain  Baseline: pain with walking Goal status: 04/25/23 MET  5.  Patient will report 55 on FOTO (patient reported outcome measure) to demonstrate improved functional ability. Baseline: 41 Goal status: INITIAL   PLAN:  PT FREQUENCY: 2x/week  PT DURATION: 8 weeks  PLANNED INTERVENTIONS: Therapeutic exercises, Therapeutic activity, Neuromuscular re-education, Balance training, Gait training, Patient/Family education, Self Care, Joint mobilization, Stair training, Dry Needling, Electrical stimulation, Cryotherapy, Moist heat, Ultrasound, Ionotophoresis 4mg /ml Dexamethasone, and Manual therapy  PLAN FOR NEXT SESSION: LE strengthening   Briteny Fulghum,ANGIE, PTA, SPTA 04/25/2023, 9:29 AM  [facility] Outpatient Rehabilitation at [facility] [address] [facility] [place], [place], [address] Phone: [phone]   Fax:  [phone] [facility]   Outpatient Rehabilitation at [facility] [address] [facility] [place], [place], [address] Phone: [phone]   Fax:  [phone]  Patient Details  Name: SAUMYA DUGUE MRN: [id number] Date of Birth: 31-Jan-1946 Referring Provider:  Etta Grandchild, MD  Encounter Date:  04/25/2023   Suanne Marker, PTA 04/25/2023, 9:29 AM  [place] [facility] at [facility] [address] [facility] [place], [place], [address] Phone: [phone]   Fax:  [phone]

## 2023-04-26 ENCOUNTER — Encounter: Payer: Self-pay | Admitting: Sports Medicine

## 2023-04-26 ENCOUNTER — Ambulatory Visit (INDEPENDENT_AMBULATORY_CARE_PROVIDER_SITE_OTHER): Payer: BC Managed Care – PPO | Admitting: Sports Medicine

## 2023-04-26 VITALS — BP 118/78 | HR 76 | Ht 65.0 in | Wt 150.0 lb

## 2023-04-26 DIAGNOSIS — S76011D Strain of muscle, fascia and tendon of right hip, subsequent encounter: Secondary | ICD-10-CM | POA: Diagnosis not present

## 2023-04-26 DIAGNOSIS — M48061 Spinal stenosis, lumbar region without neurogenic claudication: Secondary | ICD-10-CM | POA: Diagnosis not present

## 2023-04-26 DIAGNOSIS — M1611 Unilateral primary osteoarthritis, right hip: Secondary | ICD-10-CM

## 2023-04-26 DIAGNOSIS — M67951 Unspecified disorder of synovium and tendon, right thigh: Secondary | ICD-10-CM

## 2023-04-26 DIAGNOSIS — M5416 Radiculopathy, lumbar region: Secondary | ICD-10-CM

## 2023-04-26 NOTE — Progress Notes (Signed)
Kimberly Cox - 77 y.o. female MRN [id number]  Date of birth: 08-14-1946  Office Visit Note: Visit Date: 04/26/2023 PCP: No primary care provider on file. Referred by: Etta Grandchild, MD  Subjective: Chief Complaint  Patient presents with   Right Hip - Pain, Follow-up   HPI: Kimberly Cox is a pleasant 77 y.o. female who presents today for follow-up of right chronic lateral hip pain.  Kimberly Cox has completed 5 sessions of extracorporeal shockwave therapy, at this point she is experiencing no pain over the lateral hip.  The hip in general is markedly improved, at times will notice some pain over the anterior hip when going from sitting to standing.  She does continue with formalized PT.  Did have a lumbar ESI last week which helped take care of some of her back and radiating pain.  She is seeking a second opinion at [facility] for her back.  She does have Celebrex 100 mg once to twice daily as needed but does not take this consistently.  At times feels like her legs may be weaker when walking secondary to her back.  Independent chart review and review of MRI from years past shows severe spinal stenosis most notable at L4-L5, but throughout lumbar spine.  Pertinent ROS were reviewed with the patient and found to be negative unless otherwise specified above in HPI.   Assessment & Plan: Visit Diagnoses:  1. Tear of right gluteus minimus tendon, subsequent encounter   2. Tendinopathy of right gluteus medius   3. Unilateral primary osteoarthritis, right hip   4. Spinal stenosis of lumbar region with radiculopathy    Plan: Kimberly Cox made excellent progress in her right lateral hip pain with known gluteus minimus/medius tearing, she essentially has no pain whatsoever over the lateral hip.  We did complete our last session of extracorporeal shockwave therapy and will discontinue at this time.  She may continue with her formal PT and transition to a home exercise plan from my standpoint.  We did discuss  seeing over the next 1 month what sort of cumulative relief she receives, she will notify me in a month how she is doing and we can always consider additional ESWT only if needed.  She can follow-up with Dr. Magnus Ivan and Rexene Edison for the right hip follow-up.  She occasionally will have pain overlying the proximal femur, specifically with going from sitting to standing.  Did discuss this seems more arthritic in nature although review of her x-ray and MRI is certainly not substantial.  Could always consider repeat trial of intra-articular injection.  In terms of her back, she did receive relief from [facility], she is seeking second opinion at [facility] in terms of surgical intervention.  May continue Celebrex 100 mg once daily as needed.  Follow-up: No follow-ups on file.   Meds & Orders: No orders of the defined types were placed in this encounter.  No orders of the defined types were placed in this encounter.    Procedures: Procedure: ECSWT Indications:  Gluteus minimus/medius tendinopathy   Procedure Details Consent: Risks of procedure as well as the alternatives and risks of each were explained to the patient.  Verbal consent for procedure obtained. Time Out: Verified patient identification, verified procedure, site was marked, verified correct patient position. The area was cleaned with alcohol swab.     The right GT and gluteus tendons were targeted for Extracorporeal shockwave therapy.    Preset: Trochanteric bursitis/lateral hip pain Power Level: 120 mJ  Frequency:  13 Hz Impulse/cycles: 3400 Head size: Regular   Patient tolerated procedure well without immediate complications.        Clinical History: No specialty comments available.  She reports that she has quit smoking. She has never used smokeless tobacco. No results for input(s): "HGBA1C", "LABURIC" in the last 8760 hours.  Objective:   Vital Signs: BP 118/78   Pulse 76   Ht 5\' 5"  (1.651 m)   Wt 150 lb (68 kg)   BMI 24.96 kg/m    Physical Exam  Gen: Well-appearing, in no acute distress; non-toxic CV: Well-perfused. Warm.  Resp: Breathing unlabored on room air; no wheezing. Psych: Fluid speech in conversation; appropriate affect; normal thought process Neuro: Sensation intact throughout. No gross coordination deficits.   Ortho Exam - Right hip: No TTP overlying the greater trochanteric today, no redness or swelling.  She has markedly improved resisted hip abduction which is equivocal to the contralateral side at this point.  She has no mechanical restrictions to internal or external rotation but there is some pain with Darden Palmer and FADIR testing.  Imaging:  Narrative & Impression  CLINICAL DATA:  Hip pain, chronic, articular cartilage eval, xray done assess cartilage, troch bursitis, assess tendon   EXAM: MR OF THE RIGHT HIP WITHOUT CONTRAST   TECHNIQUE: Multiplanar, multisequence MR imaging was performed. No intravenous contrast was administered.   COMPARISON:  X-ray 02/06/2023   FINDINGS: Bones: No acute fracture. No dislocation. No femoral head avascular necrosis. Bony pelvis intact without diastasis. SI joints and pubic symphysis within normal limits. Degenerative lower lumbar spondylosis, not well assessed. No bone marrow edema. No marrow replacing bone lesion.   Articular cartilage and labrum   Articular cartilage: Mild chondral thinning. There is fissuring along the superior chondrolabral junction. No subchondral marrow edema.   Labrum:  Superior labral degeneration.  No paralabral cyst.   Joint or bursal effusion   Joint effusion:  None.   Bursae: Small-moderate volume right peritrochanteric bursal fluid.   Muscles and tendons   Muscles and tendons: Severe tendinosis of the right gluteus minimus tendon with high-grade partial-thickness insertional tearing. Mild right gluteus medius tendinosis without tear. Mild tendinosis of the contralateral left gluteal tendons. The  hamstring, iliopsoas, rectus femoris, and adductor tendons appear intact without tear or significant tendinosis. Mild intramuscular edema within the proximal adductor compartment musculature, right greater than left. Mild intramuscular edema within the right gluteus medius muscle.   Other findings   Miscellaneous: No soft tissue edema or fluid collection. No inguinal lymphadenopathy.   IMPRESSION: 1. Severe tendinosis of the right gluteus minimus tendon with high-grade partial-thickness insertional tearing. Mild right gluteus medius tendinosis without tear. 2. Mild-moderate right peritrochanteric bursitis. 3. Mild intramuscular edema within the proximal adductor compartment musculature, right greater than left, and right gluteus medius muscle which may reflect low-grade strains. 4. Mild right hip osteoarthritis.     Electronically Signed   By: Duanne Guess D.O.   On: 03/01/2023 13:55   R-hip XR 02/06/23: An AP pelvis and lateral of the right hip shows normal-appearing hips  bilaterally with no significant joint space narrowing or arthritis.  There  are no significant irregularities around the trochanteric area of the  right hip where the patient hurts.   Narrative & Impression  CLINICAL DATA:  Low back pain with bilateral leg pain with numbness and weakness, right greater than left, chronic.   EXAM: MRI LUMBAR SPINE WITHOUT CONTRAST   TECHNIQUE: Multiplanar, multisequence MR imaging of the lumbar spine was  performed. No intravenous contrast was administered.   COMPARISON:  Lumbar radiographs dated 01/20/2016   FINDINGS: Segmentation:  Standard.   Alignment: Slight lumbar scoliosis with convexity to the left centered at L3-4.   Vertebrae:  No fracture, evidence of discitis, or bone lesion.   Conus medullaris and cauda equina: Conus extends to the T12-L1 level. Conus and cauda equina appear normal.   Paraspinal and other soft tissues: Negative.   Disc  levels:   T12-L1: Negative.   L1-2: Marked disc space narrowing with chronic degenerative changes of the vertebral endplates. There is what appears to be a chronic ossified central disc extrusion extending superiorly in the midline behind the body of L1. There is also a small broad-based disc protrusion with accompanying osteophytes central and to the left extending into the left neural foramen. There is slight bilateral facet arthritis with slight hypertrophy of the ligamentum flavum. This combination of abnormalities creates moderately severe spinal stenosis and compression of the lateral recesses, left more than right. The L1 nerves appear to exit without impingement.   L2-3: Marked disc space narrowing. Small broad-based disc protrusion with accompanying osteophytes. Hypertrophy of the ligamentum flavum and facet joints, right more than left combine to create moderate spinal stenosis and bilateral lateral recess compression. Moderate right foraminal stenosis. However, the right L2 nerve appears to exit without impingement.   L3-4: Marked disc space narrowing. Broad-based small disc protrusion with accompanying osteophytes symmetrically compresses the thecal sac and both lateral recesses, creating moderately severe spinal stenosis. Moderate right foraminal stenosis. However, the L3 nerve exits without impingement.   L4-5: Marked disc space narrowing. Small broad-based disc protrusion with accompanying osteophytes. Marked hypertrophy of the ligamentum flavum and facet joints combine to create severe spinal stenosis with compression of both lateral recesses which could affect either or both L5 nerves. This is best demonstrated on image 22 of series 6. No foraminal stenosis of significance.   L5-S1: Small broad-based disc bulge with slight compression of the thecal sac. Hypertrophy of the facet joints and ligamentum flavum impinge on both lateral recesses and appear to compress  both S1 nerves, best seen on image 27 of series 6.   IMPRESSION: 1. Severe spinal stenosis and lateral recess impingement at L4-5. 2. Compression of both S1 nerves in the lateral recesses at L5-S1. 3. Moderately severe spinal stenosis at L1-2, L2-3, and L3-4. 4. Probable chronic ossified disc extrusion at L1-2 as described above.     Electronically Signed   By: Francene Boyers M.D.   On: 05/11/2018 10:20    Past Medical/Family/Surgical/Social History: Medications & Allergies reviewed per EMR, new medications updated. Patient Active Problem List   Diagnosis Date Noted   Diuretic-induced hypokalemia 03/31/2020   Sciatica of right side 05/01/2018   Piriformis syndrome of left side 11/27/2017   GERD with esophagitis 08/22/2017   Asthmatic bronchitis 08/22/2017   Prediabetes 02/02/2017   Pure hyperglyceridemia 02/02/2017   Degenerative cervical disc 01/26/2016   Degenerative lumbar disc 01/26/2016   Spinal stenosis of lumbar region with radiculopathy 01/20/2016   Routine general medical examination at a health care facility 07/18/2014   Hereditary and idiopathic peripheral neuropathy 01/31/2008   Hyperlipidemia LDL goal <130 12/05/2007   Essential hypertension 12/05/2007   Past Medical History:  Diagnosis Date   Anxiety    Anxiety disorder    Concussion Dec 13th, 2011   fell, striking her head against a wall, developed dizziness - UC eval c/w concussion. Had follow-up and did ok.  History of chest pain    Hyperlipidemia    Hypertension    Plantar fasciitis    Spinal stenosis, lumbar    Family History  Problem Relation Age of Onset   Cancer Mother    Coronary artery disease Father    Past Surgical History:  Procedure Laterality Date   ARTHRODESIS METATARSALPHALANGEAL JOINT (MTPJ) Right 10/08/2015   Procedure: RIGHT HALLUX METATARSAL PHALANGEAL JOINT  ARTHRODESIS;  Surgeon: Toni Arthurs, MD;  Location: [facility];  Service: Orthopedics;  Laterality: Right;    caesarean section     CESAREAN SECTION     HAMMERTOE RECONSTRUCTION WITH WEIL OSTEOTOMY Right 10/08/2015   Procedure: RIGHT SECOND METATARSAL WEIL HAMMERTOE CORRECTION;  Surgeon: Toni Arthurs, MD;  Location: [facility];  Service: Orthopedics;  Laterality: Right;   TONSILLECTOMY     Social History   Occupational History   Not on file  Tobacco Use   Smoking status: Former   Smokeless tobacco: Never   Tobacco comments:    only in h.s.  Vaping Use   Vaping status: Never Used  Substance and Sexual Activity   Alcohol use: Yes    Comment: occ   Drug use: No   Sexual activity: Not Currently    Birth control/protection: Post-menopausal    Comment: 1st intercourse- 20, partners- 1

## 2023-04-26 NOTE — Progress Notes (Signed)
Doing good No concerns Wants another shock wave treatment

## 2023-04-27 ENCOUNTER — Encounter: Payer: Self-pay | Admitting: Physical Therapy

## 2023-04-27 ENCOUNTER — Ambulatory Visit: Payer: BC Managed Care – PPO | Admitting: Physical Therapy

## 2023-04-27 DIAGNOSIS — R262 Difficulty in walking, not elsewhere classified: Secondary | ICD-10-CM | POA: Diagnosis not present

## 2023-04-27 DIAGNOSIS — M25551 Pain in right hip: Secondary | ICD-10-CM

## 2023-04-27 DIAGNOSIS — M6281 Muscle weakness (generalized): Secondary | ICD-10-CM

## 2023-04-27 DIAGNOSIS — R278 Other lack of coordination: Secondary | ICD-10-CM | POA: Diagnosis not present

## 2023-04-27 NOTE — Therapy (Signed)
OUTPATIENT PHYSICAL THERAPY LOWER EXTREMITY    Patient Name: Kimberly Cox MRN: [id number] DOB:05-Mar-1946, 77 y.o., female Today's Date: 04/27/2023  END OF SESSION:  PT End of Session - 04/27/23 0929     Visit Number 9    Date for PT Re-Evaluation 05/17/23    Authorization Type BCBS    PT Start Time 0930    PT Stop Time 1015    PT Time Calculation (min) 45 min    Activity Tolerance Patient tolerated treatment well    Behavior During Therapy [facility] for tasks assessed/performed               Past Medical History:  Diagnosis Date   Anxiety    Anxiety disorder    Concussion Dec 13th, 2011   fell, striking her head against a wall, developed dizziness - UC eval c/w concussion. Had follow-up and did ok.    History of chest pain    Hyperlipidemia    Hypertension    Plantar fasciitis    Spinal stenosis, lumbar    Past Surgical History:  Procedure Laterality Date   ARTHRODESIS METATARSALPHALANGEAL JOINT (MTPJ) Right 10/08/2015   Procedure: RIGHT HALLUX METATARSAL PHALANGEAL JOINT  ARTHRODESIS;  Surgeon: Toni Arthurs, MD;  Location: [facility];  Service: Orthopedics;  Laterality: Right;   caesarean section     CESAREAN SECTION     HAMMERTOE RECONSTRUCTION WITH WEIL OSTEOTOMY Right 10/08/2015   Procedure: RIGHT SECOND METATARSAL WEIL HAMMERTOE CORRECTION;  Surgeon: Toni Arthurs, MD;  Location: [facility];  Service: Orthopedics;  Laterality: Right;   TONSILLECTOMY     Patient Active Problem List   Diagnosis Date Noted   Diuretic-induced hypokalemia 03/31/2020   Sciatica of right side 05/01/2018   Piriformis syndrome of left side 11/27/2017   GERD with esophagitis 08/22/2017   Asthmatic bronchitis 08/22/2017   Prediabetes 02/02/2017   Pure hyperglyceridemia 02/02/2017   Degenerative cervical disc 01/26/2016   Degenerative lumbar disc 01/26/2016   Spinal stenosis of lumbar region with radiculopathy 01/20/2016   Routine general medical examination at  a health care facility 07/18/2014   Hereditary and idiopathic peripheral neuropathy 01/31/2008   Hyperlipidemia LDL goal <130 12/05/2007   Essential hypertension 12/05/2007    PCP: Sanda Linger  REFERRING PROVIDER: Richardean Canal  REFERRING DIAG: M25.551 (ICD-10-CM) - Pain of right hip   THERAPY DIAG:  Difficulty in walking, not elsewhere classified  Muscle weakness (generalized)  Pain in right hip  Other lack of coordination  Rationale for Evaluation and Treatment: Rehabilitation  ONSET DATE: 03/03/23  SUBJECTIVE:   SUBJECTIVE STATEMENT: got back inj and back feels better  The back is a little better, has had some treatment on the hip PERTINENT HISTORY: Spinal stenosis at L3-L4 intra-articular injection gave her good relief for about a week. She is also had 2 trochanteric injections first 1 gave her some relief second 1 gave her no relief. Patient denies any groin pain. Pain mostly lateral aspect of the hip. No radicular symptoms down the leg.   PAIN:  Are you having pain? Yes: NPRS scale: 0/10 Pain location: R hip, starting to travel down the leg  Pain description: dull, ache Aggravating factors: walking, steps Relieving factors: medication   PRECAUTIONS: None  WEIGHT BEARING RESTRICTIONS: No  FALLS:  Has patient fallen in last 6 months? No  LIVING ENVIRONMENT: Lives with: lives with their spouse Lives in: House/apartment Stairs: No Has following equipment at home: Single point cane  OCCUPATION: Retired  PLOF:  Independent  PATIENT GOALS: to get rid of the pain  NEXT MD VISIT: 03/20/23  OBJECTIVE:   DIAGNOSTIC FINDINGS: MRI right hip without contrast dated 03/01/2023 is reviewed with the patient.  Images reviewed. This shows right gluteus minimus tendon with severe tendinosis.  There is also high-grade partial-thickness tear at the insertion.  Mild right gluteus medius tendinosis without tear.  Mild right hip osteoarthritis.  Trochanteric bursitis on the  right is mild.  Mild edema is seen intermuscular portion of the proximal abductor compartment muscular EXTR.  Right greater than left.  Right gluteus medius muscle edema also seen felt to reflect low-grade strains.   Impression: Right hip severe tendinosis right gluteus minimus tendon with high-grade partial-thickness tear at the insertion. Right hip osteoarthritis mild  PATIENT SURVEYS:  FOTO 41  COGNITION: Overall cognitive status: Within functional limits for tasks assessed     SENSATION: WFL  MUSCLE LENGTH: Tightness in BLE  PALPATION: She has tenderness over the right greater trochanteric region.   LOWER EXTREMITY ROM: WFL    LOWER EXTREMITY MMT:  MMT Right eval Left eval  Hip flexion 4+ 5  Hip extension 4-   Hip abduction 4-   Hip adduction    Hip internal rotation    Hip external rotation    Knee flexion 5 5  Knee extension 5 5  Ankle dorsiflexion    Ankle plantarflexion    Ankle inversion    Ankle eversion     (Blank rows = not tested)  FUNCTIONAL TESTS:  5 times sit to stand: 12.43s   TODAY'S TREATMENT:                                                                                                                              DATE:  04/27/23 Feet on ball K2C, rotation, small bridge and isometric abs Passive stretch LE's STM with t-gun left HS, right glutes, ITB and HS HEP and performed added today  04/25/23 Pelvic ROM on sit fit 10 x 4 way Red tband 4 way scap stab on sit fit Green tband hip 3 way 12 x each Knee ext 10# 2 sets 12 HS curl 25# 2 sets 12 Walked 1000 plus feet all surfaces, good cadence Mod Dead Lift 6# 2 sets 10 Leg press 3 sets 12 feet 3 way for hips 30# Calf raises 2 sets 15 30#  04/18/23 Bike L 4 TM SW 1 1/2 min each way CGA with cuing 6 inch step up fwd with opp hip ext 10 x each 6 inch lateral step up with opp hip abd 10 x each Resisted gait 40# 5 x 4 ways Leg press 3 sets 12 feet 3 way for hips 30# ( 40 was too  heavy) 10# cable pulley 10 x 4 ways BIL- ABD harder Left >RT STS on airex 10x ADD ball squeeze with bridge 10 x hold 3 sec Green tband clams 10x then 10 x with Bridge Knee  ext 10# 2 sets 10 HS curl 20# 2 sets 10  04/10/23 Bike L 3 x 6 min 4in box on airex lateral step ups  Resisted gait 30# 4 way x4 HS curls 25lb 2x10 S2S holding blue ball 2x10 Hip Abd green then bridg 2x10  Bilat hip strtching all directions  04/06/23 NuStep L5x95mins  Step ups, lateral step ups 6"   Resisted gait 20# 4 way x4 STS with yellow ball 2x10 Hip abd blue seated 2x10 Ball squeeze 2x10  3 way hip with 3# 2x10 IT band and HS stretch 30s    04/04/23 NuStep L5 x53mins  Bridges 2x10 SLR 2x10  RLE SL hip abd 2x10 RLE  Clamshells green 2x10  Stretching glutes, piriformis, and HS 30s each side  Leg ext 10# 2x10  HS curls 20# 2x10  Leg press 20# 2x10 Lateral band walks green  03/23/23 NuStep L5 x 6 min Resisted Gait 20lb 4 way x 3 each Hip add ball squeeze 2x10 Hip abd blue 2x15 Leg press 20lb 2x10 Supine bridges x10 SLR x10 each Supine hip Abd x10 PROM ad stretching of LE's Ball squeeze & bridges 2x10    03/21/23 Seated abd/marches blue t-band 2x10 HS curls red t-band 2x10 R hip med/lat rot yellow t-band 2x10 Bridges 2x10 Trunk rotation 2x10 SLR R 2lb 2x10 LE PROM and stretching; emphasis on ITB Standing R hip ext and adduction red t-band, B abduction 2x10   03/15/23- EVAL    PATIENT EDUCATION:  Education details: HEP and POC Person educated: Patient Education method: Explanation Education comprehension: verbalized understanding  HOME EXERCISE PROGRAM: Access Code: [email or web address] URL: [email or web address] Date: 04/27/2023 Prepared by: Stacie Glaze  Exercises - Seated Hamstring Stretch with Chair  - 1 x daily - 7 x weekly - 2 sets - 5 reps - 30 hold - Seated Table Hamstring Stretch  - 1 x daily - 7 x weekly - 2 sets - 5 reps - 30 hold - Supine Piriformis Stretch  Pulling Heel to Hip  - 1 x daily - 7 x weekly - 2 sets - 5 reps - 15 hold - Supine Hip Adductor Stretch  - 1 x daily - 7 x weekly - 2 sets - 5 reps - 15 hold   Access Code: [email or web address] URL: [email or web address] Date: 03/15/2023 Prepared by: Cassie Freer  Exercises - Supine Bridge  - 1 x daily - 7 x weekly - 2 sets - 10 reps - Beginner Side Leg Lift  - 1 x daily - 7 x weekly - 2 sets - 10 reps - Clamshell with Resistance  - 1 x daily - 7 x weekly - 2 sets - 10 reps - Supine Piriformis Stretch with Foot on Ground  - 1 x daily - 7 x weekly - 2 reps - 15-30 hold  ASSESSMENT:  CLINICAL IMPRESSION:  Patient reports that she is overall doing well, she is still having tightness in the right hip and the left HS, c/o tenderness.  I focused todays treatment on stretching and flexibility and then added STM to the tender areas the right gluteals and the left HS OBJECTIVE IMPAIRMENTS: difficulty walking, decreased strength, and pain.   REHAB POTENTIAL: Good  CLINICAL DECISION MAKING: Stable/uncomplicated  EVALUATION COMPLEXITY: Low   GOALS: Goals reviewed with patient? Yes  SHORT TERM GOALS: Target date: 04/12/23  Patient will be independent with initial HEP. Goal status: 04/25/23 MET   LONG TERM GOALS: Target date: 05/17/23  Patient will be independent with advanced/ongoing HEP to improve  outcomes and carryover.  Goal status: progressing 04/27/23  2.  Patient will report at least 75% improvement in R hip pain to improve QOL. Baseline: 4/10 pain /Goal status: Progressing 04/27/23  3.  Patient will demonstrate improved functional R hip strength as demonstrated by 5/5 for hip extension and abduction. Baseline: 4-/5 Goal status: 04/25/23 MET  4.  Patient will be able to ambulate 500' with normal gait pattern and no pain  Baseline: pain with walking Goal status: 04/25/23 MET  5.  Patient will report 10 on FOTO (patient reported outcome measure) to demonstrate improved  functional ability. Baseline: 41 Goal status: INITIAL   PLAN:  PT FREQUENCY: 2x/week  PT DURATION: 8 weeks  PLANNED INTERVENTIONS: Therapeutic exercises, Therapeutic activity, Neuromuscular re-education, Balance training, Gait training, Patient/Family education, Self Care, Joint mobilization, Stair training, Dry Needling, Electrical stimulation, Cryotherapy, Moist heat, Ultrasound, Ionotophoresis 4mg /ml Dexamethasone, and Manual therapy  PLAN FOR NEXT SESSION: LE strengthening, flexibility, STM   Jearld Lesch, PT 04/27/2023, 9:29 AM  [facility] at [facility] [address] [facility] [place], [place], [address] Phone: [phone]   Fax:  [phone]

## 2023-04-28 DIAGNOSIS — M5416 Radiculopathy, lumbar region: Secondary | ICD-10-CM | POA: Diagnosis not present

## 2023-04-28 DIAGNOSIS — M4150 Other secondary scoliosis, site unspecified: Secondary | ICD-10-CM | POA: Diagnosis not present

## 2023-05-02 ENCOUNTER — Ambulatory Visit: Payer: BC Managed Care – PPO | Admitting: Physical Therapy

## 2023-05-04 ENCOUNTER — Ambulatory Visit: Payer: BC Managed Care – PPO | Admitting: Physical Therapy

## 2023-05-17 ENCOUNTER — Other Ambulatory Visit: Payer: Self-pay | Admitting: Internal Medicine

## 2023-05-17 DIAGNOSIS — Z1231 Encounter for screening mammogram for malignant neoplasm of breast: Secondary | ICD-10-CM

## 2023-05-22 ENCOUNTER — Ambulatory Visit: Payer: BC Managed Care – PPO | Attending: Internal Medicine | Admitting: Physical Therapy

## 2023-05-22 ENCOUNTER — Encounter: Payer: Self-pay | Admitting: Physical Therapy

## 2023-05-22 DIAGNOSIS — M25551 Pain in right hip: Secondary | ICD-10-CM | POA: Insufficient documentation

## 2023-05-22 DIAGNOSIS — R262 Difficulty in walking, not elsewhere classified: Secondary | ICD-10-CM | POA: Diagnosis not present

## 2023-05-22 DIAGNOSIS — M6281 Muscle weakness (generalized): Secondary | ICD-10-CM | POA: Diagnosis not present

## 2023-05-22 NOTE — Therapy (Signed)
OUTPATIENT PHYSICAL THERAPY LOWER EXTREMITY    Patient Name: Kimberly Cox MRN: [id number] DOB:Dec 06, 1945, 77 y.o., female Today's Date: 05/22/2023  END OF SESSION:  PT End of Session - 05/22/23 0844     Visit Number 10    Date for PT Re-Evaluation 07/22/23    Authorization Type BCBS    PT Start Time 0845    PT Stop Time 0930    PT Time Calculation (min) 45 min    Activity Tolerance Patient tolerated treatment well    Behavior During Therapy [facility] for tasks assessed/performed               Past Medical History:  Diagnosis Date   Anxiety    Anxiety disorder    Concussion Dec 13th, 2011   fell, striking her head against a wall, developed dizziness - UC eval c/w concussion. Had follow-up and did ok.    History of chest pain    Hyperlipidemia    Hypertension    Plantar fasciitis    Spinal stenosis, lumbar    Past Surgical History:  Procedure Laterality Date   ARTHRODESIS METATARSALPHALANGEAL JOINT (MTPJ) Right 10/08/2015   Procedure: RIGHT HALLUX METATARSAL PHALANGEAL JOINT  ARTHRODESIS;  Surgeon: Toni Arthurs, MD;  Location: [facility];  Service: Orthopedics;  Laterality: Right;   caesarean section     CESAREAN SECTION     HAMMERTOE RECONSTRUCTION WITH WEIL OSTEOTOMY Right 10/08/2015   Procedure: RIGHT SECOND METATARSAL WEIL HAMMERTOE CORRECTION;  Surgeon: Toni Arthurs, MD;  Location: [facility];  Service: Orthopedics;  Laterality: Right;   TONSILLECTOMY     Patient Active Problem List   Diagnosis Date Noted   Diuretic-induced hypokalemia 03/31/2020   Sciatica of right side 05/01/2018   Piriformis syndrome of left side 11/27/2017   GERD with esophagitis 08/22/2017   Asthmatic bronchitis 08/22/2017   Prediabetes 02/02/2017   Pure hyperglyceridemia 02/02/2017   Degenerative cervical disc 01/26/2016   Degenerative lumbar disc 01/26/2016   Spinal stenosis of lumbar region with radiculopathy 01/20/2016   Routine general medical examination  at a health care facility 07/18/2014   Hereditary and idiopathic peripheral neuropathy 01/31/2008   Hyperlipidemia LDL goal <130 12/05/2007   Essential hypertension 12/05/2007    PCP: Sanda Linger  REFERRING PROVIDER: Richardean Canal  REFERRING DIAG: M25.551 (ICD-10-CM) - Pain of right hip   THERAPY DIAG:  Difficulty in walking, not elsewhere classified - Plan: PT plan of care cert/re-cert  Muscle weakness (generalized) - Plan: PT plan of care cert/re-cert  Pain in right hip - Plan: PT plan of care cert/re-cert  Rationale for Evaluation and Treatment: Rehabilitation  ONSET DATE: 03/03/23  SUBJECTIVE:   SUBJECTIVE STATEMENT: Reports that the hip is feeling okay, and the back is really hurting more, does report a consult with neurosurgeon at [facility] recently PERTINENT HISTORY: Spinal stenosis at L3-L4 intra-articular injection gave her good relief for about a week. She is also had 2 trochanteric injections first 1 gave her some relief second 1 gave her no relief. Patient denies any groin pain. Pain mostly lateral aspect of the hip. No radicular symptoms down the leg.   PAIN:  Are you having pain? Yes: NPRS scale: 0/10 Pain location: R hip, starting to travel down the leg  Pain description: dull, ache Aggravating factors: walking, steps Relieving factors: medication   PRECAUTIONS: None  WEIGHT BEARING RESTRICTIONS: No  FALLS:  Has patient fallen in last 6 months? No  LIVING ENVIRONMENT: Lives with: lives with their spouse  Lives in: House/apartment Stairs: No Has following equipment at home: Single point cane  OCCUPATION: Retired  PLOF: Independent  PATIENT GOALS: to get rid of the pain  NEXT MD VISIT: 03/20/23  OBJECTIVE:   DIAGNOSTIC FINDINGS: MRI right hip without contrast dated 03/01/2023 is reviewed with the patient.  Images reviewed. This shows right gluteus minimus tendon with severe tendinosis.  There is also high-grade partial-thickness tear at the insertion.   Mild right gluteus medius tendinosis without tear.  Mild right hip osteoarthritis.  Trochanteric bursitis on the right is mild.  Mild edema is seen intermuscular portion of the proximal abductor compartment muscular EXTR.  Right greater than left.  Right gluteus medius muscle edema also seen felt to reflect low-grade strains.   Impression: Right hip severe tendinosis right gluteus minimus tendon with high-grade partial-thickness tear at the insertion. Right hip osteoarthritis mild  PATIENT SURVEYS:  FOTO 41  COGNITION: Overall cognitive status: Within functional limits for tasks assessed     SENSATION: WFL  MUSCLE LENGTH: Tightness in BLE  PALPATION: She has tenderness over the right greater trochanteric region.   LOWER EXTREMITY ROM: WFL    LOWER EXTREMITY MMT:  MMT Right eval Left eval  Hip flexion 4+ 5  Hip extension 4-   Hip abduction 4-   Hip adduction    Hip internal rotation    Hip external rotation    Knee flexion 5 5  Knee extension 5 5  Ankle dorsiflexion    Ankle plantarflexion    Ankle inversion    Ankle eversion     (Blank rows = not tested)  FUNCTIONAL TESTS:  5 times sit to stand: 12.43s   TODAY'S TREATMENT:                                                                                                                              DATE:  05/22/23 Reviewed MRI of the lumbar and hip area Discussed options of treatment Feet on ball K2C, rotation, small bridge and isometric abs LE stretches Static lumbar traction 50#   04/27/23 Feet on ball K2C, rotation, small bridge and isometric abs Passive stretch LE's STM with t-gun left HS, right glutes, ITB and HS HEP and performed added today  04/25/23 Pelvic ROM on sit fit 10 x 4 way Red tband 4 way scap stab on sit fit Green tband hip 3 way 12 x each Knee ext 10# 2 sets 12 HS curl 25# 2 sets 12 Walked 1000 plus feet all surfaces, good cadence Mod Dead Lift 6# 2 sets 10 Leg press 3 sets 12 feet 3  way for hips 30# Calf raises 2 sets 15 30#  04/18/23 Bike L 4 TM SW 1 1/2 min each way CGA with cuing 6 inch step up fwd with opp hip ext 10 x each 6 inch lateral step up with opp hip abd 10 x each Resisted gait 40# 5 x 4 ways Leg press 3  sets 12 feet 3 way for hips 30# ( 40 was too heavy) 10# cable pulley 10 x 4 ways BIL- ABD harder Left >RT STS on airex 10x ADD ball squeeze with bridge 10 x hold 3 sec Green tband clams 10x then 10 x with Bridge Knee ext 10# 2 sets 10 HS curl 20# 2 sets 10  04/10/23 Bike L 3 x 6 min 4in box on airex lateral step ups  Resisted gait 30# 4 way x4 HS curls 25lb 2x10 S2S holding blue ball 2x10 Hip Abd green then bridg 2x10  Bilat hip strtching all directions  04/06/23 NuStep L5x51mins  Step ups, lateral step ups 6"   Resisted gait 20# 4 way x4 STS with yellow ball 2x10 Hip abd blue seated 2x10 Ball squeeze 2x10  3 way hip with 3# 2x10 IT band and HS stretch 30s    04/04/23 NuStep L5 x75mins  Bridges 2x10 SLR 2x10  RLE SL hip abd 2x10 RLE  Clamshells green 2x10  Stretching glutes, piriformis, and HS 30s each side  Leg ext 10# 2x10  HS curls 20# 2x10  Leg press 20# 2x10 Lateral band walks green  03/23/23 NuStep L5 x 6 min Resisted Gait 20lb 4 way x 3 each Hip add ball squeeze 2x10 Hip abd blue 2x15 Leg press 20lb 2x10 Supine bridges x10 SLR x10 each Supine hip Abd x10 PROM ad stretching of LE's Ball squeeze & bridges 2x10    03/21/23 Seated abd/marches blue t-band 2x10 HS curls red t-band 2x10 R hip med/lat rot yellow t-band 2x10 Bridges 2x10 Trunk rotation 2x10 SLR R 2lb 2x10 LE PROM and stretching; emphasis on ITB Standing R hip ext and adduction red t-band, B abduction 2x10   03/15/23- EVAL    PATIENT EDUCATION:  Education details: HEP and POC Person educated: Patient Education method: Explanation Education comprehension: verbalized understanding  HOME EXERCISE PROGRAM: Access Code: [email or web address] URL:  [email or web address] Date: 04/27/2023 Prepared by: Stacie Glaze  Exercises - Seated Hamstring Stretch with Chair  - 1 x daily - 7 x weekly - 2 sets - 5 reps - 30 hold - Seated Table Hamstring Stretch  - 1 x daily - 7 x weekly - 2 sets - 5 reps - 30 hold - Supine Piriformis Stretch Pulling Heel to Hip  - 1 x daily - 7 x weekly - 2 sets - 5 reps - 15 hold - Supine Hip Adductor Stretch  - 1 x daily - 7 x weekly - 2 sets - 5 reps - 15 hold   Access Code: [email or web address] URL: [email or web address] Date: 03/15/2023 Prepared by: Cassie Freer  Exercises - Supine Bridge  - 1 x daily - 7 x weekly - 2 sets - 10 reps - Beginner Side Leg Lift  - 1 x daily - 7 x weekly - 2 sets - 10 reps - Clamshell with Resistance  - 1 x daily - 7 x weekly - 2 sets - 10 reps - Supine Piriformis Stretch with Foot on Ground  - 1 x daily - 7 x weekly - 2 reps - 15-30 hold  ASSESSMENT:  CLINICAL IMPRESSION:  Patient reports that she has been doing well, having more back pain than hip pain, reports that she talked to a surgeon at [facility] about her back, we discussed our options here and decided to try lumbar traction after reviewing her MRI results of the lumbar spine OBJECTIVE IMPAIRMENTS: difficulty walking, decreased strength, and pain.   REHAB POTENTIAL: Good  CLINICAL DECISION MAKING: Stable/uncomplicated  EVALUATION COMPLEXITY: Low   GOALS: Goals reviewed with patient? Yes  SHORT TERM GOALS: Target date: 04/12/23  Patient will be independent with initial HEP. Goal status: 04/25/23 MET   LONG TERM GOALS: Target date: 05/17/23  Patient will be independent with advanced/ongoing HEP to improve outcomes and carryover.  Goal status: progressing 05/22/23  2.  Patient will report at least 75% improvement in R hip pain to improve QOL. Baseline: 4/10 pain /Goal status: Progressing 05/22/23  3.  Patient will demonstrate improved functional R hip strength as demonstrated by 5/5 for hip  extension and abduction. Baseline: 4-/5 Goal status: 04/25/23 MET  4.  Patient will be able to ambulate 500' with normal gait pattern and no pain  Baseline: pain with walking Goal status: 04/25/23 MET  5.  Patient will report 34 on FOTO (patient reported outcome measure) to demonstrate improved functional ability. Baseline: 41 Goal status: progressing 05/22/23   PLAN:  PT FREQUENCY: 2x/week  PT DURATION: 8 weeks  PLANNED INTERVENTIONS: Therapeutic exercises, Therapeutic activity, Neuromuscular re-education, Balance training, Gait training, Patient/Family education, Self Care, Joint mobilization, Stair training, Dry Needling, Electrical stimulation, Cryotherapy, Moist heat, Ultrasound, Ionotophoresis 4mg /ml Dexamethasone, and Manual therapy  PLAN FOR NEXT SESSION: she will be put of town the next 3 weeks on vacation, made two appointments for when she comes back to get a look at how traction does for her   Jearld Lesch, PT 05/22/2023, 9:20 AM  [facility] Rehabilitation at [facility] [address] [facility] [place], [place], [address] Phone: [phone]   Fax:  [phone]

## 2023-06-01 ENCOUNTER — Ambulatory Visit: Payer: BC Managed Care – PPO | Admitting: Sports Medicine

## 2023-06-12 ENCOUNTER — Ambulatory Visit: Payer: BC Managed Care – PPO | Attending: Internal Medicine | Admitting: Physical Therapy

## 2023-06-12 ENCOUNTER — Encounter: Payer: Self-pay | Admitting: Physical Therapy

## 2023-06-12 DIAGNOSIS — M25551 Pain in right hip: Secondary | ICD-10-CM | POA: Diagnosis not present

## 2023-06-12 DIAGNOSIS — R262 Difficulty in walking, not elsewhere classified: Secondary | ICD-10-CM | POA: Diagnosis not present

## 2023-06-12 DIAGNOSIS — M6281 Muscle weakness (generalized): Secondary | ICD-10-CM | POA: Diagnosis not present

## 2023-06-12 NOTE — Therapy (Signed)
OUTPATIENT PHYSICAL THERAPY LOWER EXTREMITY    Patient Name: Kimberly Cox MRN: [id number] DOB:[birth year], 77 y.o., female Today's Date: 06/12/2023  END OF SESSION:  PT End of Session - 06/12/23 1443     Visit Number 11    Date for PT Re-Evaluation 07/22/23    Authorization Type BCBS    PT Start Time 1443    PT Stop Time 1528    PT Time Calculation (min) 45 min    Activity Tolerance Patient tolerated treatment well    Behavior During Therapy [facility] for tasks assessed/performed               Past Medical History:  Diagnosis Date   Anxiety    Anxiety disorder    Concussion Dec 13th, 2011   fell, striking her head against a wall, developed dizziness - UC eval c/w concussion. Had follow-up and did ok.    History of chest pain    Hyperlipidemia    Hypertension    Plantar fasciitis    Spinal stenosis, lumbar    Past Surgical History:  Procedure Laterality Date   ARTHRODESIS METATARSALPHALANGEAL JOINT (MTPJ) Right 10/08/2015   Procedure: RIGHT HALLUX METATARSAL PHALANGEAL JOINT  ARTHRODESIS;  Surgeon: Toni Arthurs, MD;  Location: [facility];  Service: Orthopedics;  Laterality: Right;   caesarean section     CESAREAN SECTION     HAMMERTOE RECONSTRUCTION WITH WEIL OSTEOTOMY Right 10/08/2015   Procedure: RIGHT SECOND METATARSAL WEIL HAMMERTOE CORRECTION;  Surgeon: Toni Arthurs, MD;  Location: [facility];  Service: Orthopedics;  Laterality: Right;   TONSILLECTOMY     Patient Active Problem List   Diagnosis Date Noted   Diuretic-induced hypokalemia 03/31/2020   Sciatica of right side 05/01/2018   Piriformis syndrome of left side 11/27/2017   GERD with esophagitis 08/22/2017   Asthmatic bronchitis 08/22/2017   Prediabetes 02/02/2017   Pure hyperglyceridemia 02/02/2017   Degenerative cervical disc 01/26/2016   Degenerative lumbar disc 01/26/2016   Spinal stenosis of lumbar region with radiculopathy 01/20/2016   Routine general medical examination  at a health care facility 07/18/2014   Hereditary and idiopathic peripheral neuropathy 01/31/2008   Hyperlipidemia LDL goal <130 12/05/2007   Essential hypertension 12/05/2007    PCP: Sanda Linger  REFERRING PROVIDER: Richardean Canal  REFERRING DIAG: M25.551 (ICD-10-CM) - Pain of right hip   THERAPY DIAG:  Difficulty in walking, not elsewhere classified  Muscle weakness (generalized)  Pain in right hip  Rationale for Evaluation and Treatment: Rehabilitation  ONSET DATE: 03/03/23  SUBJECTIVE:   SUBJECTIVE STATEMENT: reports that she has been on vacation the past 3 weeks, reports that she has been in more pain recently and has contacted the surgeon at [facility].  Reports that it just seems to be there all of the time now PERTINENT HISTORY: Spinal stenosis at L3-L4 intra-articular injection gave her good relief for about a week. She is also had 2 trochanteric injections first 1 gave her some relief second 1 gave her no relief. Patient denies any groin pain. Pain mostly lateral aspect of the hip. No radicular symptoms down the leg.   PAIN:  Are you having pain? Yes: NPRS scale: 6/10 Pain location: R hip, starting to travel down the leg  Pain description: dull, ache Aggravating factors: walking, steps Relieving factors: medication   PRECAUTIONS: None  WEIGHT BEARING RESTRICTIONS: No  FALLS:  Has patient fallen in last 6 months? No  LIVING ENVIRONMENT: Lives with: lives with their spouse Lives in: House/apartment  Stairs: No Has following equipment at home: Single point cane  OCCUPATION: Retired  PLOF: Independent  PATIENT GOALS: to get rid of the pain  NEXT MD VISIT: 03/20/23  OBJECTIVE:   DIAGNOSTIC FINDINGS: MRI right hip without contrast dated 03/01/2023 is reviewed with the patient.  Images reviewed. This shows right gluteus minimus tendon with severe tendinosis.  There is also high-grade partial-thickness tear at the insertion.  Mild right gluteus medius tendinosis  without tear.  Mild right hip osteoarthritis.  Trochanteric bursitis on the right is mild.  Mild edema is seen intermuscular portion of the proximal abductor compartment muscular EXTR.  Right greater than left.  Right gluteus medius muscle edema also seen felt to reflect low-grade strains.   Impression: Right hip severe tendinosis right gluteus minimus tendon with high-grade partial-thickness tear at the insertion. Right hip osteoarthritis mild  PATIENT SURVEYS:  FOTO 41  COGNITION: Overall cognitive status: Within functional limits for tasks assessed     SENSATION: WFL  MUSCLE LENGTH: Tightness in BLE  PALPATION: She has tenderness over the right greater trochanteric region.   LOWER EXTREMITY ROM: WFL    LOWER EXTREMITY MMT:  MMT Right eval Left eval  Hip flexion 4+ 5  Hip extension 4-   Hip abduction 4-   Hip adduction    Hip internal rotation    Hip external rotation    Knee flexion 5 5  Knee extension 5 5  Ankle dorsiflexion    Ankle plantarflexion    Ankle inversion    Ankle eversion     (Blank rows = not tested)  FUNCTIONAL TESTS:  5 times sit to stand: 12.43s   TODAY'S TREATMENT:                                                                                                                              DATE:  06/12/23 Passive stretch of the LE's and trunk PPT, isometric abs MHP/IFC to the right low back and right buttock Static lumbar traction 53#  05/22/23 Reviewed MRI of the lumbar and hip area Discussed options of treatment Feet on ball K2C, rotation, small bridge and isometric abs LE stretches Static lumbar traction 50#   04/27/23 Feet on ball K2C, rotation, small bridge and isometric abs Passive stretch LE's STM with t-gun left HS, right glutes, ITB and HS HEP and performed added today  04/25/23 Pelvic ROM on sit fit 10 x 4 way Red tband 4 way scap stab on sit fit Green tband hip 3 way 12 x each Knee ext 10# 2 sets 12 HS curl 25# 2 sets  12 Walked 1000 plus feet all surfaces, good cadence Mod Dead Lift 6# 2 sets 10 Leg press 3 sets 12 feet 3 way for hips 30# Calf raises 2 sets 15 30#  04/18/23 Bike L 4 TM SW 1 1/2 min each way CGA with cuing 6 inch step up fwd with opp hip ext 10 x each  6 inch lateral step up with opp hip abd 10 x each Resisted gait 40# 5 x 4 ways Leg press 3 sets 12 feet 3 way for hips 30# ( 40 was too heavy) 10# cable pulley 10 x 4 ways BIL- ABD harder Left >RT STS on airex 10x ADD ball squeeze with bridge 10 x hold 3 sec Green tband clams 10x then 10 x with Bridge Knee ext 10# 2 sets 10 HS curl 20# 2 sets 10  04/10/23 Bike L 3 x 6 min 4in box on airex lateral step ups  Resisted gait 30# 4 way x4 HS curls 25lb 2x10 S2S holding blue ball 2x10 Hip Abd green then bridg 2x10  Bilat hip strtching all directions  04/06/23 NuStep L5x83mins  Step ups, lateral step ups 6"   Resisted gait 20# 4 way x4 STS with yellow ball 2x10 Hip abd blue seated 2x10 Ball squeeze 2x10  3 way hip with 3# 2x10 IT band and HS stretch 30s    04/04/23 NuStep L5 x68mins  Bridges 2x10 SLR 2x10  RLE SL hip abd 2x10 RLE  Clamshells green 2x10  Stretching glutes, piriformis, and HS 30s each side  Leg ext 10# 2x10  HS curls 20# 2x10  Leg press 20# 2x10 Lateral band walks green  03/23/23 NuStep L5 x 6 min Resisted Gait 20lb 4 way x 3 each Hip add ball squeeze 2x10 Hip abd blue 2x15 Leg press 20lb 2x10 Supine bridges x10 SLR x10 each Supine hip Abd x10 PROM ad stretching of LE's Ball squeeze & bridges 2x10    03/21/23 Seated abd/marches blue t-band 2x10 HS curls red t-band 2x10 R hip med/lat rot yellow t-band 2x10 Bridges 2x10 Trunk rotation 2x10 SLR R 2lb 2x10 LE PROM and stretching; emphasis on ITB Standing R hip ext and adduction red t-band, B abduction 2x10   03/15/23- EVAL    PATIENT EDUCATION:  Education details: HEP and POC Person educated: Patient Education method:  Explanation Education comprehension: verbalized understanding  HOME EXERCISE PROGRAM: Access Code: [email or web address] URL: [email or web address] Date: 04/27/2023 Prepared by: Stacie Glaze  Exercises - Seated Hamstring Stretch with Chair  - 1 x daily - 7 x weekly - 2 sets - 5 reps - 30 hold - Seated Table Hamstring Stretch  - 1 x daily - 7 x weekly - 2 sets - 5 reps - 30 hold - Supine Piriformis Stretch Pulling Heel to Hip  - 1 x daily - 7 x weekly - 2 sets - 5 reps - 15 hold - Supine Hip Adductor Stretch  - 1 x daily - 7 x weekly - 2 sets - 5 reps - 15 hold   Access Code: [email or web address] URL: [email or web address] Date: 03/15/2023 Prepared by: Cassie Freer  Exercises - Supine Bridge  - 1 x daily - 7 x weekly - 2 sets - 10 reps - Beginner Side Leg Lift  - 1 x daily - 7 x weekly - 2 sets - 10 reps - Clamshell with Resistance  - 1 x daily - 7 x weekly - 2 sets - 10 reps - Supine Piriformis Stretch with Foot on Ground  - 1 x daily - 7 x weekly - 2 reps - 15-30 hold  ASSESSMENT:  CLINICAL IMPRESSION:  Patient reports that she was on vacation the past 3 weeks, she reports that her right leg, buttock and back are really hurting.  She reports that she called the surgeon again due to it is really hurting more now OBJECTIVE IMPAIRMENTS: difficulty  walking, decreased strength, and pain.   REHAB POTENTIAL: Good  CLINICAL DECISION MAKING: Stable/uncomplicated  EVALUATION COMPLEXITY: Low   GOALS: Goals reviewed with patient? Yes  SHORT TERM GOALS: Target date: 04/12/23  Patient will be independent with initial HEP. Goal status: 04/25/23 MET   LONG TERM GOALS: Target date: 05/17/23  Patient will be independent with advanced/ongoing HEP to improve outcomes and carryover.  Goal status: progressing 06/12/23  2.  Patient will report at least 75% improvement in R hip pain to improve QOL. Baseline: 4/10 pain /Goal status: Progressing 06/12/23  3.  Patient will demonstrate  improved functional R hip strength as demonstrated by 5/5 for hip extension and abduction. Baseline: 4-/5 Goal status: 04/25/23 MET  4.  Patient will be able to ambulate 500' with normal gait pattern and no pain  Baseline: pain with walking Goal status: 04/25/23 MET  5.  Patient will report 1 on FOTO (patient reported outcome measure) to demonstrate improved functional ability. Baseline: 41 Goal status: progressing 05/22/23   PLAN:  PT FREQUENCY: 2x/week  PT DURATION: 8 weeks  PLANNED INTERVENTIONS: Therapeutic exercises, Therapeutic activity, Neuromuscular re-education, Balance training, Gait training, Patient/Family education, Self Care, Joint mobilization, Stair training, Dry Needling, Electrical stimulation, Cryotherapy, Moist heat, Ultrasound, Ionotophoresis 4mg /ml Dexamethasone, and Manual therapy  PLAN FOR NEXT SESSION: will see her another visit to see what we can do for pain.  She will try to talk with the surgeon at [facility], PT 06/12/2023, 2:44 PM  [facility] at [facility] [place], [place], [address] Phone: [phone]   Fax:  [phone]

## 2023-06-13 ENCOUNTER — Ambulatory Visit: Payer: BC Managed Care – PPO | Admitting: Sports Medicine

## 2023-06-13 ENCOUNTER — Other Ambulatory Visit (INDEPENDENT_AMBULATORY_CARE_PROVIDER_SITE_OTHER): Payer: BC Managed Care – PPO

## 2023-06-13 ENCOUNTER — Encounter: Payer: Self-pay | Admitting: Sports Medicine

## 2023-06-13 DIAGNOSIS — G8929 Other chronic pain: Secondary | ICD-10-CM

## 2023-06-13 DIAGNOSIS — M25551 Pain in right hip: Secondary | ICD-10-CM

## 2023-06-13 DIAGNOSIS — M25511 Pain in right shoulder: Secondary | ICD-10-CM

## 2023-06-13 DIAGNOSIS — S76011D Strain of muscle, fascia and tendon of right hip, subsequent encounter: Secondary | ICD-10-CM | POA: Diagnosis not present

## 2023-06-13 DIAGNOSIS — M48061 Spinal stenosis, lumbar region without neurogenic claudication: Secondary | ICD-10-CM | POA: Diagnosis not present

## 2023-06-13 DIAGNOSIS — M5416 Radiculopathy, lumbar region: Secondary | ICD-10-CM

## 2023-06-13 NOTE — Progress Notes (Signed)
Kimberly Cox - 77 y.o. female MRN [id number]  Date of birth: [birth year]  Office Visit Note: Visit Date: 06/13/2023 PCP: Etta Grandchild, MD Referred by: No ref. provider found  Subjective: Chief Complaint  Patient presents with   Right Hip - Follow-up   Right Shoulder - Pain   HPI: Kimberly Cox is a pleasant 77 y.o. female who presents today for right hip follow-up and right shoulder pain.  Right lateral hip -we did complete a number of sessions of extracorporeal shockwave therapy.  She has been doing formalized PT and home rehab.  Essentially she has minimal to no pain over the lateral hip.  She does have lumbar spinal stenosis and some chronic back pain that does radiate down the leg at times.  She received excellent relief from a lumbar ESI but this only lasted for about a week to 10 days.  She is seeing a Writer at [facility] for evaluation of this.  Right shoulder pain -she has had pain over the right shoulder for the last 2 to 3 weeks or so.  She was performing chest fly/open arm exercises at the gym.  A few days after that she had some soreness within the shoulder.  She has pain with certain reaching motions, specifically reaching back.   Pertinent ROS were reviewed with the patient and found to be negative unless otherwise specified above in HPI.   Assessment & Plan: Visit Diagnoses:  1. Acute pain of right shoulder   2. Tear of right gluteus minimus tendon, subsequent encounter   3. Pain of right hip   4. Spinal stenosis of lumbar region with radiculopathy    Plan: Discussed with Kimberly Cox from a right lateral hip standpoint, she is markedly improved from her gluteus medius tearing and gluteus medius/minimus tendinopathy.  She would benefit from continuing home exercises for hip abduction to keep the hip strength stable.  If this flares back up, we could always consider additional shockwave and/or PRP in the future, but will hold for now given her improvement.  She is going  to have her spinal stenosis and low back pain continues to be workup at [facility].  She can continue Celebrex 100 mg twice daily as needed, okay for Tylenol as well.  In terms of her right shoulder pain, I do think this was an exacerbation of the rotator cuff, although she has excellent strength so I am not concerned about a full-thickness tear at this time.  Discussed PT versus home therapy agreed upon a home exercise plan.  We did print out a customized rotator cuff and shoulder rehab exercises plan, my athletic trainer did review these in the room with her.  She will perform these once daily and I would like to see what sort of improvement she gets over the next 4 to 6 weeks.  If she is still having pain at this time, she will follow-up with me.  Could consider a one-time subacromial joint injection but I think with her therapy, time and over-the-counter anti-inflammatories this will largely improve for her.  Follow-up: Return in about 5 weeks (around 07/18/2023), or if symptoms worsen or fail to improve, for for R-shoulder.   Meds & Orders: No orders of the defined types were placed in this encounter.   Orders Placed This Encounter  Procedures   XR Shoulder Right     Procedures: No procedures performed      Clinical History: No specialty comments available.  She reports that she has  quit smoking. She has never used smokeless tobacco. No results for input(s): "HGBA1C", "LABURIC" in the last 8760 hours.  Objective:    Physical Exam  Gen: Well-appearing, in no acute distress; non-toxic CV:  Well-perfused. Warm.  Resp: Breathing unlabored on room air; no wheezing. Psych: Fluid speech in conversation; appropriate affect; normal thought process Neuro: Sensation intact throughout. No gross coordination deficits.   Ortho Exam - Right hip: There is no TTP over the lateral trochanter or the anterior aspect of the hip.  No redness or swelling.  There is no gross restriction with internal or external  logroll.  Negative FADIR.  There is still some very mild weakness with resisted hip abduction compared to the contralateral side although markedly improved from prior visits.  - Right shoulder: There is some mild pain over the anterior aspect of Codman's point.  There is full active and passive range of motion in all directions.  There is very mild pain with Gerber liftoff testing.  No gross weakness with rotator cuff provocative testing.  Imaging:  XR Shoulder Right 3 views of the right shoulder including AP, Grashey and axial view were  ordered and reviewed by myself.  There is at least moderate AC joint  arthritic change, mild glenohumeral joint arthritis.  Humeral head is well  located within the groove.  There is no acute fracture or otherwise bony  abnormality noted.    MR Hip Right w/o contrast CLINICAL DATA:  Hip pain, chronic, articular cartilage eval, xray done assess cartilage, troch bursitis, assess tendon  EXAM: MR OF THE RIGHT HIP WITHOUT CONTRAST  TECHNIQUE: Multiplanar, multisequence MR imaging was performed. No intravenous contrast was administered.  COMPARISON:  X-ray 02/06/2023  FINDINGS: Bones: No acute fracture. No dislocation. No femoral head avascular necrosis. Bony pelvis intact without diastasis. SI joints and pubic symphysis within normal limits. Degenerative lower lumbar spondylosis, not well assessed. No bone marrow edema. No marrow replacing bone lesion.  Articular cartilage and labrum  Articular cartilage: Mild chondral thinning. There is fissuring along the superior chondrolabral junction. No subchondral marrow edema.  Labrum:  Superior labral degeneration.  No paralabral cyst.  Joint or bursal effusion  Joint effusion:  None.  Bursae: Small-moderate volume right peritrochanteric bursal fluid.  Muscles and tendons  Muscles and tendons: Severe tendinosis of the right gluteus minimus tendon with high-grade partial-thickness insertional  tearing. Mild right gluteus medius tendinosis without tear. Mild tendinosis of the contralateral left gluteal tendons. The hamstring, iliopsoas, rectus femoris, and adductor tendons appear intact without tear or significant tendinosis. Mild intramuscular edema within the proximal adductor compartment musculature, right greater than left. Mild intramuscular edema within the right gluteus medius muscle.  Other findings  Miscellaneous: No soft tissue edema or fluid collection. No inguinal lymphadenopathy.  IMPRESSION: 1. Severe tendinosis of the right gluteus minimus tendon with high-grade partial-thickness insertional tearing. Mild right gluteus medius tendinosis without tear. 2. Mild-moderate right peritrochanteric bursitis. 3. Mild intramuscular edema within the proximal adductor compartment musculature, right greater than left, and right gluteus medius muscle which may reflect low-grade strains. 4. Mild right hip osteoarthritis.  Electronically Signed   By: Duanne Guess D.O.   On: 03/01/2023 13:55    Past Medical/Family/Surgical/Social History: Medications & Allergies reviewed per EMR, new medications updated. Patient Active Problem List   Diagnosis Date Noted   Diuretic-induced hypokalemia 03/31/2020   Sciatica of right side 05/01/2018   Piriformis syndrome of left side 11/27/2017   GERD with esophagitis 08/22/2017   Asthmatic bronchitis  08/22/2017   Prediabetes 02/02/2017   Pure hyperglyceridemia 02/02/2017   Degenerative cervical disc 01/26/2016   Degenerative lumbar disc 01/26/2016   Spinal stenosis of lumbar region with radiculopathy 01/20/2016   Routine general medical examination at a health care facility 07/18/2014   Hereditary and idiopathic peripheral neuropathy 01/31/2008   Hyperlipidemia LDL goal <130 12/05/2007   Essential hypertension 12/05/2007   Past Medical History:  Diagnosis Date   Anxiety    Anxiety disorder    Concussion Dec 13th, 2011    fell, striking her head against a wall, developed dizziness - UC eval c/w concussion. Had follow-up and did ok.    History of chest pain    Hyperlipidemia    Hypertension    Plantar fasciitis    Spinal stenosis, lumbar    Family History  Problem Relation Age of Onset   Cancer Mother    Coronary artery disease Father    Past Surgical History:  Procedure Laterality Date   ARTHRODESIS METATARSALPHALANGEAL JOINT (MTPJ) Right 10/08/2015   Procedure: RIGHT HALLUX METATARSAL PHALANGEAL JOINT  ARTHRODESIS;  Surgeon: Toni Arthurs, MD;  Location: [facility];  Service: Orthopedics;  Laterality: Right;   caesarean section     CESAREAN SECTION     HAMMERTOE RECONSTRUCTION WITH WEIL OSTEOTOMY Right 10/08/2015   Procedure: RIGHT SECOND METATARSAL WEIL HAMMERTOE CORRECTION;  Surgeon: Toni Arthurs, MD;  Location: [facility];  Service: Orthopedics;  Laterality: Right;   TONSILLECTOMY     Social History   Occupational History   Not on file  Tobacco Use   Smoking status: Former   Smokeless tobacco: Never   Tobacco comments:    only in h.s.  Vaping Use   Vaping status: Never Used  Substance and Sexual Activity   Alcohol use: Yes    Comment: occ   Drug use: No   Sexual activity: Not Currently    Birth control/protection: Post-menopausal    Comment: 1st intercourse- 20, partners- 1

## 2023-06-13 NOTE — Progress Notes (Signed)
Patient says that her hip is feeling better. She says that she does have symptoms that seem to be coming more from her back, which she says she is likely moving forward with surgery.  Patient says that a few weeks ago she was using an exercise machine that she did not adjust the settings on. She says that her arms started way behind her and she brought them in (adduction). A few days after that she began having pain in the shoulder, particularly when raising her arms or pushing (pushing open the passenger door of the car). She says that she will feel popping when she raises her arm. Patient has been taking Advil for her back but nothing specifically for her shoulder.  Patient was instructed in 10 minutes of therapeutic exercises for right shoulder to improve strength, ROM and function according to my instructions and plan of care by a Certified Athletic Trainer during the office visit. A customized handout was provided and demonstration of proper technique shown and discussed. Patient did perform exercises and demonstrate understanding through teachback.  All questions discussed and answered.

## 2023-06-15 ENCOUNTER — Encounter: Payer: Self-pay | Admitting: Physical Therapy

## 2023-06-15 ENCOUNTER — Ambulatory Visit: Payer: BC Managed Care – PPO | Admitting: Physical Therapy

## 2023-06-15 DIAGNOSIS — M25551 Pain in right hip: Secondary | ICD-10-CM

## 2023-06-15 DIAGNOSIS — R262 Difficulty in walking, not elsewhere classified: Secondary | ICD-10-CM | POA: Diagnosis not present

## 2023-06-15 DIAGNOSIS — M6281 Muscle weakness (generalized): Secondary | ICD-10-CM | POA: Diagnosis not present

## 2023-06-15 NOTE — Therapy (Signed)
OUTPATIENT PHYSICAL THERAPY LOWER EXTREMITY    Patient Name: Kimberly Cox MRN: [id number] DOB:[birth year], 77 y.o., female Today's Date: 06/15/2023  END OF SESSION:  PT End of Session - 06/15/23 1445     Visit Number 12    Date for PT Re-Evaluation 07/22/23    Authorization Type BCBS    PT Start Time 1444    PT Stop Time 1527    PT Time Calculation (min) 43 min    Activity Tolerance Patient tolerated treatment well    Behavior During Therapy [facility] for tasks assessed/performed               Past Medical History:  Diagnosis Date   Anxiety    Anxiety disorder    Concussion Dec 13th, 2011   fell, striking her head against a wall, developed dizziness - UC eval c/w concussion. Had follow-up and did ok.    History of chest pain    Hyperlipidemia    Hypertension    Plantar fasciitis    Spinal stenosis, lumbar    Past Surgical History:  Procedure Laterality Date   ARTHRODESIS METATARSALPHALANGEAL JOINT (MTPJ) Right 10/08/2015   Procedure: RIGHT HALLUX METATARSAL PHALANGEAL JOINT  ARTHRODESIS;  Surgeon: Toni Arthurs, MD;  Location: [facility];  Service: Orthopedics;  Laterality: Right;   caesarean section     CESAREAN SECTION     HAMMERTOE RECONSTRUCTION WITH WEIL OSTEOTOMY Right 10/08/2015   Procedure: RIGHT SECOND METATARSAL WEIL HAMMERTOE CORRECTION;  Surgeon: Toni Arthurs, MD;  Location: [facility];  Service: Orthopedics;  Laterality: Right;   TONSILLECTOMY     Patient Active Problem List   Diagnosis Date Noted   Diuretic-induced hypokalemia 03/31/2020   Sciatica of right side 05/01/2018   Piriformis syndrome of left side 11/27/2017   GERD with esophagitis 08/22/2017   Asthmatic bronchitis 08/22/2017   Prediabetes 02/02/2017   Pure hyperglyceridemia 02/02/2017   Degenerative cervical disc 01/26/2016   Degenerative lumbar disc 01/26/2016   Spinal stenosis of lumbar region with radiculopathy 01/20/2016   Routine general medical examination  at a health care facility 07/18/2014   Hereditary and idiopathic peripheral neuropathy 01/31/2008   Hyperlipidemia LDL goal <130 12/05/2007   Essential hypertension 12/05/2007    PCP: Sanda Linger  REFERRING PROVIDER: Richardean Canal  REFERRING DIAG: M25.551 (ICD-10-CM) - Pain of right hip   THERAPY DIAG:  Difficulty in walking, not elsewhere classified  Muscle weakness (generalized)  Pain in right hip  Rationale for Evaluation and Treatment: Rehabilitation  ONSET DATE: 03/03/23  SUBJECTIVE:   SUBJECTIVE STATEMENT: Reports that she has talked with the MD at [facility], has na MRI Nov 7th.  She brings in her TENS, and thought that the traction last time helped for that day PERTINENT HISTORY: Spinal stenosis at L3-L4 intra-articular injection gave her good relief for about a week. She is also had 2 trochanteric injections first 1 gave her some relief second 1 gave her no relief. Patient denies any groin pain. Pain mostly lateral aspect of the hip. No radicular symptoms down the leg.   PAIN:  Are you having pain? Yes: NPRS scale: 6/10 Pain location: R hip, starting to travel down the leg  Pain description: dull, ache Aggravating factors: walking, steps Relieving factors: medication   PRECAUTIONS: None  WEIGHT BEARING RESTRICTIONS: No  FALLS:  Has patient fallen in last 6 months? No  LIVING ENVIRONMENT: Lives with: lives with their spouse Lives in: House/apartment Stairs: No Has following equipment at home: Single point  cane  OCCUPATION: Retired  PLOF: Independent  PATIENT GOALS: to get rid of the pain  NEXT MD VISIT: 03/20/23  OBJECTIVE:   DIAGNOSTIC FINDINGS: MRI right hip without contrast dated 03/01/2023 is reviewed with the patient.  Images reviewed. This shows right gluteus minimus tendon with severe tendinosis.  There is also high-grade partial-thickness tear at the insertion.  Mild right gluteus medius tendinosis without tear.  Mild right hip osteoarthritis.   Trochanteric bursitis on the right is mild.  Mild edema is seen intermuscular portion of the proximal abductor compartment muscular EXTR.  Right greater than left.  Right gluteus medius muscle edema also seen felt to reflect low-grade strains.   Impression: Right hip severe tendinosis right gluteus minimus tendon with high-grade partial-thickness tear at the insertion. Right hip osteoarthritis mild  PATIENT SURVEYS:  FOTO 41  COGNITION: Overall cognitive status: Within functional limits for tasks assessed     SENSATION: WFL  MUSCLE LENGTH: Tightness in BLE  PALPATION: She has tenderness over the right greater trochanteric region.   LOWER EXTREMITY ROM: WFL    LOWER EXTREMITY MMT:  MMT Right eval Left eval  Hip flexion 4+ 5  Hip extension 4-   Hip abduction 4-   Hip adduction    Hip internal rotation    Hip external rotation    Knee flexion 5 5  Knee extension 5 5  Ankle dorsiflexion    Ankle plantarflexion    Ankle inversion    Ankle eversion     (Blank rows = not tested)  FUNCTIONAL TESTS:  5 times sit to stand: 12.43s   TODAY'S TREATMENT:                                                                                                                              DATE:  06/15/23 Patient brought in TENS, I educated on use, placement, batteries, modes, time, precautions, contraindications , placed it on her and tried the different modes, then had her return demo Passive stretch of the LE's PPT Isometric abs Ball b/n knees Static lumbar traction 53#  06/12/23 Passive stretch of the LE's and trunk PPT, isometric abs MHP/IFC to the right low back and right buttock Static lumbar traction 53#  05/22/23 Reviewed MRI of the lumbar and hip area Discussed options of treatment Feet on ball K2C, rotation, small bridge and isometric abs LE stretches Static lumbar traction 50#   04/27/23 Feet on ball K2C, rotation, small bridge and isometric abs Passive stretch  LE's STM with t-gun left HS, right glutes, ITB and HS HEP and performed added today  04/25/23 Pelvic ROM on sit fit 10 x 4 way Red tband 4 way scap stab on sit fit Green tband hip 3 way 12 x each Knee ext 10# 2 sets 12 HS curl 25# 2 sets 12 Walked 1000 plus feet all surfaces, good cadence Mod Dead Lift 6# 2 sets 10 Leg press 3 sets 12 feet 3 way  for hips 30# Calf raises 2 sets 15 30#  04/18/23 Bike L 4 TM SW 1 1/2 min each way CGA with cuing 6 inch step up fwd with opp hip ext 10 x each 6 inch lateral step up with opp hip abd 10 x each Resisted gait 40# 5 x 4 ways Leg press 3 sets 12 feet 3 way for hips 30# ( 40 was too heavy) 10# cable pulley 10 x 4 ways BIL- ABD harder Left >RT STS on airex 10x ADD ball squeeze with bridge 10 x hold 3 sec Green tband clams 10x then 10 x with Bridge Knee ext 10# 2 sets 10 HS curl 20# 2 sets 10  04/10/23 Bike L 3 x 6 min 4in box on airex lateral step ups  Resisted gait 30# 4 way x4 HS curls 25lb 2x10 S2S holding blue ball 2x10 Hip Abd green then bridg 2x10  Bilat hip strtching all directions  04/06/23 NuStep L5x53mins  Step ups, lateral step ups 6"   Resisted gait 20# 4 way x4 STS with yellow ball 2x10 Hip abd blue seated 2x10 Ball squeeze 2x10  3 way hip with 3# 2x10 IT band and HS stretch 30s    04/04/23 NuStep L5 x75mins  Bridges 2x10 SLR 2x10  RLE SL hip abd 2x10 RLE  Clamshells green 2x10  Stretching glutes, piriformis, and HS 30s each side  Leg ext 10# 2x10  HS curls 20# 2x10  Leg press 20# 2x10 Lateral band walks green  03/23/23 NuStep L5 x 6 min Resisted Gait 20lb 4 way x 3 each Hip add ball squeeze 2x10 Hip abd blue 2x15 Leg press 20lb 2x10 Supine bridges x10 SLR x10 each Supine hip Abd x10 PROM ad stretching of LE's Ball squeeze & bridges 2x10   PATIENT EDUCATION:  Education details: HEP and POC Person educated: Patient Education method: Explanation Education comprehension: verbalized  understanding  HOME EXERCISE PROGRAM: Access Code: [email or web address] URL: [email or web address] Date: 04/27/2023 Prepared by: Stacie Glaze  Exercises - Seated Hamstring Stretch with Chair  - 1 x daily - 7 x weekly - 2 sets - 5 reps - 30 hold - Seated Table Hamstring Stretch  - 1 x daily - 7 x weekly - 2 sets - 5 reps - 30 hold - Supine Piriformis Stretch Pulling Heel to Hip  - 1 x daily - 7 x weekly - 2 sets - 5 reps - 15 hold - Supine Hip Adductor Stretch  - 1 x daily - 7 x weekly - 2 sets - 5 reps - 15 hold   Access Code: [email or web address] URL: [email or web address] Date: 03/15/2023 Prepared by: Cassie Freer  Exercises - Supine Bridge  - 1 x daily - 7 x weekly - 2 sets - 10 reps - Beginner Side Leg Lift  - 1 x daily - 7 x weekly - 2 sets - 10 reps - Clamshell with Resistance  - 1 x daily - 7 x weekly - 2 sets - 10 reps - Supine Piriformis Stretch with Foot on Ground  - 1 x daily - 7 x weekly - 2 reps - 15-30 hold  ASSESSMENT:  CLINICAL IMPRESSION:  Patient will have MRI in November 7.  She brought in TENS, educated her in its safe use, she was able to return demo the safe use and asked good questions that I answered.  Tried the traction again since it did seem to help OBJECTIVE IMPAIRMENTS: difficulty walking, decreased strength, and pain.   REHAB POTENTIAL: Good  CLINICAL DECISION MAKING: Stable/uncomplicated  EVALUATION COMPLEXITY: Low   GOALS: Goals reviewed with patient? Yes  SHORT TERM GOALS: Target date: 04/12/23  Patient will be independent with initial HEP. Goal status: 04/25/23 MET   LONG TERM GOALS: Target date: 05/17/23  Patient will be independent with advanced/ongoing HEP to improve outcomes and carryover.  Goal status: progressing 06/12/23  2.  Patient will report at least 75% improvement in R hip pain to improve QOL. Baseline: 4/10 pain /Goal status: Progressing 06/12/23  3.  Patient will demonstrate improved functional R hip strength  as demonstrated by 5/5 for hip extension and abduction. Baseline: 4-/5 Goal status: 04/25/23 MET  4.  Patient will be able to ambulate 500' with normal gait pattern and no pain  Baseline: pain with walking Goal status: 04/25/23 MET  5.  Patient will report 3 on FOTO (patient reported outcome measure) to demonstrate improved functional ability. Baseline: 41 Goal status: progressing 05/22/23   PLAN:  PT FREQUENCY: 2x/week  PT DURATION: 8 weeks  PLANNED INTERVENTIONS: Therapeutic exercises, Therapeutic activity, Neuromuscular re-education, Balance training, Gait training, Patient/Family education, Self Care, Joint mobilization, Stair training, Dry Needling, Electrical stimulation, Cryotherapy, Moist heat, Ultrasound, Ionotophoresis 4mg /ml Dexamethasone, and Manual therapy  PLAN FOR NEXT SESSION: will continue with traction and core stability to see if we can help her   Jearld Lesch, PT 06/15/2023, 2:46 PM  [facility] [facility] at [facility] [address] [facility]. [place], [place], [address] Phone: [phone]   Fax:  [phone]

## 2023-06-26 ENCOUNTER — Ambulatory Visit: Payer: BC Managed Care – PPO | Admitting: Physical Therapy

## 2023-06-26 ENCOUNTER — Encounter: Payer: Self-pay | Admitting: Physical Therapy

## 2023-06-26 DIAGNOSIS — M6281 Muscle weakness (generalized): Secondary | ICD-10-CM

## 2023-06-26 DIAGNOSIS — M25551 Pain in right hip: Secondary | ICD-10-CM

## 2023-06-26 DIAGNOSIS — R262 Difficulty in walking, not elsewhere classified: Secondary | ICD-10-CM | POA: Diagnosis not present

## 2023-06-26 NOTE — Therapy (Signed)
OUTPATIENT PHYSICAL THERAPY LOWER EXTREMITY    Patient Name: Kimberly Cox MRN: [id number] DOB:May 04, 1946, 77 y.o., female Today's Date: 06/26/2023  END OF SESSION:  PT End of Session - 06/26/23 1450     Visit Number 13    Date for PT Re-Evaluation 07/22/23    Authorization Type BCBS    PT Start Time 1444    PT Stop Time 1527    PT Time Calculation (min) 43 min    Activity Tolerance Patient tolerated treatment well    Behavior During Therapy [facility] for tasks assessed/performed               Past Medical History:  Diagnosis Date   Anxiety    Anxiety disorder    Concussion Dec 13th, 2011   fell, striking her head against a wall, developed dizziness - UC eval c/w concussion. Had follow-up and did ok.    History of chest pain    Hyperlipidemia    Hypertension    Plantar fasciitis    Spinal stenosis, lumbar    Past Surgical History:  Procedure Laterality Date   ARTHRODESIS METATARSALPHALANGEAL JOINT (MTPJ) Right 10/08/2015   Procedure: RIGHT HALLUX METATARSAL PHALANGEAL JOINT  ARTHRODESIS;  Surgeon: Toni Arthurs, MD;  Location: [facility];  Service: Orthopedics;  Laterality: Right;   caesarean section     CESAREAN SECTION     HAMMERTOE RECONSTRUCTION WITH WEIL OSTEOTOMY Right 10/08/2015   Procedure: RIGHT SECOND METATARSAL WEIL HAMMERTOE CORRECTION;  Surgeon: Toni Arthurs, MD;  Location: [facility];  Service: Orthopedics;  Laterality: Right;   TONSILLECTOMY     Patient Active Problem List   Diagnosis Date Noted   Diuretic-induced hypokalemia 03/31/2020   Sciatica of right side 05/01/2018   Piriformis syndrome of left side 11/27/2017   GERD with esophagitis 08/22/2017   Asthmatic bronchitis 08/22/2017   Prediabetes 02/02/2017   Pure hyperglyceridemia 02/02/2017   Degenerative cervical disc 01/26/2016   Degenerative lumbar disc 01/26/2016   Spinal stenosis of lumbar region with radiculopathy 01/20/2016   Routine general medical examination  at a health care facility 07/18/2014   Hereditary and idiopathic peripheral neuropathy 01/31/2008   Hyperlipidemia LDL goal <130 12/05/2007   Essential hypertension 12/05/2007    PCP: Sanda Linger  REFERRING PROVIDER: Richardean Canal  REFERRING DIAG: M25.551 (ICD-10-CM) - Pain of right hip   THERAPY DIAG:  Difficulty in walking, not elsewhere classified  Muscle weakness (generalized)  Pain in right hip  Rationale for Evaluation and Treatment: Rehabilitation  ONSET DATE: 03/03/23  SUBJECTIVE:   SUBJECTIVE STATEMENT: Using TENS, feels like the traction helps for a few days.  Pain in the right buttock with walking PERTINENT HISTORY: Spinal stenosis at L3-L4 intra-articular injection gave her good relief for about a week. She is also had 2 trochanteric injections first 1 gave her some relief second 1 gave her no relief. Patient denies any groin pain. Pain mostly lateral aspect of the hip. No radicular symptoms down the leg.   PAIN:  Are you having pain? Yes: NPRS scale: 6/10 Pain location: R hip, starting to travel down the leg  Pain description: dull, ache Aggravating factors: walking, steps Relieving factors: medication   PRECAUTIONS: None  WEIGHT BEARING RESTRICTIONS: No  FALLS:  Has patient fallen in last 6 months? No  LIVING ENVIRONMENT: Lives with: lives with their spouse Lives in: House/apartment Stairs: No Has following equipment at home: Single point cane  OCCUPATION: Retired  PLOF: Independent  PATIENT GOALS: to get rid  of the pain  NEXT MD VISIT: 03/20/23  OBJECTIVE:   DIAGNOSTIC FINDINGS: MRI right hip without contrast dated 03/01/2023 is reviewed with the patient.  Images reviewed. This shows right gluteus minimus tendon with severe tendinosis.  There is also high-grade partial-thickness tear at the insertion.  Mild right gluteus medius tendinosis without tear.  Mild right hip osteoarthritis.  Trochanteric bursitis on the right is mild.  Mild edema is  seen intermuscular portion of the proximal abductor compartment muscular EXTR.  Right greater than left.  Right gluteus medius muscle edema also seen felt to reflect low-grade strains.   Impression: Right hip severe tendinosis right gluteus minimus tendon with high-grade partial-thickness tear at the insertion. Right hip osteoarthritis mild  PATIENT SURVEYS:  FOTO 41  COGNITION: Overall cognitive status: Within functional limits for tasks assessed     SENSATION: WFL  MUSCLE LENGTH: Tightness in BLE  PALPATION: She has tenderness over the right greater trochanteric region.   LOWER EXTREMITY ROM: WFL    LOWER EXTREMITY MMT:  MMT Right eval Left eval  Hip flexion 4+ 5  Hip extension 4-   Hip abduction 4-   Hip adduction    Hip internal rotation    Hip external rotation    Knee flexion 5 5  Knee extension 5 5  Ankle dorsiflexion    Ankle plantarflexion    Ankle inversion    Ankle eversion     (Blank rows = not tested)  FUNCTIONAL TESTS:  5 times sit to stand: 12.43s   TODAY'S TREATMENT:                                                                                                                              DATE:  06/26/23 Bike level 4 x 5 minutes 5# straight arm pulls caused pain being tight in the core  5# AR press again pain  Feet on ball K2C, trunk rotation, isometric abs Passive LE stretches Static lumbar traction 55#  06/15/23 Patient brought in TENS, I educated on use, placement, batteries, modes, time, precautions, contraindications , placed it on her and tried the different modes, then had her return demo Passive stretch of the LE's PPT Isometric abs Ball b/n knees Static lumbar traction 53#  06/12/23 Passive stretch of the LE's and trunk PPT, isometric abs MHP/IFC to the right low back and right buttock Static lumbar traction 53#  05/22/23 Reviewed MRI of the lumbar and hip area Discussed options of treatment Feet on ball K2C, rotation,  small bridge and isometric abs LE stretches Static lumbar traction 50#   04/27/23 Feet on ball K2C, rotation, small bridge and isometric abs Passive stretch LE's STM with t-gun left HS, right glutes, ITB and HS HEP and performed added today  04/25/23 Pelvic ROM on sit fit 10 x 4 way Red tband 4 way scap stab on sit fit Green tband hip 3 way 12 x each Knee ext 10# 2 sets 12 HS  curl 25# 2 sets 12 Walked 1000 plus feet all surfaces, good cadence Mod Dead Lift 6# 2 sets 10 Leg press 3 sets 12 feet 3 way for hips 30# Calf raises 2 sets 15 30#  04/18/23 Bike L 4 TM SW 1 1/2 min each way CGA with cuing 6 inch step up fwd with opp hip ext 10 x each 6 inch lateral step up with opp hip abd 10 x each Resisted gait 40# 5 x 4 ways Leg press 3 sets 12 feet 3 way for hips 30# ( 40 was too heavy) 10# cable pulley 10 x 4 ways BIL- ABD harder Left >RT STS on airex 10x ADD ball squeeze with bridge 10 x hold 3 sec Green tband clams 10x then 10 x with Bridge Knee ext 10# 2 sets 10 HS curl 20# 2 sets 10  04/10/23 Bike L 3 x 6 min 4in box on airex lateral step ups  Resisted gait 30# 4 way x4 HS curls 25lb 2x10 S2S holding blue ball 2x10 Hip Abd green then bridg 2x10  Bilat hip strtching all directions  04/06/23 NuStep L5x3mins  Step ups, lateral step ups 6"   Resisted gait 20# 4 way x4 STS with yellow ball 2x10 Hip abd blue seated 2x10 Ball squeeze 2x10  3 way hip with 3# 2x10 IT band and HS stretch 30s   PATIENT EDUCATION:  Education details: HEP and POC Person educated: Patient Education method: Explanation Education comprehension: verbalized understanding  HOME EXERCISE PROGRAM: Access Code: [email or web address] URL: [email or web address] Date: 04/27/2023 Prepared by: Stacie Glaze  Exercises - Seated Hamstring Stretch with Chair  - 1 x daily - 7 x weekly - 2 sets - 5 reps - 30 hold - Seated Table Hamstring Stretch  - 1 x daily - 7 x weekly - 2 sets - 5 reps - 30  hold - Supine Piriformis Stretch Pulling Heel to Hip  - 1 x daily - 7 x weekly - 2 sets - 5 reps - 15 hold - Supine Hip Adductor Stretch  - 1 x daily - 7 x weekly - 2 sets - 5 reps - 15 hold   Access Code: [email or web address] URL: [email or web address] Date: 03/15/2023 Prepared by: Cassie Freer  Exercises - Supine Bridge  - 1 x daily - 7 x weekly - 2 sets - 10 reps - Beginner Side Leg Lift  - 1 x daily - 7 x weekly - 2 sets - 10 reps - Clamshell with Resistance  - 1 x daily - 7 x weekly - 2 sets - 10 reps - Supine Piriformis Stretch with Foot on Ground  - 1 x daily - 7 x weekly - 2 reps - 15-30 hold  ASSESSMENT:  CLINICAL IMPRESSION:  Patient will have MRI in November 7.  She feels like the traction gives her some relief for a day or two, she does have pain with tightening abs, with the straight arm pulls, posture correction, AR press and bridges all increase LBP. OBJECTIVE IMPAIRMENTS: difficulty walking, decreased strength, and pain.   REHAB POTENTIAL: Good  CLINICAL DECISION MAKING: Stable/uncomplicated  EVALUATION COMPLEXITY: Low   GOALS: Goals reviewed with patient? Yes  SHORT TERM GOALS: Target date: 04/12/23  Patient will be independent with initial HEP. Goal status: 04/25/23 MET   LONG TERM GOALS: Target date: 05/17/23  Patient will be independent with advanced/ongoing HEP to improve outcomes and carryover.  Goal status: progressing 06/12/23  2.  Patient will report at least 75% improvement in R  hip pain to improve QOL. Baseline: 4/10 pain /Goal status: Progressing 06/26/23  3.  Patient will demonstrate improved functional R hip strength as demonstrated by 5/5 for hip extension and abduction. Baseline: 4-/5 Goal status: 04/25/23 MET  4.  Patient will be able to ambulate 500' with normal gait pattern and no pain  Baseline: pain with walking Goal status: 04/25/23 MET  5.  Patient will report 28 on FOTO (patient reported outcome measure) to demonstrate improved  functional ability. Baseline: 41 Goal status: progressing 06/26/23   PLAN:  PT FREQUENCY: 2x/week  PT DURATION: 8 weeks  PLANNED INTERVENTIONS: Therapeutic exercises, Therapeutic activity, Neuromuscular re-education, Balance training, Gait training, Patient/Family education, Self Care, Joint mobilization, Stair training, Dry Needling, Electrical stimulation, Cryotherapy, Moist heat, Ultrasound, Ionotophoresis 4mg /ml Dexamethasone, and Manual therapy  PLAN FOR NEXT SESSION: will continue with traction and core stability to see if we can help her, she has MRI on November 7th   Dilon Lank W, PT 06/26/2023, 2:51 PM  [place] [facility] at [facility] [address] [facility]. [place], [place], [address] Phone: [phone]   Fax:  [phone]

## 2023-06-28 ENCOUNTER — Encounter: Payer: Self-pay | Admitting: Physical Therapy

## 2023-06-28 ENCOUNTER — Ambulatory Visit: Payer: BC Managed Care – PPO | Admitting: Physical Therapy

## 2023-06-28 DIAGNOSIS — M6281 Muscle weakness (generalized): Secondary | ICD-10-CM

## 2023-06-28 DIAGNOSIS — M25551 Pain in right hip: Secondary | ICD-10-CM | POA: Diagnosis not present

## 2023-06-28 DIAGNOSIS — R262 Difficulty in walking, not elsewhere classified: Secondary | ICD-10-CM | POA: Diagnosis not present

## 2023-06-28 NOTE — Therapy (Signed)
OUTPATIENT PHYSICAL THERAPY LOWER EXTREMITY    Patient Name: Kimberly Cox MRN: [id number] DOB:Sep 30, 1945, 77 y.o., female Today's Date: 06/28/2023  END OF SESSION:  PT End of Session - 06/28/23 0947     Visit Number 14    Date for PT Re-Evaluation 07/22/23    Authorization Type BCBS    PT Start Time 0930    PT Stop Time 1015    PT Time Calculation (min) 45 min    Activity Tolerance Patient tolerated treatment well    Behavior During Therapy [facility] for tasks assessed/performed               Past Medical History:  Diagnosis Date   Anxiety    Anxiety disorder    Concussion Dec 13th, 2011   fell, striking her head against a wall, developed dizziness - UC eval c/w concussion. Had follow-up and did ok.    History of chest pain    Hyperlipidemia    Hypertension    Plantar fasciitis    Spinal stenosis, lumbar    Past Surgical History:  Procedure Laterality Date   ARTHRODESIS METATARSALPHALANGEAL JOINT (MTPJ) Right 10/08/2015   Procedure: RIGHT HALLUX METATARSAL PHALANGEAL JOINT  ARTHRODESIS;  Surgeon: Toni Arthurs, MD;  Location: [facility];  Service: Orthopedics;  Laterality: Right;   caesarean section     CESAREAN SECTION     HAMMERTOE RECONSTRUCTION WITH WEIL OSTEOTOMY Right 10/08/2015   Procedure: RIGHT SECOND METATARSAL WEIL HAMMERTOE CORRECTION;  Surgeon: Toni Arthurs, MD;  Location: [facility];  Service: Orthopedics;  Laterality: Right;   TONSILLECTOMY     Patient Active Problem List   Diagnosis Date Noted   Diuretic-induced hypokalemia 03/31/2020   Sciatica of right side 05/01/2018   Piriformis syndrome of left side 11/27/2017   GERD with esophagitis 08/22/2017   Asthmatic bronchitis 08/22/2017   Prediabetes 02/02/2017   Pure hyperglyceridemia 02/02/2017   Degenerative cervical disc 01/26/2016   Degenerative lumbar disc 01/26/2016   Spinal stenosis of lumbar region with radiculopathy 01/20/2016   Routine general medical examination  at a health care facility 07/18/2014   Hereditary and idiopathic peripheral neuropathy 01/31/2008   Hyperlipidemia LDL goal <130 12/05/2007   Essential hypertension 12/05/2007    PCP: Sanda Linger  REFERRING PROVIDER: Richardean Canal  REFERRING DIAG: M25.551 (ICD-10-CM) - Pain of right hip   THERAPY DIAG:  Difficulty in walking, not elsewhere classified  Muscle weakness (generalized)  Pain in right hip  Rationale for Evaluation and Treatment: Rehabilitation  ONSET DATE: 03/03/23  SUBJECTIVE:   SUBJECTIVE STATEMENT: continues to get good relief with the traction just does not last long PERTINENT HISTORY: Spinal stenosis at L3-L4 intra-articular injection gave her good relief for about a week. She is also had 2 trochanteric injections first 1 gave her some relief second 1 gave her no relief. Patient denies any groin pain. Pain mostly lateral aspect of the hip. No radicular symptoms down the leg.   PAIN:  Are you having pain? Yes: NPRS scale: 4/10 Pain location: R hip, starting to travel down the leg  Pain description: dull, ache Aggravating factors: walking, steps Relieving factors: medication   PRECAUTIONS: None  WEIGHT BEARING RESTRICTIONS: No  FALLS:  Has patient fallen in last 6 months? No  LIVING ENVIRONMENT: Lives with: lives with their spouse Lives in: House/apartment Stairs: No Has following equipment at home: Single point cane  OCCUPATION: Retired  PLOF: Independent  PATIENT GOALS: to get rid of the pain  NEXT MD  VISIT: 03/20/23  OBJECTIVE:   DIAGNOSTIC FINDINGS: MRI right hip without contrast dated 03/01/2023 is reviewed with the patient.  Images reviewed. This shows right gluteus minimus tendon with severe tendinosis.  There is also high-grade partial-thickness tear at the insertion.  Mild right gluteus medius tendinosis without tear.  Mild right hip osteoarthritis.  Trochanteric bursitis on the right is mild.  Mild edema is seen intermuscular portion  of the proximal abductor compartment muscular EXTR.  Right greater than left.  Right gluteus medius muscle edema also seen felt to reflect low-grade strains.   Impression: Right hip severe tendinosis right gluteus minimus tendon with high-grade partial-thickness tear at the insertion. Right hip osteoarthritis mild  PATIENT SURVEYS:  FOTO 41  COGNITION: Overall cognitive status: Within functional limits for tasks assessed     SENSATION: WFL  MUSCLE LENGTH: Tightness in BLE  PALPATION: She has tenderness over the right greater trochanteric region.   LOWER EXTREMITY ROM: WFL    LOWER EXTREMITY MMT:  MMT Right eval Left eval  Hip flexion 4+ 5  Hip extension 4-   Hip abduction 4-   Hip adduction    Hip internal rotation    Hip external rotation    Knee flexion 5 5  Knee extension 5 5  Ankle dorsiflexion    Ankle plantarflexion    Ankle inversion    Ankle eversion     (Blank rows = not tested)  FUNCTIONAL TESTS:  5 times sit to stand: 12.43s   TODAY'S TREATMENT:                                                                                                                              DATE:  06/28/23 Updated HEP and performed including supine ball exercises Passive stretch Lumbar traction static 55# Review and demo of all proper ADL's with body mechanic instruction for lifting, bed mobility, vacuum, dishes, golfers lift etc..  06/26/23 Bike level 4 x 5 minutes 5# straight arm pulls caused pain being tight in the core  5# AR press again pain  Feet on ball K2C, trunk rotation, isometric abs Passive LE stretches Static lumbar traction 55#  06/15/23 Patient brought in TENS, I educated on use, placement, batteries, modes, time, precautions, contraindications , placed it on her and tried the different modes, then had her return demo Passive stretch of the LE's PPT Isometric abs Ball b/n knees Static lumbar traction 53#  06/12/23 Passive stretch of the LE's and  trunk PPT, isometric abs MHP/IFC to the right low back and right buttock Static lumbar traction 53#  05/22/23 Reviewed MRI of the lumbar and hip area Discussed options of treatment Feet on ball K2C, rotation, small bridge and isometric abs LE stretches Static lumbar traction 50#   04/27/23 Feet on ball K2C, rotation, small bridge and isometric abs Passive stretch LE's STM with t-gun left HS, right glutes, ITB and HS HEP and performed added today  04/25/23 Pelvic ROM on  sit fit 10 x 4 way Red tband 4 way scap stab on sit fit Green tband hip 3 way 12 x each Knee ext 10# 2 sets 12 HS curl 25# 2 sets 12 Walked 1000 plus feet all surfaces, good cadence Mod Dead Lift 6# 2 sets 10 Leg press 3 sets 12 feet 3 way for hips 30# Calf raises 2 sets 15 30#  04/18/23 Bike L 4 TM SW 1 1/2 min each way CGA with cuing 6 inch step up fwd with opp hip ext 10 x each 6 inch lateral step up with opp hip abd 10 x each Resisted gait 40# 5 x 4 ways Leg press 3 sets 12 feet 3 way for hips 30# ( 40 was too heavy) 10# cable pulley 10 x 4 ways BIL- ABD harder Left >RT STS on airex 10x ADD ball squeeze with bridge 10 x hold 3 sec Green tband clams 10x then 10 x with Bridge Knee ext 10# 2 sets 10 HS curl 20# 2 sets 10  PATIENT EDUCATION:  Education details: HEP and POC Person educated: Patient Education method: Explanation Education comprehension: verbalized understanding  HOME EXERCISE PROGRAM: Access Code: [email or web address] URL: [email or web address] Date: 06/28/2023 Prepared by: Stacie Glaze  Exercises - Supine Bridge  - 1 x daily - 7 x weekly - 2 sets - 10 reps - 3 hold - Supine Active Straight Leg Raise  - 1 x daily - 7 x weekly - 2 sets - 10 reps - 3 hold - Standing Row with Anchored Resistance  - 1 x daily - 7 x weekly - 2 sets - 10 reps - 3 hold - Shoulder Extension with Resistance  - 1 x daily - 7 x weekly - 2 sets - 10 reps - 3 hold - Standing Repeated Hip Extension  with Resistance  - 1 x daily - 7 x weekly - 2 sets - 10 reps - 3 hold - Standing Repeated Hip Abduction with Resistance  - 1 x daily - 7 x weekly - 2 sets - 10 reps - 3 hold - Supine March  - 1 x daily - 7 x weekly - 2 sets - 10 reps - 3 hold   Access Code: [email or web address] URL: [email or web address] Date: 04/27/2023 Prepared by: Stacie Glaze  Exercises - Seated Hamstring Stretch with Chair  - 1 x daily - 7 x weekly - 2 sets - 5 reps - 30 hold - Seated Table Hamstring Stretch  - 1 x daily - 7 x weekly - 2 sets - 5 reps - 30 hold - Supine Piriformis Stretch Pulling Heel to Hip  - 1 x daily - 7 x weekly - 2 sets - 5 reps - 15 hold - Supine Hip Adductor Stretch  - 1 x daily - 7 x weekly - 2 sets - 5 reps - 15 hold   Access Code: [email or web address] URL: [email or web address] Date: 03/15/2023 Prepared by: Cassie Freer  Exercises - Supine Bridge  - 1 x daily - 7 x weekly - 2 sets - 10 reps - Beginner Side Leg Lift  - 1 x daily - 7 x weekly - 2 sets - 10 reps - Clamshell with Resistance  - 1 x daily - 7 x weekly - 2 sets - 10 reps - Supine Piriformis Stretch with Foot on Ground  - 1 x daily - 7 x weekly - 2 reps - 15-30 hold  ASSESSMENT:  CLINICAL IMPRESSION:  Patient will have MRI in November 7.  She feels like  the traction gives her some relief for a day or two,adjusted the HEP.  Went over all proper body machanics for ADL's, she is to try the HEP over the next week, we will see one time next week to see how she is doing. OBJECTIVE IMPAIRMENTS: difficulty walking, decreased strength, and pain.   REHAB POTENTIAL: Good  CLINICAL DECISION MAKING: Stable/uncomplicated  EVALUATION COMPLEXITY: Low   GOALS: Goals reviewed with patient? Yes  SHORT TERM GOALS: Target date: 04/12/23  Patient will be independent with initial HEP. Goal status: 04/25/23 MET   LONG TERM GOALS: Target date: 05/17/23  Patient will be independent with advanced/ongoing HEP to improve outcomes  and carryover.  Goal status: progressing 06/12/23  2.  Patient will report at least 75% improvement in R hip pain to improve QOL. Baseline: 4/10 pain /Goal status: Progressing 06/26/23  3.  Patient will demonstrate improved functional R hip strength as demonstrated by 5/5 for hip extension and abduction. Baseline: 4-/5 Goal status: 04/25/23 MET  4.  Patient will be able to ambulate 500' with normal gait pattern and no pain  Baseline: pain with walking Goal status: 04/25/23 MET  5.  Patient will report 21 on FOTO (patient reported outcome measure) to demonstrate improved functional ability. Baseline: 41 Goal status: progressing 06/26/23   PLAN:  PT FREQUENCY: 2x/week  PT DURATION: 8 weeks  PLANNED INTERVENTIONS: Therapeutic exercises, Therapeutic activity, Neuromuscular re-education, Balance training, Gait training, Patient/Family education, Self Care, Joint mobilization, Stair training, Dry Needling, Electrical stimulation, Cryotherapy, Moist heat, Ultrasound, Ionotophoresis 4mg /ml Dexamethasone, and Manual therapy  PLAN FOR NEXT SESSION: will continue with traction and core stability to see if we can help her, she has MRI on November 7th  see how HEP and the body mechanics are going   Jearld Lesch, PT 06/28/2023, 9:48 AM  [facility] at [facility] [address] [facility] [place], [place], [address] Phone: [phone]   Fax:  [phone]

## 2023-07-03 ENCOUNTER — Other Ambulatory Visit: Payer: Self-pay | Admitting: Orthopedic Surgery

## 2023-07-03 ENCOUNTER — Ambulatory Visit: Payer: BC Managed Care – PPO | Admitting: Physical Therapy

## 2023-07-03 DIAGNOSIS — M4807 Spinal stenosis, lumbosacral region: Secondary | ICD-10-CM

## 2023-07-05 ENCOUNTER — Encounter: Payer: Self-pay | Admitting: Physical Therapy

## 2023-07-05 ENCOUNTER — Ambulatory Visit: Payer: BC Managed Care – PPO | Admitting: Physical Therapy

## 2023-07-05 DIAGNOSIS — M6281 Muscle weakness (generalized): Secondary | ICD-10-CM

## 2023-07-05 DIAGNOSIS — R262 Difficulty in walking, not elsewhere classified: Secondary | ICD-10-CM

## 2023-07-05 DIAGNOSIS — M25551 Pain in right hip: Secondary | ICD-10-CM | POA: Diagnosis not present

## 2023-07-05 NOTE — Therapy (Signed)
OUTPATIENT PHYSICAL THERAPY LOWER EXTREMITY    Patient Name: Kimberly Cox MRN: [id number] DOB:Jan 12, 1946, 77 y.o., female Today's Date: 07/05/2023  END OF SESSION:  PT End of Session - 07/05/23 0929     Visit Number 15    Date for PT Re-Evaluation 07/22/23    Authorization Type BCBS    PT Start Time 0929    PT Stop Time 1020    PT Time Calculation (min) 51 min    Activity Tolerance Patient tolerated treatment well    Behavior During Therapy [facility] for tasks assessed/performed               Past Medical History:  Diagnosis Date   Anxiety    Anxiety disorder    Concussion Dec 13th, 2011   fell, striking her head against a wall, developed dizziness - UC eval c/w concussion. Had follow-up and did ok.    History of chest pain    Hyperlipidemia    Hypertension    Plantar fasciitis    Spinal stenosis, lumbar    Past Surgical History:  Procedure Laterality Date   ARTHRODESIS METATARSALPHALANGEAL JOINT (MTPJ) Right 10/08/2015   Procedure: RIGHT HALLUX METATARSAL PHALANGEAL JOINT  ARTHRODESIS;  Surgeon: Toni Arthurs, MD;  Location: [facility];  Service: Orthopedics;  Laterality: Right;   caesarean section     CESAREAN SECTION     HAMMERTOE RECONSTRUCTION WITH WEIL OSTEOTOMY Right 10/08/2015   Procedure: RIGHT SECOND METATARSAL WEIL HAMMERTOE CORRECTION;  Surgeon: Toni Arthurs, MD;  Location: [facility];  Service: Orthopedics;  Laterality: Right;   TONSILLECTOMY     Patient Active Problem List   Diagnosis Date Noted   Diuretic-induced hypokalemia 03/31/2020   Sciatica of right side 05/01/2018   Piriformis syndrome of left side 11/27/2017   GERD with esophagitis 08/22/2017   Asthmatic bronchitis 08/22/2017   Prediabetes 02/02/2017   Pure hyperglyceridemia 02/02/2017   Degenerative cervical disc 01/26/2016   Degenerative lumbar disc 01/26/2016   Spinal stenosis of lumbar region with radiculopathy 01/20/2016   Routine general medical examination  at a health care facility 07/18/2014   Hereditary and idiopathic peripheral neuropathy 01/31/2008   Hyperlipidemia LDL goal <130 12/05/2007   Essential hypertension 12/05/2007    PCP: Sanda Linger  REFERRING PROVIDER: Richardean Canal  REFERRING DIAG: M25.551 (ICD-10-CM) - Pain of right hip   THERAPY DIAG:  Difficulty in walking, not elsewhere classified  Muscle weakness (generalized)  Pain in right hip  Rationale for Evaluation and Treatment: Rehabilitation  ONSET DATE: 03/03/23  SUBJECTIVE:   SUBJECTIVE STATEMENT: Reports that she does think things that we are doing are helping.  She reports that she had to change MRI to 11/14, thus had to cancel the MD visit and now it is not until Jan 31. PERTINENT HISTORY: Spinal stenosis at L3-L4 intra-articular injection gave her good relief for about a week. She is also had 2 trochanteric injections first 1 gave her some relief second 1 gave her no relief. Patient denies any groin pain. Pain mostly lateral aspect of the hip. No radicular symptoms down the leg.   PAIN:  Are you having pain? Yes: NPRS scale: 3/10 Pain location: R hip, starting to travel down the leg  Pain description: dull, ache Aggravating factors: walking, steps Relieving factors: medication   PRECAUTIONS: None  WEIGHT BEARING RESTRICTIONS: No  FALLS:  Has patient fallen in last 6 months? No  LIVING ENVIRONMENT: Lives with: lives with their spouse Lives in: House/apartment Stairs: No Has  following equipment at home: Single point cane  OCCUPATION: Retired  PLOF: Independent  PATIENT GOALS: to get rid of the pain  NEXT MD VISIT: 03/20/23  OBJECTIVE:   DIAGNOSTIC FINDINGS: MRI right hip without contrast dated 03/01/2023 is reviewed with the patient.  Images reviewed. This shows right gluteus minimus tendon with severe tendinosis.  There is also high-grade partial-thickness tear at the insertion.  Mild right gluteus medius tendinosis without tear.  Mild right  hip osteoarthritis.  Trochanteric bursitis on the right is mild.  Mild edema is seen intermuscular portion of the proximal abductor compartment muscular EXTR.  Right greater than left.  Right gluteus medius muscle edema also seen felt to reflect low-grade strains.   Impression: Right hip severe tendinosis right gluteus minimus tendon with high-grade partial-thickness tear at the insertion. Right hip osteoarthritis mild  PATIENT SURVEYS:  FOTO 41  COGNITION: Overall cognitive status: Within functional limits for tasks assessed     SENSATION: WFL  MUSCLE LENGTH: Tightness in BLE  PALPATION: She has tenderness over the right greater trochanteric region.   LOWER EXTREMITY ROM: WFL    LOWER EXTREMITY MMT:  MMT Right eval Left eval  Hip flexion 4+ 5  Hip extension 4-   Hip abduction 4-   Hip adduction    Hip internal rotation    Hip external rotation    Knee flexion 5 5  Knee extension 5 5  Ankle dorsiflexion    Ankle plantarflexion    Ankle inversion    Ankle eversion     (Blank rows = not tested)  FUNCTIONAL TESTS:  5 times sit to stand: 12.43s   TODAY'S TREATMENT:                                                                                                                              DATE:  07/05/23 Reviewed HEP and posture body mechanics that were discussed last visit STM to the glutes and pspinals Passive stretches LE's Lumbar traction 55#   06/28/23 Updated HEP and performed including supine ball exercises Passive stretch Lumbar traction static 55# Review and demo of all proper ADL's with body mechanic instruction for lifting, bed mobility, vacuum, dishes, golfers lift etc..  06/26/23 Bike level 4 x 5 minutes 5# straight arm pulls caused pain being tight in the core  5# AR press again pain  Feet on ball K2C, trunk rotation, isometric abs Passive LE stretches Static lumbar traction 55#  06/15/23 Patient brought in TENS, I educated on use,  placement, batteries, modes, time, precautions, contraindications , placed it on her and tried the different modes, then had her return demo Passive stretch of the LE's PPT Isometric abs Ball b/n knees Static lumbar traction 53#  06/12/23 Passive stretch of the LE's and trunk PPT, isometric abs MHP/IFC to the right low back and right buttock Static lumbar traction 53#  05/22/23 Reviewed MRI of the lumbar and hip area Discussed options of treatment Feet  on ball K2C, rotation, small bridge and isometric abs LE stretches Static lumbar traction 50#   04/27/23 Feet on ball K2C, rotation, small bridge and isometric abs Passive stretch LE's STM with t-gun left HS, right glutes, ITB and HS HEP and performed added today  04/25/23 Pelvic ROM on sit fit 10 x 4 way Red tband 4 way scap stab on sit fit Green tband hip 3 way 12 x each Knee ext 10# 2 sets 12 HS curl 25# 2 sets 12 Walked 1000 plus feet all surfaces, good cadence Mod Dead Lift 6# 2 sets 10 Leg press 3 sets 12 feet 3 way for hips 30# Calf raises 2 sets 15 30#  04/18/23 Bike L 4 TM SW 1 1/2 min each way CGA with cuing 6 inch step up fwd with opp hip ext 10 x each 6 inch lateral step up with opp hip abd 10 x each Resisted gait 40# 5 x 4 ways Leg press 3 sets 12 feet 3 way for hips 30# ( 40 was too heavy) 10# cable pulley 10 x 4 ways BIL- ABD harder Left >RT STS on airex 10x ADD ball squeeze with bridge 10 x hold 3 sec Green tband clams 10x then 10 x with Bridge Knee ext 10# 2 sets 10 HS curl 20# 2 sets 10  PATIENT EDUCATION:  Education details: HEP and POC Person educated: Patient Education method: Explanation Education comprehension: verbalized understanding  HOME EXERCISE PROGRAM: Access Code: [email or web address] URL: [email or web address] Date: 06/28/2023 Prepared by: Stacie Glaze  Exercises - Supine Bridge  - 1 x daily - 7 x weekly - 2 sets - 10 reps - 3 hold - Supine Active Straight Leg Raise   - 1 x daily - 7 x weekly - 2 sets - 10 reps - 3 hold - Standing Row with Anchored Resistance  - 1 x daily - 7 x weekly - 2 sets - 10 reps - 3 hold - Shoulder Extension with Resistance  - 1 x daily - 7 x weekly - 2 sets - 10 reps - 3 hold - Standing Repeated Hip Extension with Resistance  - 1 x daily - 7 x weekly - 2 sets - 10 reps - 3 hold - Standing Repeated Hip Abduction with Resistance  - 1 x daily - 7 x weekly - 2 sets - 10 reps - 3 hold - Supine March  - 1 x daily - 7 x weekly - 2 sets - 10 reps - 3 hold   Access Code: [email or web address] URL: [email or web address] Date: 04/27/2023 Prepared by: Stacie Glaze  Exercises - Seated Hamstring Stretch with Chair  - 1 x daily - 7 x weekly - 2 sets - 5 reps - 30 hold - Seated Table Hamstring Stretch  - 1 x daily - 7 x weekly - 2 sets - 5 reps - 30 hold - Supine Piriformis Stretch Pulling Heel to Hip  - 1 x daily - 7 x weekly - 2 sets - 5 reps - 15 hold - Supine Hip Adductor Stretch  - 1 x daily - 7 x weekly - 2 sets - 5 reps - 15 hold   Access Code: [email or web address] URL: [email or web address] Date: 03/15/2023 Prepared by: Cassie Freer  Exercises - Supine Bridge  - 1 x daily - 7 x weekly - 2 sets - 10 reps - Beginner Side Leg Lift  - 1 x daily - 7 x weekly - 2 sets - 10 reps - Clamshell with Resistance  - 1 x  daily - 7 x weekly - 2 sets - 10 reps - Supine Piriformis Stretch with Foot on Ground  - 1 x daily - 7 x weekly - 2 reps - 15-30 hold  ASSESSMENT:  CLINICAL IMPRESSION:  Patient had to change MRI now November 14.  She then will not be able to see the MD until Jan 31 but is going to try to see prior to that.  She is continuing to report that what we are doing here is helping.  Less overall pain but still hurts and will be very sore OBJECTIVE IMPAIRMENTS: difficulty walking, decreased strength, and pain.   REHAB POTENTIAL: Good  CLINICAL DECISION MAKING: Stable/uncomplicated  EVALUATION COMPLEXITY:  Low   GOALS: Goals reviewed with patient? Yes  SHORT TERM GOALS: Target date: 04/12/23  Patient will be independent with initial HEP. Goal status: 04/25/23 MET   LONG TERM GOALS: Target date: 05/17/23  Patient will be independent with advanced/ongoing HEP to improve outcomes and carryover.  Goal status: progressing 07/05/23  2.  Patient will report at least 75% improvement in R hip pain to improve QOL. Baseline: 4/10 pain /Goal status: Progressing 06/26/23  3.  Patient will demonstrate improved functional R hip strength as demonstrated by 5/5 for hip extension and abduction. Baseline: 4-/5 Goal status: 04/25/23 MET  4.  Patient will be able to ambulate 500' with normal gait pattern and no pain  Baseline: pain with walking Goal status: 04/25/23 MET  5.  Patient will report 96 on FOTO (patient reported outcome measure) to demonstrate improved functional ability. Baseline: 41 Goal status: met 07/05/23   PLAN:  PT FREQUENCY: 2x/week  PT DURATION: 8 weeks  PLANNED INTERVENTIONS: Therapeutic exercises, Therapeutic activity, Neuromuscular re-education, Balance training, Gait training, Patient/Family education, Self Care, Joint mobilization, Stair training, Dry Needling, Electrical stimulation, Cryotherapy, Moist heat, Ultrasound, Ionotophoresis 4mg /ml Dexamethasone, and Manual therapy  PLAN FOR NEXT SESSION: will continue with traction and core stability to see if we can help her, she has MRI on November 14th    Mallori Araque W, PT 07/05/2023, 9:30 AM  [facility] Rehabilitation at [facility]. [place], [place], [address] Phone: [phone]   Fax:  [phone]

## 2023-07-07 ENCOUNTER — Encounter: Payer: Self-pay | Admitting: Sports Medicine

## 2023-07-11 ENCOUNTER — Ambulatory Visit: Payer: BC Managed Care – PPO | Attending: Internal Medicine | Admitting: Physical Therapy

## 2023-07-11 ENCOUNTER — Encounter: Payer: Self-pay | Admitting: Physical Therapy

## 2023-07-11 DIAGNOSIS — R262 Difficulty in walking, not elsewhere classified: Secondary | ICD-10-CM | POA: Insufficient documentation

## 2023-07-11 DIAGNOSIS — M6281 Muscle weakness (generalized): Secondary | ICD-10-CM | POA: Diagnosis not present

## 2023-07-11 DIAGNOSIS — M25551 Pain in right hip: Secondary | ICD-10-CM | POA: Insufficient documentation

## 2023-07-11 DIAGNOSIS — R278 Other lack of coordination: Secondary | ICD-10-CM | POA: Diagnosis not present

## 2023-07-11 NOTE — Therapy (Signed)
OUTPATIENT PHYSICAL THERAPY LOWER EXTREMITY    Patient Name: Kimberly Cox MRN: [id number] DOB:1946-01-01, 77 y.o., female Today's Date: 07/11/2023  END OF SESSION:  PT End of Session - 07/11/23 0923     Visit Number 16    Date for PT Re-Evaluation 07/22/23    Authorization Type BCBS    PT Start Time 0924    PT Stop Time 1012    PT Time Calculation (min) 48 min    Activity Tolerance Patient tolerated treatment well    Behavior During Therapy [facility] for tasks assessed/performed               Past Medical History:  Diagnosis Date   Anxiety    Anxiety disorder    Concussion Dec 13th, 2011   fell, striking her head against a wall, developed dizziness - UC eval c/w concussion. Had follow-up and did ok.    History of chest pain    Hyperlipidemia    Hypertension    Plantar fasciitis    Spinal stenosis, lumbar    Past Surgical History:  Procedure Laterality Date   ARTHRODESIS METATARSALPHALANGEAL JOINT (MTPJ) Right 10/08/2015   Procedure: RIGHT HALLUX METATARSAL PHALANGEAL JOINT  ARTHRODESIS;  Surgeon: Toni Arthurs, MD;  Location: [facility];  Service: Orthopedics;  Laterality: Right;   caesarean section     CESAREAN SECTION     HAMMERTOE RECONSTRUCTION WITH WEIL OSTEOTOMY Right 10/08/2015   Procedure: RIGHT SECOND METATARSAL WEIL HAMMERTOE CORRECTION;  Surgeon: Toni Arthurs, MD;  Location: [facility];  Service: Orthopedics;  Laterality: Right;   TONSILLECTOMY     Patient Active Problem List   Diagnosis Date Noted   Diuretic-induced hypokalemia 03/31/2020   Sciatica of right side 05/01/2018   Piriformis syndrome of left side 11/27/2017   GERD with esophagitis 08/22/2017   Asthmatic bronchitis 08/22/2017   Prediabetes 02/02/2017   Pure hyperglyceridemia 02/02/2017   Degenerative cervical disc 01/26/2016   Degenerative lumbar disc 01/26/2016   Spinal stenosis of lumbar region with radiculopathy 01/20/2016   Routine general medical examination  at a health care facility 07/18/2014   Hereditary and idiopathic peripheral neuropathy 01/31/2008   Hyperlipidemia LDL goal <130 12/05/2007   Essential hypertension 12/05/2007    PCP: Sanda Linger  REFERRING PROVIDER: Richardean Canal  REFERRING DIAG: M25.551 (ICD-10-CM) - Pain of right hip   THERAPY DIAG:  Difficulty in walking, not elsewhere classified  Muscle weakness (generalized)  Pain in right hip  Other lack of coordination  Rationale for Evaluation and Treatment: Rehabilitation  ONSET DATE: 03/03/23  SUBJECTIVE:   SUBJECTIVE STATEMENT: Patient was able to get the x-ray, MRI and the MD visit all within about 10 days of each other in Mid November PERTINENT HISTORY: Spinal stenosis at L3-L4 intra-articular injection gave her good relief for about a week. She is also had 2 trochanteric injections first 1 gave her some relief second 1 gave her no relief. Patient denies any groin pain. Pain mostly lateral aspect of the hip. No radicular symptoms down the leg.   PAIN:  Are you having pain? Yes: NPRS scale: 4/10 Pain location: R hip, starting to travel down the leg  Pain description: dull, ache Aggravating factors: walking, steps Relieving factors: medication   PRECAUTIONS: None  WEIGHT BEARING RESTRICTIONS: No  FALLS:  Has patient fallen in last 6 months? No  LIVING ENVIRONMENT: Lives with: lives with their spouse Lives in: House/apartment Stairs: No Has following equipment at home: Single point cane  OCCUPATION: Retired  PLOF: Independent  PATIENT GOALS: to get rid of the pain  NEXT MD VISIT: 03/20/23  OBJECTIVE:   DIAGNOSTIC FINDINGS: MRI right hip without contrast dated 03/01/2023 is reviewed with the patient.  Images reviewed. This shows right gluteus minimus tendon with severe tendinosis.  There is also high-grade partial-thickness tear at the insertion.  Mild right gluteus medius tendinosis without tear.  Mild right hip osteoarthritis.  Trochanteric  bursitis on the right is mild.  Mild edema is seen intermuscular portion of the proximal abductor compartment muscular EXTR.  Right greater than left.  Right gluteus medius muscle edema also seen felt to reflect low-grade strains.   Impression: Right hip severe tendinosis right gluteus minimus tendon with high-grade partial-thickness tear at the insertion. Right hip osteoarthritis mild  PATIENT SURVEYS:  FOTO 41  COGNITION: Overall cognitive status: Within functional limits for tasks assessed     SENSATION: WFL  MUSCLE LENGTH: Tightness in BLE  PALPATION: She has tenderness over the right greater trochanteric region.   LOWER EXTREMITY ROM: WFL    LOWER EXTREMITY MMT:  MMT Right eval Left eval  Hip flexion 4+ 5  Hip extension 4-   Hip abduction 4-   Hip adduction    Hip internal rotation    Hip external rotation    Knee flexion 5 5  Knee extension 5 5  Ankle dorsiflexion    Ankle plantarflexion    Ankle inversion    Ankle eversion     (Blank rows = not tested)  FUNCTIONAL TESTS:  5 times sit to stand: 12.43s   TODAY'S TREATMENT:                                                                                                                              DATE:  07/10/23 Walk outside around the parking [place] and to the front door Passive stretch to the LE's Feet on ball K2C, rotation, bridge and isometric abs Green tband  clamshells Ball b/n knees squeeze Static lumbar traction 55#  07/05/23 Reviewed HEP and posture body mechanics that were discussed last visit STM to the glutes and pspinals Passive stretches LE's Lumbar traction 55#   06/28/23 Updated HEP and performed including supine ball exercises Passive stretch Lumbar traction static 55# Review and demo of all proper ADL's with body mechanic instruction for lifting, bed mobility, vacuum, dishes, golfers lift etc..  06/26/23 Bike level 4 x 5 minutes 5# straight arm pulls caused pain being tight in  the core  5# AR press again pain  Feet on ball K2C, trunk rotation, isometric abs Passive LE stretches Static lumbar traction 55#  06/15/23 Patient brought in TENS, I educated on use, placement, batteries, modes, time, precautions, contraindications , placed it on her and tried the different modes, then had her return demo Passive stretch of the LE's PPT Isometric abs Ball b/n knees Static lumbar traction 53#  06/12/23 Passive stretch of the LE's and trunk PPT, isometric abs  MHP/IFC to the right low back and right buttock Static lumbar traction 53#  05/22/23 Reviewed MRI of the lumbar and hip area Discussed options of treatment Feet on ball K2C, rotation, small bridge and isometric abs LE stretches Static lumbar traction 50#   04/27/23 Feet on ball K2C, rotation, small bridge and isometric abs Passive stretch LE's STM with t-gun left HS, right glutes, ITB and HS HEP and performed added today  04/25/23 Pelvic ROM on sit fit 10 x 4 way Red tband 4 way scap stab on sit fit Green tband hip 3 way 12 x each Knee ext 10# 2 sets 12 HS curl 25# 2 sets 12 Walked 1000 plus feet all surfaces, good cadence Mod Dead Lift 6# 2 sets 10 Leg press 3 sets 12 feet 3 way for hips 30# Calf raises 2 sets 15 30#  04/18/23 Bike L 4 TM SW 1 1/2 min each way CGA with cuing 6 inch step up fwd with opp hip ext 10 x each 6 inch lateral step up with opp hip abd 10 x each Resisted gait 40# 5 x 4 ways Leg press 3 sets 12 feet 3 way for hips 30# ( 40 was too heavy) 10# cable pulley 10 x 4 ways BIL- ABD harder Left >RT STS on airex 10x ADD ball squeeze with bridge 10 x hold 3 sec Green tband clams 10x then 10 x with Bridge Knee ext 10# 2 sets 10 HS curl 20# 2 sets 10  PATIENT EDUCATION:  Education details: HEP and POC Person educated: Patient Education method: Explanation Education comprehension: verbalized understanding  HOME EXERCISE PROGRAM: Access Code: [email or web address] URL:  [email or web address] Date: 06/28/2023 Prepared by: Stacie Glaze  Exercises - Supine Bridge  - 1 x daily - 7 x weekly - 2 sets - 10 reps - 3 hold - Supine Active Straight Leg Raise  - 1 x daily - 7 x weekly - 2 sets - 10 reps - 3 hold - Standing Row with Anchored Resistance  - 1 x daily - 7 x weekly - 2 sets - 10 reps - 3 hold - Shoulder Extension with Resistance  - 1 x daily - 7 x weekly - 2 sets - 10 reps - 3 hold - Standing Repeated Hip Extension with Resistance  - 1 x daily - 7 x weekly - 2 sets - 10 reps - 3 hold - Standing Repeated Hip Abduction with Resistance  - 1 x daily - 7 x weekly - 2 sets - 10 reps - 3 hold - Supine March  - 1 x daily - 7 x weekly - 2 sets - 10 reps - 3 hold   Access Code: [email or web address] URL: [email or web address] Date: 04/27/2023 Prepared by: Stacie Glaze  Exercises - Seated Hamstring Stretch with Chair  - 1 x daily - 7 x weekly - 2 sets - 5 reps - 30 hold - Seated Table Hamstring Stretch  - 1 x daily - 7 x weekly - 2 sets - 5 reps - 30 hold - Supine Piriformis Stretch Pulling Heel to Hip  - 1 x daily - 7 x weekly - 2 sets - 5 reps - 15 hold - Supine Hip Adductor Stretch  - 1 x daily - 7 x weekly - 2 sets - 5 reps - 15 hold   Access Code: [email or web address] URL: [email or web address] Date: 03/15/2023 Prepared by: Cassie Freer  Exercises - Supine Bridge  - 1 x daily - 7 x weekly - 2 sets - 10 reps -  Beginner Side Leg Lift  - 1 x daily - 7 x weekly - 2 sets - 10 reps - Clamshell with Resistance  - 1 x daily - 7 x weekly - 2 sets - 10 reps - Supine Piriformis Stretch with Foot on Ground  - 1 x daily - 7 x weekly - 2 reps - 15-30 hold  ASSESSMENT:  CLINICAL IMPRESSION:  Patient had to change MRI now November 14. She was able to get an appointment with the MD on the 20th, she is reporting less back pain but really feeling more pain in the hips and the groin, added some adductor and hip flexor/quad.  Sje was SOB after  the walk OBJECTIVE IMPAIRMENTS: difficulty walking, decreased strength, and pain.   REHAB POTENTIAL: Good  CLINICAL DECISION MAKING: Stable/uncomplicated  EVALUATION COMPLEXITY: Low   GOALS: Goals reviewed with patient? Yes  SHORT TERM GOALS: Target date: 04/12/23  Patient will be independent with initial HEP. Goal status: 04/25/23 MET   LONG TERM GOALS: Target date: 05/17/23  Patient will be independent with advanced/ongoing HEP to improve outcomes and carryover.  Goal status: progressing 07/05/23  2.  Patient will report at least 75% improvement in R hip pain to improve QOL. Baseline: 4/10 pain /Goal status: Progressing 07/11/23  3.  Patient will demonstrate improved functional R hip strength as demonstrated by 5/5 for hip extension and abduction. Baseline: 4-/5 Goal status: 04/25/23 MET  4.  Patient will be able to ambulate 500' with normal gait pattern and no pain  Baseline: pain with walking Goal status: 04/25/23 MET  5.  Patient will report 68 on FOTO (patient reported outcome measure) to demonstrate improved functional ability. Baseline: 41 Goal status: met 07/05/23   PLAN:  PT FREQUENCY: 2x/week  PT DURATION: 8 weeks  PLANNED INTERVENTIONS: Therapeutic exercises, Therapeutic activity, Neuromuscular re-education, Balance training, Gait training, Patient/Family education, Self Care, Joint mobilization, Stair training, Dry Needling, Electrical stimulation, Cryotherapy, Moist heat, Ultrasound, Ionotophoresis 4mg /ml Dexamethasone, and Manual therapy  PLAN FOR NEXT SESSION: will continue with traction and core stability to see if we can help her, she has MRI on November 14th    Lucienne Sawyers W, PT 07/11/2023, 9:42 AM  [facility] at [facility] [place], [place], [address] Phone: [phone]   Fax:  [phone]

## 2023-07-12 ENCOUNTER — Ambulatory Visit: Payer: BC Managed Care – PPO

## 2023-07-13 DIAGNOSIS — M4316 Spondylolisthesis, lumbar region: Secondary | ICD-10-CM | POA: Diagnosis not present

## 2023-07-13 DIAGNOSIS — M4312 Spondylolisthesis, cervical region: Secondary | ICD-10-CM | POA: Diagnosis not present

## 2023-07-13 DIAGNOSIS — M5134 Other intervertebral disc degeneration, thoracic region: Secondary | ICD-10-CM | POA: Diagnosis not present

## 2023-07-13 DIAGNOSIS — Z01818 Encounter for other preprocedural examination: Secondary | ICD-10-CM | POA: Diagnosis not present

## 2023-07-13 DIAGNOSIS — M21161 Varus deformity, not elsewhere classified, right knee: Secondary | ICD-10-CM | POA: Diagnosis not present

## 2023-07-13 DIAGNOSIS — M21162 Varus deformity, not elsewhere classified, left knee: Secondary | ICD-10-CM | POA: Diagnosis not present

## 2023-07-17 ENCOUNTER — Ambulatory Visit: Payer: BC Managed Care – PPO | Admitting: Physical Therapy

## 2023-07-17 ENCOUNTER — Encounter: Payer: Self-pay | Admitting: Physical Therapy

## 2023-07-17 DIAGNOSIS — M25551 Pain in right hip: Secondary | ICD-10-CM | POA: Diagnosis not present

## 2023-07-17 DIAGNOSIS — M6281 Muscle weakness (generalized): Secondary | ICD-10-CM | POA: Diagnosis not present

## 2023-07-17 DIAGNOSIS — R262 Difficulty in walking, not elsewhere classified: Secondary | ICD-10-CM

## 2023-07-17 DIAGNOSIS — R278 Other lack of coordination: Secondary | ICD-10-CM | POA: Diagnosis not present

## 2023-07-17 NOTE — Therapy (Signed)
OUTPATIENT PHYSICAL THERAPY LOWER EXTREMITY    Patient Name: Kimberly Cox MRN: [id number] DOB:[birth year], 77 y.o., female Today's Date: 07/17/2023  END OF SESSION:  PT End of Session - 07/17/23 0843     Visit Number 17    Date for PT Re-Evaluation 07/22/23    Authorization Type BCBS    PT Start Time 0839    PT Stop Time 0925    PT Time Calculation (min) 46 min    Activity Tolerance Patient tolerated treatment well    Behavior During Therapy [facility] for tasks assessed/performed               Past Medical History:  Diagnosis Date   Anxiety    Anxiety disorder    Concussion Dec 13th, 2011   fell, striking her head against a wall, developed dizziness - UC eval c/w concussion. Had follow-up and did ok.    History of chest pain    Hyperlipidemia    Hypertension    Plantar fasciitis    Spinal stenosis, lumbar    Past Surgical History:  Procedure Laterality Date   ARTHRODESIS METATARSALPHALANGEAL JOINT (MTPJ) Right 10/08/2015   Procedure: RIGHT HALLUX METATARSAL PHALANGEAL JOINT  ARTHRODESIS;  Surgeon: Toni Arthurs, MD;  Location: [facility];  Service: Orthopedics;  Laterality: Right;   caesarean section     CESAREAN SECTION     HAMMERTOE RECONSTRUCTION WITH WEIL OSTEOTOMY Right 10/08/2015   Procedure: RIGHT SECOND METATARSAL WEIL HAMMERTOE CORRECTION;  Surgeon: Toni Arthurs, MD;  Location: [facility];  Service: Orthopedics;  Laterality: Right;   TONSILLECTOMY     Patient Active Problem List   Diagnosis Date Noted   Diuretic-induced hypokalemia 03/31/2020   Sciatica of right side 05/01/2018   Piriformis syndrome of left side 11/27/2017   GERD with esophagitis 08/22/2017   Asthmatic bronchitis 08/22/2017   Prediabetes 02/02/2017   Pure hyperglyceridemia 02/02/2017   Degenerative cervical disc 01/26/2016   Degenerative lumbar disc 01/26/2016   Spinal stenosis of lumbar region with radiculopathy 01/20/2016   Routine general medical examination  at a health care facility 07/18/2014   Hereditary and idiopathic peripheral neuropathy 01/31/2008   Hyperlipidemia LDL goal <130 12/05/2007   Essential hypertension 12/05/2007    PCP: Sanda Linger  REFERRING PROVIDER: Richardean Canal  REFERRING DIAG: M25.551 (ICD-10-CM) - Pain of right hip   THERAPY DIAG:  Difficulty in walking, not elsewhere classified  Muscle weakness (generalized)  Pain in right hip  Rationale for Evaluation and Treatment: Rehabilitation  ONSET DATE: 03/03/23  SUBJECTIVE:   SUBJECTIVE STATEMENT: Had an EOS x-ray last week and will have an MRI on Friday. PERTINENT HISTORY: Spinal stenosis at L3-L4 intra-articular injection gave her good relief for about a week. She is also had 2 trochanteric injections first 1 gave her some relief second 1 gave her no relief. Patient denies any groin pain. Pain mostly lateral aspect of the hip. No radicular symptoms down the leg.   PAIN:  Are you having pain? Yes: NPRS scale: 4/10 Pain location: R hip, starting to travel down the leg  Pain description: dull, ache Aggravating factors: walking, steps Relieving factors: medication   PRECAUTIONS: None  WEIGHT BEARING RESTRICTIONS: No  FALLS:  Has patient fallen in last 6 months? No  LIVING ENVIRONMENT: Lives with: lives with their spouse Lives in: House/apartment Stairs: No Has following equipment at home: Single point cane  OCCUPATION: Retired  PLOF: Independent  PATIENT GOALS: to get rid of the pain  NEXT MD  VISIT: 03/20/23  OBJECTIVE:   DIAGNOSTIC FINDINGS: MRI right hip without contrast dated 03/01/2023 is reviewed with the patient.  Images reviewed. This shows right gluteus minimus tendon with severe tendinosis.  There is also high-grade partial-thickness tear at the insertion.  Mild right gluteus medius tendinosis without tear.  Mild right hip osteoarthritis.  Trochanteric bursitis on the right is mild.  Mild edema is seen intermuscular portion of the  proximal abductor compartment muscular EXTR.  Right greater than left.  Right gluteus medius muscle edema also seen felt to reflect low-grade strains.   Impression: Right hip severe tendinosis right gluteus minimus tendon with high-grade partial-thickness tear at the insertion. Right hip osteoarthritis mild  PATIENT SURVEYS:  FOTO 41  COGNITION: Overall cognitive status: Within functional limits for tasks assessed     SENSATION: WFL  MUSCLE LENGTH: Tightness in BLE  PALPATION: She has tenderness over the right greater trochanteric region.   LOWER EXTREMITY ROM: WFL    LOWER EXTREMITY MMT:  MMT Right eval Left eval  Hip flexion 4+ 5  Hip extension 4-   Hip abduction 4-   Hip adduction    Hip internal rotation    Hip external rotation    Knee flexion 5 5  Knee extension 5 5  Ankle dorsiflexion    Ankle plantarflexion    Ankle inversion    Ankle eversion     (Blank rows = not tested)  FUNCTIONAL TESTS:  5 times sit to stand: 12.43s   TODAY'S TREATMENT:                                                                                                                              DATE:  07/17/23 Nustep level 5 x 5 minutes 5# straight arm pull 10# seated row 15# lat pulls Red tband ER Passive stretch Hs and piriformis Feet on ball K2C, trunk rotation, bridge, isometric abs Supine green tband hip extension 62# static lumbar traction  07/10/23 Walk outside around the parking [place] and to the front door Passive stretch to the LE's Feet on ball K2C, rotation, bridge and isometric abs Green tband  clamshells Ball b/n knees squeeze Static lumbar traction 55#  07/05/23 Reviewed HEP and posture body mechanics that were discussed last visit STM to the glutes and pspinals Passive stretches LE's Lumbar traction 55#   06/28/23 Updated HEP and performed including supine ball exercises Passive stretch Lumbar traction static 55# Review and demo of all proper ADL's  with body mechanic instruction for lifting, bed mobility, vacuum, dishes, golfers lift etc..  06/26/23 Bike level 4 x 5 minutes 5# straight arm pulls caused pain being tight in the core  5# AR press again pain  Feet on ball K2C, trunk rotation, isometric abs Passive LE stretches Static lumbar traction 55#  06/15/23 Patient brought in TENS, I educated on use, placement, batteries, modes, time, precautions, contraindications , placed it on her and tried the different modes, then had  her return demo Passive stretch of the LE's PPT Isometric abs Ball b/n knees Static lumbar traction 53#  06/12/23 Passive stretch of the LE's and trunk PPT, isometric abs MHP/IFC to the right low back and right buttock Static lumbar traction 53#  05/22/23 Reviewed MRI of the lumbar and hip area Discussed options of treatment Feet on ball K2C, rotation, small bridge and isometric abs LE stretches Static lumbar traction 50#   04/27/23 Feet on ball K2C, rotation, small bridge and isometric abs Passive stretch LE's STM with t-gun left HS, right glutes, ITB and HS HEP and performed added today  04/25/23 Pelvic ROM on sit fit 10 x 4 way Red tband 4 way scap stab on sit fit Green tband hip 3 way 12 x each Knee ext 10# 2 sets 12 HS curl 25# 2 sets 12 Walked 1000 plus feet all surfaces, good cadence Mod Dead Lift 6# 2 sets 10 Leg press 3 sets 12 feet 3 way for hips 30# Calf raises 2 sets 15 30#  04/18/23 Bike L 4 TM SW 1 1/2 min each way CGA with cuing 6 inch step up fwd with opp hip ext 10 x each 6 inch lateral step up with opp hip abd 10 x each Resisted gait 40# 5 x 4 ways Leg press 3 sets 12 feet 3 way for hips 30# ( 40 was too heavy) 10# cable pulley 10 x 4 ways BIL- ABD harder Left >RT STS on airex 10x ADD ball squeeze with bridge 10 x hold 3 sec Green tband clams 10x then 10 x with Bridge Knee ext 10# 2 sets 10 HS curl 20# 2 sets 10  PATIENT EDUCATION:  Education details: HEP and  POC Person educated: Patient Education method: Explanation Education comprehension: verbalized understanding  HOME EXERCISE PROGRAM: Access Code: [email or web address] URL: [email or web address] Date: 06/28/2023 Prepared by: Stacie Glaze  Exercises - Supine Bridge  - 1 x daily - 7 x weekly - 2 sets - 10 reps - 3 hold - Supine Active Straight Leg Raise  - 1 x daily - 7 x weekly - 2 sets - 10 reps - 3 hold - Standing Row with Anchored Resistance  - 1 x daily - 7 x weekly - 2 sets - 10 reps - 3 hold - Shoulder Extension with Resistance  - 1 x daily - 7 x weekly - 2 sets - 10 reps - 3 hold - Standing Repeated Hip Extension with Resistance  - 1 x daily - 7 x weekly - 2 sets - 10 reps - 3 hold - Standing Repeated Hip Abduction with Resistance  - 1 x daily - 7 x weekly - 2 sets - 10 reps - 3 hold - Supine March  - 1 x daily - 7 x weekly - 2 sets - 10 reps - 3 hold   Access Code: [email or web address] URL: [email or web address] Date: 04/27/2023 Prepared by: Stacie Glaze  Exercises - Seated Hamstring Stretch with Chair  - 1 x daily - 7 x weekly - 2 sets - 5 reps - 30 hold - Seated Table Hamstring Stretch  - 1 x daily - 7 x weekly - 2 sets - 5 reps - 30 hold - Supine Piriformis Stretch Pulling Heel to Hip  - 1 x daily - 7 x weekly - 2 sets - 5 reps - 15 hold - Supine Hip Adductor Stretch  - 1 x daily - 7 x weekly - 2 sets - 5 reps - 15 hold   Access  Code: [email or web address] URL: [email or web address] Date: 03/15/2023 Prepared by: Cassie Freer  Exercises - Supine Bridge  - 1 x daily - 7 x weekly - 2 sets - 10 reps - Beginner Side Leg Lift  - 1 x daily - 7 x weekly - 2 sets - 10 reps - Clamshell with Resistance  - 1 x daily - 7 x weekly - 2 sets - 10 reps - Supine Piriformis Stretch with Foot on Ground  - 1 x daily - 7 x weekly - 2 reps - 15-30 hold  ASSESSMENT:  CLINICAL IMPRESSION:  Patient had a full body low radiation x-ray last week, she will have an MRI this  week, she reports that she is feeling better.  I did bump up the weight on the traction. OBJECTIVE IMPAIRMENTS: difficulty walking, decreased strength, and pain.   REHAB POTENTIAL: Good  CLINICAL DECISION MAKING: Stable/uncomplicated  EVALUATION COMPLEXITY: Low   GOALS: Goals reviewed with patient? Yes  SHORT TERM GOALS: Target date: 04/12/23  Patient will be independent with initial HEP. Goal status: 04/25/23 MET   LONG TERM GOALS: Target date: 05/17/23  Patient will be independent with advanced/ongoing HEP to improve outcomes and carryover.  Goal status: progressing 07/05/23  2.  Patient will report at least 75% improvement in R hip pain to improve QOL. Baseline: 4/10 pain /Goal status: Progressing 07/11/23  3.  Patient will demonstrate improved functional R hip strength as demonstrated by 5/5 for hip extension and abduction. Baseline: 4-/5 Goal status: 04/25/23 MET  4.  Patient will be able to ambulate 500' with normal gait pattern and no pain  Baseline: pain with walking Goal status: 04/25/23 MET  5.  Patient will report 8 on FOTO (patient reported outcome measure) to demonstrate improved functional ability. Baseline: 41 Goal status: met 07/05/23   PLAN:  PT FREQUENCY: 2x/week  PT DURATION: 8 weeks  PLANNED INTERVENTIONS: Therapeutic exercises, Therapeutic activity, Neuromuscular re-education, Balance training, Gait training, Patient/Family education, Self Care, Joint mobilization, Stair training, Dry Needling, Electrical stimulation, Cryotherapy, Moist heat, Ultrasound, Ionotophoresis 4mg /ml Dexamethasone, and Manual therapy  PLAN FOR NEXT SESSION: see what the MRI and the MD says   Jearld Lesch, PT 07/17/2023, 8:44 AM  [facility] at [facility] [facility]. [place], [place], [address] Phone: [phone]   Fax:  [phone]

## 2023-07-18 ENCOUNTER — Ambulatory Visit (INDEPENDENT_AMBULATORY_CARE_PROVIDER_SITE_OTHER): Payer: BC Managed Care – PPO | Admitting: Sports Medicine

## 2023-07-18 ENCOUNTER — Encounter: Payer: Self-pay | Admitting: Sports Medicine

## 2023-07-18 DIAGNOSIS — G8929 Other chronic pain: Secondary | ICD-10-CM | POA: Diagnosis not present

## 2023-07-18 DIAGNOSIS — M12811 Other specific arthropathies, not elsewhere classified, right shoulder: Secondary | ICD-10-CM | POA: Diagnosis not present

## 2023-07-18 DIAGNOSIS — M25511 Pain in right shoulder: Secondary | ICD-10-CM | POA: Diagnosis not present

## 2023-07-18 NOTE — Progress Notes (Signed)
Kimberly Cox - 77 y.o. female MRN [id number]  Date of birth: 1946/09/02  Office Visit Note: Visit Date: 07/18/2023 PCP: Etta Grandchild, MD Referred by: Etta Grandchild, MD  Subjective: Chief Complaint  Patient presents with   Right Shoulder - Pain   HPI: Kimberly Cox is a pleasant 77 y.o. female who presents today for acute on chronic right shoulder pain.  Feels like the right shoulder has somewhat worsened since our last visit.  She is doing some of the home exercises and they are touching the shoulder somewhat in therapy although this is largely for her back/hip.  Pain is localized over the lateral arm going slightly down into the deltoid.  No radicular symptoms.  She is interested in trying shockwave since she had such good relief from her hip.  Pertinent ROS were reviewed with the patient and found to be negative unless otherwise specified above in HPI.   Assessment & Plan: Visit Diagnoses:  1. Chronic right shoulder pain   2. Rotator cuff arthropathy of right shoulder    Plan: Impression he was right shoulder pain with likely rotator cuff arthropathy, her exam points to the supraspinatus being the most bothersome.  She has no gross weakness with testing however.  She received excellent relief of her tendinopathy from the lateral hip and is interested in trying this for the shoulder, we did proceed with a treatment today.  She will continue her home exercises as well as sprinkle in some rehab for the right shoulder at PT (will let me know if needs formal referral).  She may use Advil or her Celebrex 100 mg for pain control as needed.  Recommend heat as well.  We will plan for a few sessions of ECSWT and monitor the degree of her improvement before deciding on additional vs. Alternative treatment.  Other considerations if not finding improvement with the ultrasound of the rotator cuff tendons for evaluation, possible subacromial joint injection therapy.  Follow-up: Return in  about 1 week (around 07/25/2023) for for R-shoulder .   Meds & Orders: No orders of the defined types were placed in this encounter.  No orders of the defined types were placed in this encounter.    Procedures: Procedure: ECSWT Indications:  Right rotator cuff tendinopathy   Procedure Details Consent: Risks of procedure as well as the alternatives and risks of each were explained to the patient.  Verbal consent for procedure obtained. Time Out: Verified patient identification, verified procedure, site was marked, verified correct patient position. The area was cleaned with alcohol swab.     The right shoulder and rotator cuff interval was targeted for Extracorporeal shockwave therapy.    Preset: Rotator cuff tendinopathy/Shoulder problems Power Level: 100 mJ Frequency: 12 Hz Impulse/cycles: 3000 Head size: Regular   Patient tolerated procedure well without immediate complications.       Clinical History: No specialty comments available.  She reports that she has quit smoking. She has never used smokeless tobacco. No results for input(s): "HGBA1C", "LABURIC" in the last 8760 hours.  Objective:    Physical Exam  Gen: Well-appearing, in no acute distress; non-toxic CV: Well-perfused. Warm.  Resp: Breathing unlabored on room air; no wheezing. Psych: Fluid speech in conversation; appropriate affect; normal thought process Neuro: Sensation intact throughout. No gross coordination deficits.   Ortho Exam - Right shoulder: + TTP at Codman's point.  There is near full active and passive range of motion although some pain at endrange flexion and abduction.  Positive drop arm, positive empty can sign.  There is pain with resisted internal/external rotation at endrange but no restriction.  Imaging:  XR Shoulder Right 3 views of the right shoulder including AP, Grashey and axial view were  ordered and reviewed by myself.  There is at least moderate AC joint  arthritic change, mild  glenohumeral joint arthritis.  Humeral head is well  located within the groove.  There is no acute fracture or otherwise bony  abnormality noted.  Past Medical/Family/Surgical/Social History: Medications & Allergies reviewed per EMR, new medications updated. Patient Active Problem List   Diagnosis Date Noted   Diuretic-induced hypokalemia 03/31/2020   Sciatica of right side 05/01/2018   Piriformis syndrome of left side 11/27/2017   GERD with esophagitis 08/22/2017   Asthmatic bronchitis 08/22/2017   Prediabetes 02/02/2017   Pure hyperglyceridemia 02/02/2017   Degenerative cervical disc 01/26/2016   Degenerative lumbar disc 01/26/2016   Spinal stenosis of lumbar region with radiculopathy 01/20/2016   Routine general medical examination at a health care facility 07/18/2014   Hereditary and idiopathic peripheral neuropathy 01/31/2008   Hyperlipidemia LDL goal <130 12/05/2007   Essential hypertension 12/05/2007   Past Medical History:  Diagnosis Date   Anxiety    Anxiety disorder    Concussion Dec 13th, 2011   fell, striking her head against a wall, developed dizziness - UC eval c/w concussion. Had follow-up and did ok.    History of chest pain    Hyperlipidemia    Hypertension    Plantar fasciitis    Spinal stenosis, lumbar    Family History  Problem Relation Age of Onset   Cancer Mother    Coronary artery disease Father    Past Surgical History:  Procedure Laterality Date   ARTHRODESIS METATARSALPHALANGEAL JOINT (MTPJ) Right 10/08/2015   Procedure: RIGHT HALLUX METATARSAL PHALANGEAL JOINT  ARTHRODESIS;  Surgeon: Toni Arthurs, MD;  Location: [facility];  Service: Orthopedics;  Laterality: Right;   caesarean section     CESAREAN SECTION     HAMMERTOE RECONSTRUCTION WITH WEIL OSTEOTOMY Right 10/08/2015   Procedure: RIGHT SECOND METATARSAL WEIL HAMMERTOE CORRECTION;  Surgeon: Toni Arthurs, MD;  Location: [facility];  Service: Orthopedics;  Laterality:  Right;   TONSILLECTOMY     Social History   Occupational History   Not on file  Tobacco Use   Smoking status: Former   Smokeless tobacco: Never   Tobacco comments:    only in h.s.  Vaping Use   Vaping status: Never Used  Substance and Sexual Activity   Alcohol use: Yes    Comment: occ   Drug use: No   Sexual activity: Not Currently    Birth control/protection: Post-menopausal    Comment: 1st intercourse- 20, partners- 1

## 2023-07-18 NOTE — Progress Notes (Signed)
Patient says that she has had worsening pain with her right shoulder through ranges of motion. She says that even reaching overhead to wash her hair is painful. She is hopeful that shockwave may help as it did for her hip.

## 2023-07-21 ENCOUNTER — Ambulatory Visit
Admission: RE | Admit: 2023-07-21 | Discharge: 2023-07-21 | Disposition: A | Discharge status: Home / Self Care | Payer: BC Managed Care – PPO | Source: Ambulatory Visit | Attending: Orthopedic Surgery | Admitting: Orthopedic Surgery

## 2023-07-21 DIAGNOSIS — M47816 Spondylosis without myelopathy or radiculopathy, lumbar region: Secondary | ICD-10-CM | POA: Diagnosis not present

## 2023-07-21 DIAGNOSIS — M5126 Other intervertebral disc displacement, lumbar region: Secondary | ICD-10-CM | POA: Diagnosis not present

## 2023-07-21 DIAGNOSIS — M4807 Spinal stenosis, lumbosacral region: Secondary | ICD-10-CM

## 2023-07-21 DIAGNOSIS — M48061 Spinal stenosis, lumbar region without neurogenic claudication: Secondary | ICD-10-CM | POA: Diagnosis not present

## 2023-07-25 ENCOUNTER — Ambulatory Visit: Payer: BC Managed Care – PPO | Admitting: Sports Medicine

## 2023-07-25 ENCOUNTER — Ambulatory Visit (INDEPENDENT_AMBULATORY_CARE_PROVIDER_SITE_OTHER): Payer: BC Managed Care – PPO | Admitting: Sports Medicine

## 2023-07-25 ENCOUNTER — Encounter: Payer: Self-pay | Admitting: Sports Medicine

## 2023-07-25 DIAGNOSIS — G8929 Other chronic pain: Secondary | ICD-10-CM | POA: Diagnosis not present

## 2023-07-25 DIAGNOSIS — M25511 Pain in right shoulder: Secondary | ICD-10-CM | POA: Diagnosis not present

## 2023-07-25 DIAGNOSIS — M12811 Other specific arthropathies, not elsewhere classified, right shoulder: Secondary | ICD-10-CM | POA: Diagnosis not present

## 2023-07-25 NOTE — Progress Notes (Signed)
Patient says that the day after the treatment she felt a bit better, but this relief did not last long. She says now that she feels as though the shoulder feels the same as it did before, no better or worse. She says that last week her pain was more in the back of the shoulder but now seems to be in the front as well. She says that she has been taking Advil consistently.

## 2023-07-25 NOTE — Progress Notes (Signed)
Kimberly Cox - 77 y.o. female MRN [id number]  Date of birth: August 25, 1946  Office Visit Note: Visit Date: 07/25/2023 PCP: Kimberly Grandchild, MD Referred by: Kimberly Grandchild, MD  Subjective: Chief Complaint  Patient presents with   Right Shoulder - Follow-up, Pain   HPI: Kimberly Cox is a pleasant 77 y.o. female who presents today for follow-up of right shoulder pain.  Last week we did proceed with our first trial of extracorporeal shockwave.  Had some light bruising, but after the first or second day she did notice some improvement.  She continues doing her home exercises for the shoulder a few times weekly.  She is using Advil once to twice daily for the shoulder and more so her low back.  He did have her MRI of the low back and does need with the back surgeon upcoming.  Pertinent ROS were reviewed with the patient and found to be negative unless otherwise specified above in HPI.   Assessment & Plan: Visit Diagnoses:  1. Chronic right shoulder pain   2. Rotator cuff arthropathy of right shoulder    Plan: Discussed with Kimberly Cox her chronic right shoulder pain which seems indicative of rotator cuff arthropathy, we did repeat our second trial of extracorporeal shockwave therapy as she has had an excellent relief from this from her lateral hip tendons in the past.  She will continue her rotator cuff and shoulder home rehab program at least every other day.  She may use her Celebrex or Advil as needed although discussed avoiding routine consistent use to not repeat the effects of shockwave.  She has 1 additional evaluation and shockwave treatment next week and after that visit we will see what sort of cumulative benefit she has going forward.  Other considerations if not finding improvement with the ultrasound of the rotator cuff tendons for evaluation, possible subacromial joint injection therapy   Follow-up: Return for has f/u next week for shoulder.   Meds & Orders: No orders of the  defined types were placed in this encounter.  No orders of the defined types were placed in this encounter.    Procedures: Procedure: ECSWT Indications:  Right rotator cuff tendinopathy   Procedure Details Consent: Risks of procedure as well as the alternatives and risks of each were explained to the patient.  Verbal consent for procedure obtained. Time Out: Verified patient identification, verified procedure, site was marked, verified correct patient position. The area was cleaned with alcohol swab.     The right shoulder and rotator cuff interval was targeted for Extracorporeal shockwave therapy.    Preset: Rotator cuff tendinopathy/Shoulder problems Power Level: 100 mJ Frequency: 12 Hz  Impulse/cycles: 3000 Head size: Regular   Patient tolerated procedure well without immediate complications.      Clinical History: No specialty comments available.  She reports that she has quit smoking. She has never used smokeless tobacco. No results for input(s): "HGBA1C", "LABURIC" in the last 8760 hours.  Objective:   Physical Exam  Gen: Well-appearing, in no acute distress; non-toxic CV:  Well-perfused. Warm.  Resp: Breathing unlabored on room air; no wheezing. Psych: Fluid speech in conversation; appropriate affect; normal thought process Neuro: Sensation intact throughout. No gross coordination deficits.   Ortho Exam - Right shoulder: + TTP at Codman's point.  There is some resolving superficial ecchymosis around the lateral deltoid.  There is some pain with resisted ER and drop arm testing.  Imaging: No results found.  Past Medical/Family/Surgical/Social History: Medications &  Allergies reviewed per EMR, new medications updated. Patient Active Problem List   Diagnosis Date Noted   Diuretic-induced hypokalemia 03/31/2020   Sciatica of right side 05/01/2018   Piriformis syndrome of left side 11/27/2017   GERD with esophagitis 08/22/2017   Asthmatic bronchitis 08/22/2017    Prediabetes 02/02/2017   Pure hyperglyceridemia 02/02/2017   Degenerative cervical disc 01/26/2016   Degenerative lumbar disc 01/26/2016   Spinal stenosis of lumbar region with radiculopathy 01/20/2016   Routine general medical examination at a health care facility 07/18/2014   Hereditary and idiopathic peripheral neuropathy 01/31/2008   Hyperlipidemia LDL goal <130 12/05/2007   Essential hypertension 12/05/2007   Past Medical History:  Diagnosis Date   Anxiety    Anxiety disorder    Concussion Dec 13th, 2011   fell, striking her head against a wall, developed dizziness - UC eval c/w concussion. Had follow-up and did ok.    History of chest pain    Hyperlipidemia    Hypertension    Plantar fasciitis    Spinal stenosis, lumbar    Family History  Problem Relation Age of Onset   Cancer Mother    Coronary artery disease Father    Past Surgical History:  Procedure Laterality Date   ARTHRODESIS METATARSALPHALANGEAL JOINT (MTPJ) Right 10/08/2015   Procedure: RIGHT HALLUX METATARSAL PHALANGEAL JOINT  ARTHRODESIS;  Surgeon: Toni Arthurs, MD;  [facility];  Service: Orthopedics;  Laterality: Right;   caesarean section     CESAREAN SECTION     HAMMERTOE RECONSTRUCTION WITH WEIL OSTEOTOMY Right 10/08/2015   Procedure: RIGHT SECOND METATARSAL WEIL HAMMERTOE CORRECTION;  Surgeon: Toni Arthurs, MD;  Location:  [facility];  Service: Orthopedics;  Laterality: Right;   TONSILLECTOMY     Social History   Occupational History   Not on file  Tobacco Use   Smoking status: Former   Smokeless tobacco: Never   Tobacco comments:    only in h.s.  Vaping Use   Vaping status: Never Used  Substance and Sexual Activity   Alcohol use: Yes    Comment: occ   Drug use: No   Sexual activity: Not Currently    Birth control/protection: Post-menopausal    Comment: 1st intercourse- 20, partners- 1

## 2023-07-26 ENCOUNTER — Ambulatory Visit: Payer: BC Managed Care – PPO | Admitting: Physical Therapy

## 2023-07-26 DIAGNOSIS — M5416 Radiculopathy, lumbar region: Secondary | ICD-10-CM | POA: Diagnosis not present

## 2023-08-01 ENCOUNTER — Encounter: Payer: Self-pay | Admitting: Sports Medicine

## 2023-08-01 ENCOUNTER — Ambulatory Visit: Payer: BC Managed Care – PPO | Admitting: Sports Medicine

## 2023-08-01 DIAGNOSIS — M12811 Other specific arthropathies, not elsewhere classified, right shoulder: Secondary | ICD-10-CM | POA: Diagnosis not present

## 2023-08-01 DIAGNOSIS — M48061 Spinal stenosis, lumbar region without neurogenic claudication: Secondary | ICD-10-CM | POA: Diagnosis not present

## 2023-08-01 DIAGNOSIS — G8929 Other chronic pain: Secondary | ICD-10-CM | POA: Diagnosis not present

## 2023-08-01 DIAGNOSIS — M25511 Pain in right shoulder: Secondary | ICD-10-CM

## 2023-08-01 DIAGNOSIS — M545 Low back pain, unspecified: Secondary | ICD-10-CM | POA: Diagnosis not present

## 2023-08-01 DIAGNOSIS — M5416 Radiculopathy, lumbar region: Secondary | ICD-10-CM

## 2023-08-01 MED ORDER — LIDOCAINE HCL 1 % IJ SOLN
2.0000 mL | INTRAMUSCULAR | Status: AC | PRN
  Administered 2023-08-01: 2 mL

## 2023-08-01 MED ORDER — METHYLPREDNISOLONE ACETATE 40 MG/ML IJ SUSP
40.0000 mg | INTRAMUSCULAR | Status: AC | PRN
  Administered 2023-08-01: 40 mg via INTRA_ARTICULAR

## 2023-08-01 MED ORDER — BUPIVACAINE HCL 0.25 % IJ SOLN
2.0000 mL | INTRAMUSCULAR | Status: AC | PRN
Start: 2023-08-01 — End: 2023-08-01
  Administered 2023-08-01: 2 mL via INTRA_ARTICULAR

## 2023-08-01 NOTE — Progress Notes (Signed)
Patient says that her shoulder is not feeling much better. She says that she does her home exercises, although is not doing much with the bands. She says that she recently had an appointment where she was told that the surgery for her back was not recommended, so is now in the process of trying to schedule a CT-guided injection. She says that she was prescribed Tramadol for back pain but has not begun taking it yet; she has taken Tylenol for shoulder pain.

## 2023-08-01 NOTE — Progress Notes (Signed)
Kimberly Cox - 77 y.o. female MRN [id number]  Date of birth: 1945-10-22  Office Visit Note: Visit Date: 08/01/2023 PCP: Etta Grandchild, MD Referred by: Etta Grandchild, MD  Subjective: Chief Complaint  Patient presents with   Right Shoulder - Pain, Follow-up   HPI: Kimberly Cox is a pleasant 77 y.o. female who presents today for follow-up of right shoulder pain.   We have performed 2 treatments of extracorporeal shockwave therapy - she is finding this only minimally helpful. Still having pain with reaching motions. She continues doing her home shoulder/rotator cuff exercises for the shoulder a few times weekly.  She does use Advil as needed, more so for she does use Advil as needed, more so for her low back pain and other ailments as well.  Pertinent ROS were reviewed with the patient and found to be negative unless otherwise specified above in HPI.   Assessment & Plan: Visit Diagnoses:  1. Chronic right shoulder pain   2. Rotator cuff arthropathy of right shoulder   3. Spinal stenosis of lumbar region with radiculopathy   4. Chronic bilateral low back pain, unspecified whether sciatica present    Plan: Jenavieve was only having minimal relief from extracorporeal shockwave therapy for her rotator cuff arthropathy and shoulder pain.  At this point, we decided to proceed with a subacromial joint injection to help calm this down and allow her to complete her home rotator cuff and shoulder rehab exercises.  Subacromial joint injection performed today.  She will take the next 48 hours of modified rest and activity and then starting on Friday and return to her consistent shoulder HEP.  She has been following a orthopedic spine physician at [facility] for her low back and lumbar stenosis, and they recommended CT-guided epidural injection. I personally reviewed her MRI today. I discussed with an outside spine physician, Dr. Yevette Edwards, that CT-guided injections can be referred to [facility] imaging per  his recs. I will message the patient and let her know if she can consider trying a referral here from her spine physician if they want to get her in sooner.  She may use over-the-counter Tylenol and or ibuprofen as needed. She will f/u with me in 48-month.  If the shoulder is not doing significantly better at that time, could consider either additional treatment sessions of extracorporeal shockwave therapy versus ultrasound-guided glenohumeral joint injection versus further evaluation with shoulder MRI.  Follow-up: Return in about 1 month (around 08/31/2023) for R-shoulder.   Meds & Orders: No orders of the defined types were placed in this encounter.   Orders Placed This Encounter  Procedures   Large Joint Inj     Procedures: - see procedure note below       Clinical History: No specialty comments available.  She reports that she has quit smoking. She has never used smokeless tobacco. No results for input(s): "HGBA1C", "LABURIC" in the last 8760 hours.  Objective:    Physical Exam  Gen: Well-appearing, in no acute distress; non-toxic CV: Well-perfused. Warm.  Resp: Breathing unlabored on room air; no wheezing. Psych: Fluid speech in conversation; appropriate affect; normal thought process Neuro: Sensation intact throughout. No gross coordination deficits.   Ortho Exam - Right shoulder: + TTP at Codman's point.  There is full active and passive range of motion with pain at endrange abduction.  Positive drop arm, positive empty can test.  There is mild pain with resisted external rotation, internal rotation intact.  There is some weakness  with resisted abduction and ER although secondary to pain. No effusion.  Procedure Note  Patient: Kimberly Cox             Date of Birth: [birth year]           MRN: [id number]             Visit Date: 08/01/2023  Procedures: Visit Diagnoses:  1. Chronic right shoulder pain   2. Rotator cuff arthropathy of right shoulder   3. Spinal stenosis  of lumbar region with radiculopathy   4. Chronic bilateral low back pain, unspecified whether sciatica present     Large Joint Inj: R subacromial bursa on 08/01/2023 9:51 AM Indications: pain Details: 22 G 1.5 in needle, posterior approach Medications: 2 mL lidocaine 1 %; 2 mL bupivacaine 0.25 %; 40 mg methylPREDNISolone acetate 40 MG/ML Outcome: tolerated well, no immediate complications  Subacromial Joint Injection, Right Shoulder After discussion on risks/benefits/indications, informed verbal consent was obtained. A timeout was then performed. Patient was seated on table in exam room. The patient's shoulder was prepped with betadine and alcohol swabs and utilizing posterior approach a 22G, 1.5" needle was directed anteriorly and laterally into the patient's subacromial space was injected with 2:2:1 mixture of lidocaine:bupivicaine:depomedrol with appreciation of free-flowing of the injectate into the bursal space. Patient tolerated the procedure well without immediate complications.   Procedure, treatment alternatives, risks and benefits explained, specific risks discussed. Consent was given by the patient. Immediately prior to procedure a time out was called to verify the correct patient, procedure, equipment, support staff and site/side marked as required. Patient was prepped and draped in the usual sterile fashion.     Imaging:  *Independent review and interpretation of lumbar MRI 07/21/2023 was performed today.  MRI demonstrates multilevel degenerative disc disease of the lumbar spine with moderate to severe lumbar stenosis.  There are multilevel posterior disc bulges from L2-S1, most notable at the L3-L4 level with neural encroachment.  06/13/23: 3 views of the right shoulder including AP, Grashey and axial view were  ordered and reviewed by myself.  There is at least moderate AC joint  arthritic change, mild glenohumeral joint arthritis.  Humeral head is well  located within the  groove.  There is no acute fracture or otherwise bony  abnormality noted.   Past Medical/Family/Surgical/Social History: Medications & Allergies reviewed per EMR, new medications updated. Patient Active Problem List   Diagnosis Date Noted   Diuretic-induced hypokalemia 03/31/2020   Sciatica of right side 05/01/2018   Piriformis syndrome of left side 11/27/2017   GERD with esophagitis 08/22/2017   Asthmatic bronchitis 08/22/2017   Prediabetes 02/02/2017   Pure hyperglyceridemia 02/02/2017   Degenerative cervical disc 01/26/2016   Degenerative lumbar disc 01/26/2016   Spinal stenosis of lumbar region with radiculopathy 01/20/2016   Routine general medical examination at a health care facility 07/18/2014   Hereditary and idiopathic peripheral neuropathy 01/31/2008   Hyperlipidemia LDL goal <130 12/05/2007   Essential hypertension 12/05/2007   Past Medical History:  Diagnosis Date   Anxiety    Anxiety disorder    Concussion Dec 13th, 2011   fell, striking her head against a wall, developed dizziness - UC eval c/w concussion. Had follow-up and did ok.    History of chest pain    Hyperlipidemia    Hypertension    Plantar fasciitis    Spinal stenosis, lumbar    Family History  Problem Relation Age of Onset   Cancer Mother  Coronary artery disease Father    Past Surgical History:  Procedure Laterality Date   ARTHRODESIS METATARSALPHALANGEAL JOINT (MTPJ) Right 10/08/2015   Procedure: RIGHT HALLUX METATARSAL PHALANGEAL JOINT  ARTHRODESIS;  Surgeon: Toni Arthurs, MD;  Location: [facility];  Service: Orthopedics;  Laterality: Right;   caesarean section     CESAREAN SECTION     HAMMERTOE RECONSTRUCTION WITH WEIL OSTEOTOMY Right 10/08/2015   Procedure: RIGHT SECOND METATARSAL WEIL HAMMERTOE CORRECTION;  Surgeon: Toni Arthurs, MD;  Location: [facility];  Service: Orthopedics;  Laterality: Right;   TONSILLECTOMY     Social History   Occupational History    Not on file  Tobacco Use   Smoking status: Former   Smokeless tobacco: Never   Tobacco comments:    only in h.s.  Vaping Use   Vaping status: Never Used  Substance and Sexual Activity   Alcohol use: Yes    Comment: occ   Drug use: No   Sexual activity: Not Currently    Birth control/protection: Post-menopausal    Comment: 1st intercourse- 20, partners- 1

## 2023-08-07 ENCOUNTER — Encounter: Payer: Self-pay | Admitting: Physical Therapy

## 2023-08-07 ENCOUNTER — Ambulatory Visit: Payer: BC Managed Care – PPO | Attending: Internal Medicine | Admitting: Physical Therapy

## 2023-08-07 ENCOUNTER — Encounter: Payer: Self-pay | Admitting: Sports Medicine

## 2023-08-07 DIAGNOSIS — M25551 Pain in right hip: Secondary | ICD-10-CM | POA: Insufficient documentation

## 2023-08-07 DIAGNOSIS — M6281 Muscle weakness (generalized): Secondary | ICD-10-CM | POA: Insufficient documentation

## 2023-08-07 DIAGNOSIS — R262 Difficulty in walking, not elsewhere classified: Secondary | ICD-10-CM | POA: Diagnosis not present

## 2023-08-07 NOTE — Therapy (Signed)
OUTPATIENT PHYSICAL THERAPY LOWER EXTREMITY    Patient Name: Kimberly Cox MRN: [id number] DOB:09-30-1945, 77 y.o., female Today's Date: 08/07/2023  END OF SESSION:  PT End of Session - 08/07/23 0958     Visit Number 18    Date for PT Re-Evaluation 10/06/23    Authorization Type BCBS    PT Start Time 0930    PT Stop Time 1015    PT Time Calculation (min) 45 min    Activity Tolerance Patient tolerated treatment well    Behavior During Therapy [facility] for tasks assessed/performed               Past Medical History:  Diagnosis Date   Anxiety    Anxiety disorder    Concussion Dec 13th, 2011   fell, striking her head against a wall, developed dizziness - UC eval c/w concussion. Had follow-up and did ok.    History of chest pain    Hyperlipidemia    Hypertension    Plantar fasciitis    Spinal stenosis, lumbar    Past Surgical History:  Procedure Laterality Date   ARTHRODESIS METATARSALPHALANGEAL JOINT (MTPJ) Right 10/08/2015   Procedure: RIGHT HALLUX METATARSAL PHALANGEAL JOINT  ARTHRODESIS;  Surgeon: Toni Arthurs, MD;  Location: [facility];  Service: Orthopedics;  Laterality: Right;   caesarean section     CESAREAN SECTION     HAMMERTOE RECONSTRUCTION WITH WEIL OSTEOTOMY Right 10/08/2015   Procedure: RIGHT SECOND METATARSAL WEIL HAMMERTOE CORRECTION;  Surgeon: Toni Arthurs, MD;  Location: [facility];  Service: Orthopedics;  Laterality: Right;   TONSILLECTOMY     Patient Active Problem List   Diagnosis Date Noted   Diuretic-induced hypokalemia 03/31/2020   Sciatica of right side 05/01/2018   Piriformis syndrome of left side 11/27/2017   GERD with esophagitis 08/22/2017   Asthmatic bronchitis 08/22/2017   Prediabetes 02/02/2017   Pure hyperglyceridemia 02/02/2017   Degenerative cervical disc 01/26/2016   Degenerative lumbar disc 01/26/2016   Spinal stenosis of lumbar region with radiculopathy 01/20/2016   Routine general medical examination  at a health care facility 07/18/2014   Hereditary and idiopathic peripheral neuropathy 01/31/2008   Hyperlipidemia LDL goal <130 12/05/2007   Essential hypertension 12/05/2007    PCP: Sanda Linger  REFERRING PROVIDER: Richardean Canal  REFERRING DIAG: M25.551 (ICD-10-CM) - Pain of right hip   THERAPY DIAG:  Difficulty in walking, not elsewhere classified  Muscle weakness (generalized)  Pain in right hip  Rationale for Evaluation and Treatment: Rehabilitation  ONSET DATE: 03/03/23  SUBJECTIVE:   SUBJECTIVE STATEMENT: Saw MD, feels like not going to do the surgery, she is awaiting decision on CT guided lumbar injection.  She does feel that the lumbar traction is helping her with overall less pain PERTINENT HISTORY: Spinal stenosis at L3-L4 intra-articular injection gave her good relief for about a week. She is also had 2 trochanteric injections first 1 gave her some relief second 1 gave her no relief. Patient denies any groin pain. Pain mostly lateral aspect of the hip. No radicular symptoms down the leg.   PAIN:  Are you having pain? Yes: NPRS scale: 4/10 Pain location: R hip, starting to travel down the leg  Pain description: dull, ache Aggravating factors: walking, steps Relieving factors: medication   PRECAUTIONS: None  WEIGHT BEARING RESTRICTIONS: No  FALLS:  Has patient fallen in last 6 months? No  LIVING ENVIRONMENT: Lives with: lives with their spouse Lives in: House/apartment Stairs: No Has following equipment at home:  Single point cane  OCCUPATION: Retired  PLOF: Independent  PATIENT GOALS: to get rid of the pain  NEXT MD VISIT: 03/20/23  OBJECTIVE:   DIAGNOSTIC FINDINGS: MRI right hip without contrast dated 03/01/2023 is reviewed with the patient.  Images reviewed. This shows right gluteus minimus tendon with severe tendinosis.  There is also high-grade partial-thickness tear at the insertion.  Mild right gluteus medius tendinosis without tear.  Mild  right hip osteoarthritis.  Trochanteric bursitis on the right is mild.  Mild edema is seen intermuscular portion of the proximal abductor compartment muscular EXTR.  Right greater than left.  Right gluteus medius muscle edema also seen felt to reflect low-grade strains.   Impression: Right hip severe tendinosis right gluteus minimus tendon with high-grade partial-thickness tear at the insertion. Right hip osteoarthritis mild  PATIENT SURVEYS:  FOTO 41  COGNITION: Overall cognitive status: Within functional limits for tasks assessed     SENSATION: WFL  MUSCLE LENGTH: Tightness in BLE  PALPATION: She has tenderness over the right greater trochanteric region.   LOWER EXTREMITY ROM: WFL    LOWER EXTREMITY MMT:  MMT Right eval Left eval  Hip flexion 4+ 5  Hip extension 4-   Hip abduction 4-   Hip adduction    Hip internal rotation    Hip external rotation    Knee flexion 5 5  Knee extension 5 5  Ankle dorsiflexion    Ankle plantarflexion    Ankle inversion    Ankle eversion     (Blank rows = not tested)  FUNCTIONAL TESTS:  5 times sit to stand: 12.43s   TODAY'S TREATMENT:                                                                                                                              DATE:  08/07/23 Passive stretch LE's Feet on ball K2C,rotation, bridge Isometric abs Tband clamshells Ball squeeze Supine tband hip extension to bed PPT Lumbar traction static 65#  07/17/23 Nustep level 5 x 5 minutes 5# straight arm pull 10# seated row 15# lat pulls Red tband ER Passive stretch Hs and piriformis Feet on ball K2C, trunk rotation, bridge, isometric abs Supine green tband hip extension 62# static lumbar traction  07/10/23 Walk outside around the parking [place] and to the front door Passive stretch to the LE's Feet on ball K2C, rotation, bridge and isometric abs Green tband  clamshells Ball b/n knees squeeze Static lumbar traction  55#  07/05/23 Reviewed HEP and posture body mechanics that were discussed last visit STM to the glutes and pspinals Passive stretches LE's Lumbar traction 55#   06/28/23 Updated HEP and performed including supine ball exercises Passive stretch Lumbar traction static 55# Review and demo of all proper ADL's with body mechanic instruction for lifting, bed mobility, vacuum, dishes, golfers lift etc..  06/26/23 Bike level 4 x 5 minutes 5# straight arm pulls caused pain being tight in the core  5#  AR press again pain  Feet on ball K2C, trunk rotation, isometric abs Passive LE stretches Static lumbar traction 55#  06/15/23 Patient brought in TENS, I educated on use, placement, batteries, modes, time, precautions, contraindications , placed it on her and tried the different modes, then had her return demo Passive stretch of the LE's PPT Isometric abs Ball b/n knees Static lumbar traction 53#  06/12/23 Passive stretch of the LE's and trunk PPT, isometric abs MHP/IFC to the right low back and right buttock Static lumbar traction 53# PATIENT EDUCATION:  Education details: HEP and POC Person educated: Patient Education method: Explanation Education comprehension: verbalized understanding  HOME EXERCISE PROGRAM: Access Code: [email or web address] URL: [email or web address] Date: 06/28/2023 Prepared by: Stacie Glaze  Exercises - Supine Bridge  - 1 x daily - 7 x weekly - 2 sets - 10 reps - 3 hold - Supine Active Straight Leg Raise  - 1 x daily - 7 x weekly - 2 sets - 10 reps - 3 hold - Standing Row with Anchored Resistance  - 1 x daily - 7 x weekly - 2 sets - 10 reps - 3 hold - Shoulder Extension with Resistance  - 1 x daily - 7 x weekly - 2 sets - 10 reps - 3 hold - Standing Repeated Hip Extension with Resistance  - 1 x daily - 7 x weekly - 2 sets - 10 reps - 3 hold - Standing Repeated Hip Abduction with Resistance  - 1 x daily - 7 x weekly - 2 sets - 10 reps - 3 hold -  Supine March  - 1 x daily - 7 x weekly - 2 sets - 10 reps - 3 hold   Access Code: [email or web address] URL: [email or web address] Date: 04/27/2023 Prepared by: Stacie Glaze  Exercises - Seated Hamstring Stretch with Chair  - 1 x daily - 7 x weekly - 2 sets - 5 reps - 30 hold - Seated Table Hamstring Stretch  - 1 x daily - 7 x weekly - 2 sets - 5 reps - 30 hold - Supine Piriformis Stretch Pulling Heel to Hip  - 1 x daily - 7 x weekly - 2 sets - 5 reps - 15 hold - Supine Hip Adductor Stretch  - 1 x daily - 7 x weekly - 2 sets - 5 reps - 15 hold   Access Code: [email or web address] URL: [email or web address] Date: 03/15/2023 Prepared by: Cassie Freer  Exercises - Supine Bridge  - 1 x daily - 7 x weekly - 2 sets - 10 reps - Beginner Side Leg Lift  - 1 x daily - 7 x weekly - 2 sets - 10 reps - Clamshell with Resistance  - 1 x daily - 7 x weekly - 2 sets - 10 reps - Supine Piriformis Stretch with Foot on Ground  - 1 x daily - 7 x weekly - 2 reps - 15-30 hold  ASSESSMENT:  CLINICAL IMPRESSION:  Patient reports that she saw the MD, feels like he would want to avoid surgery, she is planning on having a CT guided injection soon, but has not had this scheduled.  She has a few calls out to MD's to try to get this set up.  She does report some right RC issues that she is dealing with as well.  She does still have pain and tightness in the groin OBJECTIVE IMPAIRMENTS: difficulty walking, decreased strength, and pain.   REHAB POTENTIAL: Good  CLINICAL DECISION MAKING: Stable/uncomplicated  EVALUATION COMPLEXITY: Low   GOALS:  Goals reviewed with patient? Yes  SHORT TERM GOALS: Target date: 04/12/23  Patient will be independent with initial HEP. Goal status: 04/25/23 MET   LONG TERM GOALS: Target date: 05/17/23  Patient will be independent with advanced/ongoing HEP to improve outcomes and carryover.  Goal status: progressing 08/07/23  2.  Patient will report at least 75%  improvement in R hip pain to improve QOL. Baseline: 4/10 pain /Goal status: Progressing 08/07/23  3.  Patient will demonstrate improved functional R hip strength as demonstrated by 5/5 for hip extension and abduction. Baseline: 4-/5 Goal status: 04/25/23 MET  4.  Patient will be able to ambulate 500' with normal gait pattern and no pain  Baseline: pain with walking Goal status: 04/25/23 MET  5.  Patient will report 59 on FOTO (patient reported outcome measure) to demonstrate improved functional ability. Baseline: 41 Goal status: met 07/05/23   PLAN:  PT FREQUENCY: 2x/week  PT DURATION: 8 weeks  PLANNED INTERVENTIONS: Therapeutic exercises, Therapeutic activity, Neuromuscular re-education, Balance training, Gait training, Patient/Family education, Self Care, Joint mobilization, Stair training, Dry Needling, Electrical stimulation, Cryotherapy, Moist heat, Ultrasound, Ionotophoresis 4mg /ml Dexamethasone, and Manual therapy  PLAN FOR NEXT SESSION: Will continue with the current plan add strength as tolerated  Jearld Lesch, PT 08/07/2023, 9:59 AM  [facility] at [facility] [address] [facility]. [place], [place], [address] Phone: [phone]   Fax:  [phone]

## 2023-08-08 ENCOUNTER — Other Ambulatory Visit: Payer: Self-pay | Admitting: Orthopedic Surgery

## 2023-08-08 ENCOUNTER — Ambulatory Visit: Payer: BC Managed Care – PPO | Admitting: Sports Medicine

## 2023-08-08 DIAGNOSIS — M5416 Radiculopathy, lumbar region: Secondary | ICD-10-CM

## 2023-08-09 ENCOUNTER — Ambulatory Visit
Admission: RE | Admit: 2023-08-09 | Discharge: 2023-08-09 | Disposition: A | Discharge status: Home / Self Care | Payer: BC Managed Care – PPO | Source: Ambulatory Visit | Attending: Internal Medicine | Admitting: Internal Medicine

## 2023-08-09 DIAGNOSIS — Z1231 Encounter for screening mammogram for malignant neoplasm of breast: Secondary | ICD-10-CM

## 2023-08-10 DIAGNOSIS — H5213 Myopia, bilateral: Secondary | ICD-10-CM | POA: Diagnosis not present

## 2023-08-10 DIAGNOSIS — H26493 Other secondary cataract, bilateral: Secondary | ICD-10-CM | POA: Diagnosis not present

## 2023-08-10 DIAGNOSIS — H353111 Nonexudative age-related macular degeneration, right eye, early dry stage: Secondary | ICD-10-CM | POA: Diagnosis not present

## 2023-08-21 ENCOUNTER — Other Ambulatory Visit: Payer: Self-pay | Admitting: Orthopedic Surgery

## 2023-08-21 DIAGNOSIS — M5416 Radiculopathy, lumbar region: Secondary | ICD-10-CM

## 2023-08-23 ENCOUNTER — Encounter: Payer: Self-pay | Admitting: Physical Therapy

## 2023-08-23 ENCOUNTER — Ambulatory Visit: Payer: BC Managed Care – PPO | Admitting: Physical Therapy

## 2023-08-23 DIAGNOSIS — M6281 Muscle weakness (generalized): Secondary | ICD-10-CM | POA: Diagnosis not present

## 2023-08-23 DIAGNOSIS — R262 Difficulty in walking, not elsewhere classified: Secondary | ICD-10-CM | POA: Diagnosis not present

## 2023-08-23 DIAGNOSIS — M25551 Pain in right hip: Secondary | ICD-10-CM | POA: Diagnosis not present

## 2023-08-23 NOTE — Therapy (Signed)
OUTPATIENT PHYSICAL THERAPY LOWER EXTREMITY    Patient Name: Kimberly Cox MRN: [id number] DOB:Oct 26, 1945, 77 y.o., female Today's Date: 08/23/2023  END OF SESSION:  PT End of Session - 08/23/23 0843     Visit Number 19    Date for PT Re-Evaluation 10/06/23    Authorization Type BCBS    PT Start Time 0840    PT Stop Time 0925    PT Time Calculation (min) 45 min    Activity Tolerance Patient tolerated treatment well    Behavior During Therapy [facility] for tasks assessed/performed               Past Medical History:  Diagnosis Date   Anxiety    Anxiety disorder    Concussion Dec 13th, 2011   fell, striking her head against a wall, developed dizziness - UC eval c/w concussion. Had follow-up and did ok.    History of chest pain    Hyperlipidemia    Hypertension    Plantar fasciitis    Spinal stenosis, lumbar    Past Surgical History:  Procedure Laterality Date   ARTHRODESIS METATARSALPHALANGEAL JOINT (MTPJ) Right 10/08/2015   Procedure: RIGHT HALLUX METATARSAL PHALANGEAL JOINT  ARTHRODESIS;  Surgeon: Toni Arthurs, MD;  Location: [facility];  Service: Orthopedics;  Laterality: Right;   caesarean section     CESAREAN SECTION     HAMMERTOE RECONSTRUCTION WITH WEIL OSTEOTOMY Right 10/08/2015   Procedure: RIGHT SECOND METATARSAL WEIL HAMMERTOE CORRECTION;  Surgeon: Toni Arthurs, MD;  Location: [facility];  Service: Orthopedics;  Laterality: Right;   TONSILLECTOMY     Patient Active Problem List   Diagnosis Date Noted   Diuretic-induced hypokalemia 03/31/2020   Sciatica of right side 05/01/2018   Piriformis syndrome of left side 11/27/2017   GERD with esophagitis 08/22/2017   Asthmatic bronchitis 08/22/2017   Prediabetes 02/02/2017   Pure hyperglyceridemia 02/02/2017   Degenerative cervical disc 01/26/2016   Degenerative lumbar disc 01/26/2016   Spinal stenosis of lumbar region with radiculopathy 01/20/2016   Routine general medical examination  at a health care facility 07/18/2014   Hereditary and idiopathic peripheral neuropathy 01/31/2008   Hyperlipidemia LDL goal <130 12/05/2007   Essential hypertension 12/05/2007    PCP: Sanda Linger  REFERRING PROVIDER: Richardean Canal  REFERRING DIAG: M25.551 (ICD-10-CM) - Pain of right hip   THERAPY DIAG:  Difficulty in walking, not elsewhere classified  Muscle weakness (generalized)  Pain in right hip  Rationale for Evaluation and Treatment: Rehabilitation  ONSET DATE: 03/03/23  SUBJECTIVE:   SUBJECTIVE STATEMENT: Will have CT guided lumbar injection on January 3, reports that she is overall still doing well, not great, but not as bad as she was PERTINENT HISTORY: Spinal stenosis at L3-L4 intra-articular injection gave her good relief for about a week. She is also had 2 trochanteric injections first 1 gave her some relief second 1 gave her no relief. Patient denies any groin pain. Pain mostly lateral aspect of the hip. No radicular symptoms down the leg.   PAIN:  Are you having pain? Yes: NPRS scale: 4/10 Pain location: R hip, starting to travel down the leg  Pain description: dull, ache Aggravating factors: walking, steps Relieving factors: medication   PRECAUTIONS: None  WEIGHT BEARING RESTRICTIONS: No  FALLS:  Has patient fallen in last 6 months? No  LIVING ENVIRONMENT: Lives with: lives with their spouse Lives in: House/apartment Stairs: No Has following equipment at home: Single point cane  OCCUPATION: Retired  PLOF:  Independent  PATIENT GOALS: to get rid of the pain  NEXT MD VISIT: 03/20/23  OBJECTIVE:   DIAGNOSTIC FINDINGS: MRI right hip without contrast dated 03/01/2023 is reviewed with the patient.  Images reviewed. This shows right gluteus minimus tendon with severe tendinosis.  There is also high-grade partial-thickness tear at the insertion.  Mild right gluteus medius tendinosis without tear.  Mild right hip osteoarthritis.  Trochanteric bursitis on  the right is mild.  Mild edema is seen intermuscular portion of the proximal abductor compartment muscular EXTR.  Right greater than left.  Right gluteus medius muscle edema also seen felt to reflect low-grade strains.   Impression: Right hip severe tendinosis right gluteus minimus tendon with high-grade partial-thickness tear at the insertion. Right hip osteoarthritis mild  PATIENT SURVEYS:  FOTO 41  COGNITION: Overall cognitive status: Within functional limits for tasks assessed     SENSATION: WFL  MUSCLE LENGTH: Tightness in BLE  PALPATION: She has tenderness over the right greater trochanteric region.   LOWER EXTREMITY ROM: WFL    LOWER EXTREMITY MMT:  MMT Right eval Left eval Right 08/23/23  Hip flexion 4+ 5   Hip extension 4-  4  Hip abduction 4-  4  Hip adduction     Hip internal rotation     Hip external rotation     Knee flexion 5 5   Knee extension 5 5   Ankle dorsiflexion     Ankle plantarflexion     Ankle inversion     Ankle eversion      (Blank rows = not tested)  FUNCTIONAL TESTS:  5 times sit to stand: 12.43s 08/23/23:  11 seconds   TODAY'S TREATMENT:                                                                                                                              DATE:  08/23/23 Nustep level 5 x 6 mintues 15# leg curls 15# rows 15# lats Feet on ball K2C, rotation, small bridges, isometric abs Green tband clamshells Ball b/n knees squeeze Passive LE stretches SLR 68 degrees Static lumbar traction 50#   08/07/23 Passive stretch LE's Feet on ball K2C,rotation, bridge Isometric abs Tband clamshells Ball squeeze Supine tband hip extension to bed PPT Lumbar traction static 65#  07/17/23 Nustep level 5 x 5 minutes 5# straight arm pull 10# seated row 15# lat pulls Red tband ER Passive stretch Hs and piriformis Feet on ball K2C, trunk rotation, bridge, isometric abs Supine green tband hip extension 62# static lumbar  traction  07/10/23 Walk outside around the parking [place] and to the front door Passive stretch to the LE's Feet on ball K2C, rotation, bridge and isometric abs Green tband  clamshells Ball b/n knees squeeze Static lumbar traction 55#  07/05/23 Reviewed HEP and posture body mechanics that were discussed last visit STM to the glutes and pspinals Passive stretches LE's Lumbar traction 55#   06/28/23 Updated HEP and  performed including supine ball exercises Passive stretch Lumbar traction static 55# Review and demo of all proper ADL's with body mechanic instruction for lifting, bed mobility, vacuum, dishes, golfers lift etc..  06/26/23 Bike level 4 x 5 minutes 5# straight arm pulls caused pain being tight in the core  5# AR press again pain  Feet on ball K2C, trunk rotation, isometric abs Passive LE stretches Static lumbar traction 55#  06/15/23 Patient brought in TENS, I educated on use, placement, batteries, modes, time, precautions, contraindications , placed it on her and tried the different modes, then had her return demo Passive stretch of the LE's PPT Isometric abs Ball b/n knees Static lumbar traction 53#  06/12/23 Passive stretch of the LE's and trunk PPT, isometric abs MHP/IFC to the right low back and right buttock Static lumbar traction 53# PATIENT EDUCATION:  Education details: HEP and POC Person educated: Patient Education method: Explanation Education comprehension: verbalized understanding  HOME EXERCISE PROGRAM: Access Code: [email or web address] URL: [email or web address] Date: 06/28/2023 Prepared by: Stacie Glaze  Exercises - Supine Bridge  - 1 x daily - 7 x weekly - 2 sets - 10 reps - 3 hold - Supine Active Straight Leg Raise  - 1 x daily - 7 x weekly - 2 sets - 10 reps - 3 hold - Standing Row with Anchored Resistance  - 1 x daily - 7 x weekly - 2 sets - 10 reps - 3 hold - Shoulder Extension with Resistance  - 1 x daily - 7 x weekly - 2  sets - 10 reps - 3 hold - Standing Repeated Hip Extension with Resistance  - 1 x daily - 7 x weekly - 2 sets - 10 reps - 3 hold - Standing Repeated Hip Abduction with Resistance  - 1 x daily - 7 x weekly - 2 sets - 10 reps - 3 hold - Supine March  - 1 x daily - 7 x weekly - 2 sets - 10 reps - 3 hold   Access Code: [email or web address] URL: [email or web address] Date: 04/27/2023 Prepared by: Stacie Glaze  Exercises - Seated Hamstring Stretch with Chair  - 1 x daily - 7 x weekly - 2 sets - 5 reps - 30 hold - Seated Table Hamstring Stretch  - 1 x daily - 7 x weekly - 2 sets - 5 reps - 30 hold - Supine Piriformis Stretch Pulling Heel to Hip  - 1 x daily - 7 x weekly - 2 sets - 5 reps - 15 hold - Supine Hip Adductor Stretch  - 1 x daily - 7 x weekly - 2 sets - 5 reps - 15 hold   Access Code: [email or web address] URL: [email or web address] Date: 03/15/2023 Prepared by: Cassie Freer  Exercises - Supine Bridge  - 1 x daily - 7 x weekly - 2 sets - 10 reps - Beginner Side Leg Lift  - 1 x daily - 7 x weekly - 2 sets - 10 reps - Clamshell with Resistance  - 1 x daily - 7 x weekly - 2 sets - 10 reps - Supine Piriformis Stretch with Foot on Ground  - 1 x daily - 7 x weekly - 2 reps - 15-30 hold  ASSESSMENT:  CLINICAL IMPRESSION:  Patient reports that she saw the MD, feels like he would want to avoid surgery, she is planning on having a CT guided injection soon,scheduled for Jan 3rd.  Overall she is doing better, still limited strength and ability to walk distances, with some hip tightness.  We continue to address this and she is progressing again trying to avoid surgery OBJECTIVE IMPAIRMENTS: difficulty walking, decreased strength, and pain.   REHAB POTENTIAL: Good  CLINICAL DECISION MAKING: Stable/uncomplicated  EVALUATION COMPLEXITY: Low   GOALS: Goals reviewed with patient? Yes  SHORT TERM GOALS: Target date: 04/12/23  Patient will be independent with initial HEP. Goal  status: 04/25/23 MET   LONG TERM GOALS: Target date: 05/17/23  Patient will be independent with advanced/ongoing HEP to improve outcomes and carryover.  Goal status: progressing 08/23/23  2.  Patient will report at least 75% improvement in R hip pain to improve QOL. Baseline: 4/10 pain /Goal status: Progressing 08/23/23  3.  Patient will demonstrate improved functional R hip strength as demonstrated by 5/5 for hip extension and abduction. Baseline: 4-/5 Goal status: 04/25/23 MET  4.  Patient will be able to ambulate 500' with normal gait pattern and no pain  Baseline: pain with walking Goal status: 04/25/23 MET  5.  Patient will report 79 on FOTO (patient reported outcome measure) to demonstrate improved functional ability. Baseline: 41 Goal status: met 07/05/23   PLAN:  PT FREQUENCY: 2x/week  PT DURATION: 8 weeks  PLANNED INTERVENTIONS: Therapeutic exercises, Therapeutic activity, Neuromuscular re-education, Balance training, Gait training, Patient/Family education, Self Care, Joint mobilization, Stair training, Dry Needling, Electrical stimulation, Cryotherapy, Moist heat, Ultrasound, Ionotophoresis 4mg /ml Dexamethasone, and Manual therapy  PLAN FOR NEXT SESSION: She will have a lumbar injection in January we will see her after this.    Jearld Lesch, PT 08/23/2023, 8:44 AM  [facility] at [facility] [facility]. [place], [place], [address] Phone: [phone]   Fax:  [phone]

## 2023-08-31 ENCOUNTER — Ambulatory Visit: Payer: BC Managed Care – PPO | Admitting: Physical Therapy

## 2023-09-04 ENCOUNTER — Ambulatory Visit (INDEPENDENT_AMBULATORY_CARE_PROVIDER_SITE_OTHER): Payer: BC Managed Care – PPO | Admitting: Sports Medicine

## 2023-09-04 ENCOUNTER — Encounter: Payer: Self-pay | Admitting: Sports Medicine

## 2023-09-04 DIAGNOSIS — M25511 Pain in right shoulder: Secondary | ICD-10-CM

## 2023-09-04 DIAGNOSIS — G8929 Other chronic pain: Secondary | ICD-10-CM

## 2023-09-04 DIAGNOSIS — M12811 Other specific arthropathies, not elsewhere classified, right shoulder: Secondary | ICD-10-CM | POA: Diagnosis not present

## 2023-09-04 NOTE — Progress Notes (Signed)
Patient says that her shoulder felt about 90-95% better after the injection, but this relief only lasted a couple of weeks. She says that the pain gradually returned and she has felt it more in the front and side of the shoulder with extension and overhead movements. She describes the pain as similar to a toothache. She says that she does her home exercises every couple of days.

## 2023-09-04 NOTE — Progress Notes (Signed)
Kimberly Cox - 77 y.o. female MRN [id number]  Date of birth: 07-12-1946  Office Visit Note: Visit Date: 09/04/2023 PCP: Etta Grandchild, MD Referred by: Etta Grandchild, MD  Subjective: Chief Complaint  Patient presents with   Right Shoulder - Follow-up   HPI: Kimberly Cox is a pleasant 77 y.o. female who presents today for follow-up of her chronic right shoulder pain.  On 08/01/23 - had SAJ injection which relieved her pain by 90-95%.  Unfortunately after about 3 to 4 weeks for relief as slowly started to dissipate.  Pain is mostly over the front part of the shoulder and worse with extension and certain reaching/overhead movements.  The sharp pain is improved but still has more of a dull ache.  She has done her home exercises a few days a week but not daily.  She has been using Advil.  Also has Celebrex which she has at home but has not used recently.  Does have her upcoming CT guided lumbar injection at [facility] this upcoming Friday.  Pertinent ROS were reviewed with the patient and found to be negative unless otherwise specified above in HPI.   Assessment & Plan: Visit Diagnoses:  1. Chronic right shoulder pain   2. Rotator cuff arthropathy of right shoulder    Plan: Impression is chronic right shoulder pain with likely underlying rotator cuff arthropathy which had quite significant relief from subacromial joint injection although only temporary x 3-4 weeks and now pain is slowly starting to return.  She has been doing some home exercises, but does admit she has not been super consistent with them.  We discussed getting her started into formalized physical therapy to be more consistent with her treatment as well as work on other soft tissue modalities, referral sent today.  We discussed possible further evaluation with shoulder MRI, we will hold on this for now but could consider.  We had a discussion regarding ultrasound-guided glenohumeral joint injection versus possible  PRP injection therapy for the rotator cuff, would want MRI or further advanced imaging prior to this.  She will become more consistent with her home exercises and start her formal PT and we will follow-up in about 1 month to see how she is doing.  She will call or return sooner if any issues arise.  She will discontinue her Advil and try Celebrex 100 mg once to twice daily as needed for pain control to see if this gives her better relief.  Follow-up: Return in about 1 month (around 10/05/2023) for R-shoulder .   Meds & Orders: No orders of the defined types were placed in this encounter.  No orders of the defined types were placed in this encounter.    Procedures: No procedures performed      Clinical History: No specialty comments available.  She reports that she has quit smoking. She has never used smokeless tobacco. No results for input(s): "HGBA1C", "LABURIC" in the last 8760 hours.  Objective:    Physical Exam  Gen: Well-appearing, in no acute distress; non-toxic CV: Well-perfused. Warm.  Resp: Breathing unlabored on room air; no wheezing. Psych: Fluid speech in conversation; appropriate affect; normal thought process  Ortho Exam - Right shoulder: + TTP at Codman's point and over the anterior humerus near the insertion of the subscapularis.  There is near full active and passive range of motion with some pain at endrange flexion and abduction.  Positive Gerber liftoff, there is associated pain with resisted internal rotation and  Hawkins impingement testing.  Imaging:  - 06/13/23: 3 views of the right shoulder including AP, Grashey and axial view were  ordered and reviewed by myself.  There is at least moderate AC joint  arthritic change, mild glenohumeral joint arthritis.  Humeral head is well  located within the groove.  There is no acute fracture or otherwise bony  abnormality noted.    Past Medical/Family/Surgical/Social History: Medications & Allergies reviewed per EMR,  new medications updated. Patient Active Problem List   Diagnosis Date Noted   Diuretic-induced hypokalemia 03/31/2020   Sciatica of right side 05/01/2018   Piriformis syndrome of left side 11/27/2017   GERD with esophagitis 08/22/2017   Asthmatic bronchitis 08/22/2017   Prediabetes 02/02/2017   Pure hyperglyceridemia 02/02/2017   Degenerative cervical disc 01/26/2016   Degenerative lumbar disc 01/26/2016   Spinal stenosis of lumbar region with radiculopathy 01/20/2016   Routine general medical examination at a health care facility 07/18/2014   Hereditary and idiopathic peripheral neuropathy 01/31/2008   Hyperlipidemia LDL goal <130 12/05/2007   Essential hypertension 12/05/2007   Past Medical History:  Diagnosis Date   Anxiety    Anxiety disorder    Concussion Dec 13th, 2011   fell, striking her head against a wall, developed dizziness - UC eval c/w concussion. Had follow-up and did ok.    History of chest pain    Hyperlipidemia    Hypertension    Plantar fasciitis    Spinal stenosis, lumbar    Family History  Problem Relation Age of Onset   Cancer Mother    Coronary artery disease Father    Past Surgical History:  Procedure Laterality Date   ARTHRODESIS METATARSALPHALANGEAL JOINT (MTPJ) Right 10/08/2015   Procedure: RIGHT HALLUX METATARSAL PHALANGEAL JOINT  ARTHRODESIS;  Surgeon: Toni Arthurs, MD;  Location:  SUR[facility]ERY [facility];  Service: Orthopedics;  Laterality: Right;   caesarean section     CESAREAN SECTION     HAMMERTOE RECONSTRUCTION WITH WEIL OSTEOTOMY Right 10/08/2015   Procedure: RIGHT SECOND METATARSAL WEIL HAMMERTOE CORRECTION;  Surgeon: Toni Arthurs, MD;  Location:  SURGERY CENTER;  Service: Orthopedics;  Laterality: Right;   TONSILLECTOMY     Social History   Occupational History   Not on file  Tobacco Use   Smoking status: Former   Smokeless tobacco: Never   Tobacco comments:    only in h.s.  Vaping Use   Vaping status: Never Used   Substance and Sexual Activity   Alcohol use: Yes    Comment: occ   Drug use: No   Sexual activity: Not Currently    Birth control/protection: Post-menopausal    Comment: 1st intercourse- 20, partners- 1   I spent 33 minutes in the care of the patient today including face-to-face time, preparation to see the patient, as well as review of previous x-ray, discussion on MRI or further advanced imaging, discussion on possible PRP injection therapy, medication management for the above diagnoses.   Madelyn Brunner, DO Primary Care Sports Medicine Physician  [facility] - Orthopedics  This note was dictated using Dragon naturally speaking software and may contain errors in syntax, spelling, or content which have not been identified prior to signing this note.

## 2023-09-05 ENCOUNTER — Other Ambulatory Visit: Payer: Self-pay | Admitting: Orthopedic Surgery

## 2023-09-05 ENCOUNTER — Inpatient Hospital Stay
Admission: RE | Admit: 2023-09-05 | Discharge: 2023-09-05 | Disposition: A | Discharge status: Home / Self Care | Payer: BC Managed Care – PPO | Source: Ambulatory Visit | Attending: Orthopedic Surgery | Admitting: Orthopedic Surgery

## 2023-09-05 DIAGNOSIS — M4727 Other spondylosis with radiculopathy, lumbosacral region: Secondary | ICD-10-CM | POA: Diagnosis not present

## 2023-09-05 DIAGNOSIS — M5416 Radiculopathy, lumbar region: Secondary | ICD-10-CM

## 2023-09-05 MED ORDER — IOPAMIDOL (ISOVUE-M 200) INJECTION 41%
1.0000 mL | Freq: Once | INTRAMUSCULAR | Status: AC
  Administered 2023-09-05: 1 mL via EPIDURAL

## 2023-09-05 MED ORDER — METHYLPREDNISOLONE ACETATE 40 MG/ML INJ SUSP (RADIOLOG
80.0000 mg | Freq: Once | INTRAMUSCULAR | Status: AC
  Administered 2023-09-05: 80 mg via EPIDURAL

## 2023-09-05 NOTE — Discharge Instructions (Signed)

## 2023-09-07 ENCOUNTER — Ambulatory Visit: Payer: BC Managed Care – PPO | Admitting: Physical Therapy

## 2023-09-08 ENCOUNTER — Other Ambulatory Visit: Payer: Self-pay | Admitting: Orthopaedic Surgery

## 2023-09-08 ENCOUNTER — Other Ambulatory Visit: Payer: BC Managed Care – PPO

## 2023-09-12 ENCOUNTER — Encounter: Payer: Self-pay | Admitting: Physical Therapy

## 2023-09-12 ENCOUNTER — Ambulatory Visit: Payer: BC Managed Care – PPO | Attending: Internal Medicine | Admitting: Physical Therapy

## 2023-09-12 DIAGNOSIS — R252 Cramp and spasm: Secondary | ICD-10-CM | POA: Insufficient documentation

## 2023-09-12 DIAGNOSIS — M12811 Other specific arthropathies, not elsewhere classified, right shoulder: Secondary | ICD-10-CM | POA: Diagnosis not present

## 2023-09-12 DIAGNOSIS — R262 Difficulty in walking, not elsewhere classified: Secondary | ICD-10-CM | POA: Insufficient documentation

## 2023-09-12 DIAGNOSIS — M25551 Pain in right hip: Secondary | ICD-10-CM | POA: Diagnosis not present

## 2023-09-12 DIAGNOSIS — M6281 Muscle weakness (generalized): Secondary | ICD-10-CM | POA: Insufficient documentation

## 2023-09-12 DIAGNOSIS — M25511 Pain in right shoulder: Secondary | ICD-10-CM | POA: Diagnosis not present

## 2023-09-12 DIAGNOSIS — G8929 Other chronic pain: Secondary | ICD-10-CM | POA: Diagnosis not present

## 2023-09-12 DIAGNOSIS — M25611 Stiffness of right shoulder, not elsewhere classified: Secondary | ICD-10-CM | POA: Insufficient documentation

## 2023-09-12 NOTE — Therapy (Signed)
 OUTPATIENT PHYSICAL THERAPY SHOULDER EVALUATION   Patient Name: Kimberly Cox MRN: [id number] DOB:[birth year], 78 y.o., female Today's Date: 09/12/2023  END OF SESSION:  PT End of Session - 09/12/23 0848     Visit Number 1    Date for PT Re-Evaluation 11/04/23    Authorization Type BCBS    PT Start Time [phone]    PT Stop Time 0930    PT Time Calculation (min) 49 min    Activity Tolerance Patient tolerated treatment well    Behavior During Therapy [facility] for tasks assessed/performed             Past Medical History:  Diagnosis Date   Anxiety    Anxiety disorder    Concussion Dec 13th, 2011   fell, striking her head against a wall, developed dizziness - UC eval c/w concussion. Had follow-up and did ok.    History of chest pain    Hyperlipidemia    Hypertension    Plantar fasciitis    Spinal stenosis, lumbar    Past Surgical History:  Procedure Laterality Date   ARTHRODESIS METATARSALPHALANGEAL JOINT (MTPJ) Right 10/08/2015   Procedure: RIGHT HALLUX METATARSAL PHALANGEAL JOINT  ARTHRODESIS;  Surgeon: Norleen Armor, MD;  Location: [facility];  Service: Orthopedics;  Laterality: Right;   caesarean section     CESAREAN SECTION     HAMMERTOE RECONSTRUCTION WITH WEIL OSTEOTOMY Right 10/08/2015   Procedure: RIGHT SECOND METATARSAL WEIL HAMMERTOE CORRECTION;  Surgeon: Norleen Armor, MD;  Location: [facility];  Service: Orthopedics;  Laterality: Right;   TONSILLECTOMY     Patient Active Problem List   Diagnosis Date Noted   Diuretic-induced hypokalemia 03/31/2020   Sciatica of right side 05/01/2018   Piriformis syndrome of left side 11/27/2017   GERD with esophagitis 08/22/2017   Asthmatic bronchitis 08/22/2017   Prediabetes 02/02/2017   Pure hyperglyceridemia 02/02/2017   Degenerative cervical disc 01/26/2016   Degenerative lumbar disc 01/26/2016   Spinal stenosis of lumbar region with radiculopathy 01/20/2016   Routine general medical examination at a  health care facility 07/18/2014   Hereditary and idiopathic peripheral neuropathy 01/31/2008   Hyperlipidemia LDL goal <130 12/05/2007   Essential hypertension 12/05/2007    PCP: IVAR Molt, MD  REFERRING PROVIDER: Lonell Sprang, DO  REFERRING DIAG: right shoulder pain  THERAPY DIAG:  Chronic right shoulder pain  Stiffness of right shoulder, not elsewhere classified  Spasm  Muscle weakness (generalized)  Rationale for Evaluation and Treatment: Rehabilitation  ONSET DATE: 09/04/23  SUBJECTIVE:  SUBJECTIVE STATEMENT: We have been seeing the patient for back and hip issues in the past year, has a new order for right shoulder pain, x-rays show degenerative changes with arthritis of the A/C joint and shoulder.  Reports that she feels like she was doing an exercise machine and did too big of a motion.  This started back in June  Hand dominance: Right  PERTINENT HISTORY: See above  PAIN:  Are you having pain? Yes: NPRS scale: 2 Pain location: right shoulder Pain description: ache Aggravating factors: reaching, sometimes ache with sitting, dressing, doing hair, opening car door, pain up to 7/10 Relieving factors: nothing really helped except not moving  PRECAUTIONS: None  RED FLAGS: None   WEIGHT BEARING RESTRICTIONS: No  FALLS:  Has patient fallen in last 6 months? No  LIVING ENVIRONMENT: Lives with: lives with their family and lives with their spouse Lives in: House/apartment Stairs: No Has following equipment at home: None  OCCUPATION: retired  PLOF: Independent and stopped exercising in the past 6 months due to shoulder and back pain  PATIENT GOALS:have less pain and better motions and function  NEXT MD VISIT:   OBJECTIVE:  Note: Objective measures were completed at Evaluation  unless otherwise noted.  DIAGNOSTIC FINDINGS:  X-rays showed arthritic changes  PATIENT SURVEYS:  FOTO 50  COGNITION: Overall cognitive status: Within functional limits for tasks assessed     SENSATION: WFL  POSTURE: Fwd head, rounded shoulders  UPPER EXTREMITY ROM:   Active ROM AROM Right eval PROM  Right eval  Shoulder flexion 140 160  Shoulder extension    Shoulder abduction 130 150  Shoulder adduction    Shoulder internal rotation 40p! 45p!  Shoulder external rotation 65 75  Elbow flexion    Elbow extension    Wrist flexion    Wrist extension    Wrist ulnar deviation    Wrist radial deviation    Wrist pronation    Wrist supination    (Blank rows = not tested)  UPPER EXTREMITY MMT:  MMT Right eval Left eval  Shoulder flexion 4   Shoulder extension    Shoulder abduction 4-   Shoulder adduction    Shoulder internal rotation 4-   Shoulder external rotation 4-   Middle trapezius    Lower trapezius    Elbow flexion    Elbow extension    Wrist flexion    Wrist extension    Wrist ulnar deviation    Wrist radial deviation    Wrist pronation    Wrist supination    Grip strength (lbs)    (Blank rows = not tested)  SHOULDER SPECIAL TESTS: Impingement tests: Hawkins/Kennedy impingement test: positive  Rotator cuff assessment: Empty can test: negative Biceps assessment: Speed's test: negative  PALPATION:  Mild soreness in the deltoid and the teres area, she is still tender and sore in the low back and the hip  TREATMENT DATE:    PATIENT EDUCATION: Education details: HEP/POC Person educated: Patient Education method: Programmer, Multimedia, Facilities Manager, Actor cues, Verbal cues, and Handouts Education comprehension: verbalized understanding  HOME EXERCISE PROGRAM: Access Code: [email or web address] URL: [email or web address] Date:  09/12/2023 Prepared by: Ozell Mainland  Exercises - Scapular Retraction with Resistance  - 1 x daily - 7 x weekly - 2 sets - 10 reps - 3 hold - Shoulder extension with resistance - Neutral  - 1 x daily - 7 x weekly - 2 sets - 10 reps - 3 hold - Shoulder External Rotation and Scapular Retraction with Resistance  - 1 x daily - 7 x weekly - 2 sets - 10 reps - 3 hold - Standing Shoulder Horizontal Abduction with Resistance  - 1 x daily - 7 x weekly - 2 sets - 10 reps - 3 hold  ASSESSMENT:  CLINICAL IMPRESSION: Patient is a 78 y.o. female who was seen today for physical therapy evaluation and treatment for right shoulder pain that started about 6 months ago, she has had an injection in the past that did not help.  X-rays some some degenerative arthritis.  Has some limitation in ROM and strength.  Biggest issue with this is posture.  She has very rounded and forward head posture which really could affect the shoulder joint.  We have been seeing her for her back and hip, she had recent injection here, we will start treating the shoulder and continue with the back  OBJECTIVE IMPAIRMENTS: Abnormal gait, cardiopulmonary status limiting activity, decreased activity tolerance, decreased balance, decreased mobility, difficulty walking, decreased ROM, decreased strength, increased fascial restrictions, impaired perceived functional ability, increased muscle spasms, impaired flexibility, impaired UE functional use, impaired vision/preception, improper body mechanics, postural dysfunction, and pain.   REHAB POTENTIAL: Good  CLINICAL DECISION MAKING: Stable/uncomplicated  EVALUATION COMPLEXITY: Low   GOALS: Goals reviewed with patient? Yes  SHORT TERM GOALS: Target date: 10/06/23  Independent with initial HEP Baseline: Goal status: INITIAL  LONG TERM GOALS: Target date: 12/11/23  Independent with advanced HEP or gym equipment Goal status: INITIAL  2.  Decrease shoulder pain 50% Baseline: 4/10  worse with activity Goal status: INITIAL  3.  Increase right shoulder ROM to 150 degrees flexion and abduction Baseline:  Goal status: INITIAL  4.  Be able to reach her bra with the right hand Baseline: can reach about 3 from the bra Goal status: INITIAL  5.  Increase strength to 4/5 Baseline: 4- Goal status: INITIAL  6.  Improve shoulder FOTO  score by 10 points Baseline: FOTO 50 Goal status: INITIAL  PLAN:  PT FREQUENCY: 1x/week  PT DURATION: 12 weeks  PLANNED INTERVENTIONS: 97164- PT Re-evaluation, 97110-Therapeutic exercises, 97530- Therapeutic activity, [id number]- Neuromuscular re-education, 97535- Self Care, 02859- Manual therapy, 97035- Ultrasound, 02966- Ionotophoresis 4mg /ml Dexamethasone , Patient/Family education, Taping, Joint mobilization, Cryotherapy, and Moist heat  PLAN FOR NEXT SESSION: posture and stability of the back and the shoulder   Raneisha Bress W, PT 09/12/2023, 8:49 AM

## 2023-09-13 ENCOUNTER — Ambulatory Visit: Payer: BC Managed Care – PPO | Admitting: Physical Therapy

## 2023-09-18 ENCOUNTER — Encounter: Payer: Self-pay | Admitting: Physical Therapy

## 2023-09-18 ENCOUNTER — Ambulatory Visit: Payer: BC Managed Care – PPO | Admitting: Physical Therapy

## 2023-09-18 DIAGNOSIS — M12811 Other specific arthropathies, not elsewhere classified, right shoulder: Secondary | ICD-10-CM | POA: Diagnosis not present

## 2023-09-18 DIAGNOSIS — M25611 Stiffness of right shoulder, not elsewhere classified: Secondary | ICD-10-CM

## 2023-09-18 DIAGNOSIS — M25551 Pain in right hip: Secondary | ICD-10-CM

## 2023-09-18 DIAGNOSIS — G8929 Other chronic pain: Secondary | ICD-10-CM

## 2023-09-18 DIAGNOSIS — M6281 Muscle weakness (generalized): Secondary | ICD-10-CM | POA: Diagnosis not present

## 2023-09-18 DIAGNOSIS — R262 Difficulty in walking, not elsewhere classified: Secondary | ICD-10-CM | POA: Diagnosis not present

## 2023-09-18 DIAGNOSIS — M25511 Pain in right shoulder: Secondary | ICD-10-CM | POA: Diagnosis not present

## 2023-09-18 DIAGNOSIS — R252 Cramp and spasm: Secondary | ICD-10-CM

## 2023-09-18 NOTE — Therapy (Signed)
 OUTPATIENT PHYSICAL THERAPY SHOULDER TREATMNET   Patient Name: Kimberly Cox MRN: [id number] DOB:Dec 12, 1945, 78 y.o., female Today's Date: 09/18/2023  END OF SESSION:  PT End of Session - 09/18/23 0933     Visit Number 2    Number of Visits 7    Date for PT Re-Evaluation 11/04/23    Authorization Type BCBS 1 of 6    PT Start Time 0930    PT Stop Time 1015    PT Time Calculation (min) 45 min    Activity Tolerance Patient tolerated treatment well    Behavior During Therapy [facility] for tasks assessed/performed             Past Medical History:  Diagnosis Date   Anxiety    Anxiety disorder    Concussion Dec 13th, 2011   fell, striking her head against a wall, developed dizziness - UC eval c/w concussion. Had follow-up and did ok.    History of chest pain    Hyperlipidemia    Hypertension    Plantar fasciitis    Spinal stenosis, lumbar    Past Surgical History:  Procedure Laterality Date   ARTHRODESIS METATARSALPHALANGEAL JOINT (MTPJ) Right 10/08/2015   Procedure: RIGHT HALLUX METATARSAL PHALANGEAL JOINT  ARTHRODESIS;  Surgeon: Norleen Armor, MD;  Location: [facility];  Service: Orthopedics;  Laterality: Right;   caesarean section     CESAREAN SECTION     HAMMERTOE RECONSTRUCTION WITH WEIL OSTEOTOMY Right 10/08/2015   Procedure: RIGHT SECOND METATARSAL WEIL HAMMERTOE CORRECTION;  Surgeon: Norleen Armor, MD;  Location: [facility];  Service: Orthopedics;  Laterality: Right;   TONSILLECTOMY     Patient Active Problem List   Diagnosis Date Noted   Diuretic-induced hypokalemia 03/31/2020   Sciatica of right side 05/01/2018   Piriformis syndrome of left side 11/27/2017   GERD with esophagitis 08/22/2017   Asthmatic bronchitis 08/22/2017   Prediabetes 02/02/2017   Pure hyperglyceridemia 02/02/2017   Degenerative cervical disc 01/26/2016   Degenerative lumbar disc 01/26/2016   Spinal stenosis of lumbar region with radiculopathy 01/20/2016   Routine  general medical examination at a health care facility 07/18/2014   Hereditary and idiopathic peripheral neuropathy 01/31/2008   Hyperlipidemia LDL goal <130 12/05/2007   Essential hypertension 12/05/2007    PCP: IVAR Molt, MD  REFERRING PROVIDER: Lonell Sprang, DO  REFERRING DIAG: right shoulder pain  THERAPY DIAG:  Chronic right shoulder pain  Stiffness of right shoulder, not elsewhere classified  Spasm  Muscle weakness (generalized)  Pain in right hip  Difficulty in walking, not elsewhere classified  Rationale for Evaluation and Treatment: Rehabilitation  ONSET DATE: 09/04/23  SUBJECTIVE:  SUBJECTIVE STATEMENT: Still sore  We have been seeing the patient for back and hip issues in the past year, has a new order for right shoulder pain, x-rays show degenerative changes with arthritis of the A/C joint and shoulder.  Reports that she feels like she was doing an exercise machine and did too big of a motion.  This started back in June  Hand dominance: Right  PERTINENT HISTORY: See above  PAIN:  Are you having pain? Yes: NPRS scale: 2 Pain location: right shoulder Pain description: ache Aggravating factors: reaching, sometimes ache with sitting, dressing, doing hair, opening car door, pain up to 7/10 Relieving factors: nothing really helped except not moving  PRECAUTIONS: None  RED FLAGS: None   WEIGHT BEARING RESTRICTIONS: No  FALLS:  Has patient fallen in last 6 months? No  LIVING ENVIRONMENT: Lives with: lives with their family and lives with their spouse Lives in: House/apartment Stairs: No Has following equipment at home: None  OCCUPATION: retired  PLOF: Independent and stopped exercising in the past 6 months due to shoulder and back pain  PATIENT GOALS:have less pain and  better motions and function  NEXT MD VISIT:   OBJECTIVE:  Note: Objective measures were completed at Evaluation unless otherwise noted.  DIAGNOSTIC FINDINGS:  X-rays showed arthritic changes  PATIENT SURVEYS:  FOTO 50  COGNITION: Overall cognitive status: Within functional limits for tasks assessed     SENSATION: WFL  POSTURE: Fwd head, rounded shoulders  UPPER EXTREMITY ROM:   Active ROM AROM Right eval PROM  Right eval  Shoulder flexion 140 160  Shoulder extension    Shoulder abduction 130 150  Shoulder adduction    Shoulder internal rotation 40p! 45p!  Shoulder external rotation 65 75  Elbow flexion    Elbow extension    Wrist flexion    Wrist extension    Wrist ulnar deviation    Wrist radial deviation    Wrist pronation    Wrist supination    (Blank rows = not tested)  UPPER EXTREMITY MMT:  MMT Right eval Left eval  Shoulder flexion 4   Shoulder extension    Shoulder abduction 4-   Shoulder adduction    Shoulder internal rotation 4-   Shoulder external rotation 4-   Middle trapezius    Lower trapezius    Elbow flexion    Elbow extension    Wrist flexion    Wrist extension    Wrist ulnar deviation    Wrist radial deviation    Wrist pronation    Wrist supination    Grip strength (lbs)    (Blank rows = not tested)  SHOULDER SPECIAL TESTS: Impingement tests: Hawkins/Kennedy impingement test: positive  Rotator cuff assessment: Empty can test: negative Biceps assessment: Speed's test: negative  PALPATION:  Mild soreness in the deltoid and the teres area, she is still tender and sore in the low back and the hip  TREATMENT DATE:  09/18/23 UBE level 2 x 4 minutes Seated row 15# 2x10 LAts 15# 2x10 Straight arm pulls 5# cues for core and posture Yellow tband ER/IR Yellow tand small abduction arms at side Feet on ball  K2C, rotation, small bridge and isometric abs Passive stretch LE's and right UE  PATIENT EDUCATION: Education details: HEP/POC Person educated: Patient Education method: Explanation, Demonstration, Tactile cues, Verbal cues, and Handouts Education comprehension: verbalized understanding  HOME EXERCISE PROGRAM: Access Code: [email or web address] URL: [email or web address] Date: 09/12/2023 Prepared by: Ozell Mainland  Exercises - Scapular Retraction with Resistance  - 1 x daily - 7 x weekly - 2 sets - 10 reps - 3 hold - Shoulder extension with resistance - Neutral  - 1 x daily - 7 x weekly - 2 sets - 10 reps - 3 hold - Shoulder External Rotation and Scapular Retraction with Resistance  - 1 x daily - 7 x weekly - 2 sets - 10 reps - 3 hold - Standing Shoulder Horizontal Abduction with Resistance  - 1 x daily - 7 x weekly - 2 sets - 10 reps - 3 hold  ASSESSMENT:  CLINICAL IMPRESSION: Initiated Shoulder ROM and stability with a lot of cues for posture. Some pain in the back with us  holding good posture for longer periods.  Added some passive stretch  Patient is a 78 y.o. female who was seen today for physical therapy evaluation and treatment for right shoulder pain that started about 6 months ago, she has had an injection in the past that did not help.  X-rays some some degenerative arthritis.  Has some limitation in ROM and strength.  Biggest issue with this is posture.  She has very rounded and forward head posture which really could affect the shoulder joint.  We have been seeing her for her back and hip, she had recent injection here, we will start treating the shoulder and continue with the back  OBJECTIVE IMPAIRMENTS: Abnormal gait, cardiopulmonary status limiting activity, decreased activity tolerance, decreased balance, decreased mobility, difficulty walking, decreased ROM, decreased strength, increased fascial restrictions, impaired perceived functional ability, increased muscle  spasms, impaired flexibility, impaired UE functional use, impaired vision/preception, improper body mechanics, postural dysfunction, and pain.   REHAB POTENTIAL: Good  CLINICAL DECISION MAKING: Stable/uncomplicated  EVALUATION COMPLEXITY: Low   GOALS: Goals reviewed with patient? Yes  SHORT TERM GOALS: Target date: 10/06/23  Independent with initial HEP Baseline: Goal status: progressing 09/18/23  LONG TERM GOALS: Target date: 12/11/23  Independent with advanced HEP or gym equipment Goal status: INITIAL  2.  Decrease shoulder pain 50% Baseline: 4/10 worse with activity Goal status: INITIAL  3.  Increase right shoulder ROM to 150 degrees flexion and abduction Baseline:  Goal status: INITIAL  4.  Be able to reach her bra with the right hand Baseline: can reach about 3 from the bra Goal status: INITIAL  5.  Increase strength to 4/5 Baseline: 4- Goal status: INITIAL  6.  Improve shoulder FOTO  score by 10 points Baseline: FOTO 50 Goal status: INITIAL  PLAN:  PT FREQUENCY: 1x/week  PT DURATION: 12 weeks  PLANNED INTERVENTIONS: 97164- PT Re-evaluation, 97110-Therapeutic exercises, 97530- Therapeutic activity, [id number]- Neuromuscular re-education, 97535- Self Care, 02859- Manual therapy, 97035- Ultrasound, 02966- Ionotophoresis 4mg /ml Dexamethasone , Patient/Family education, Taping, Joint mobilization, Cryotherapy, and Moist heat  PLAN FOR NEXT SESSION: posture and stability of the back and the shoulder   Monaye Blackie W, PT 09/18/2023, 9:34 AM

## 2023-09-20 DIAGNOSIS — H26492 Other secondary cataract, left eye: Secondary | ICD-10-CM | POA: Diagnosis not present

## 2023-09-28 ENCOUNTER — Ambulatory Visit: Payer: BC Managed Care – PPO | Admitting: Physical Therapy

## 2023-09-28 ENCOUNTER — Encounter: Payer: Self-pay | Admitting: Physical Therapy

## 2023-09-28 DIAGNOSIS — M25511 Pain in right shoulder: Secondary | ICD-10-CM | POA: Diagnosis not present

## 2023-09-28 DIAGNOSIS — M6281 Muscle weakness (generalized): Secondary | ICD-10-CM | POA: Diagnosis not present

## 2023-09-28 DIAGNOSIS — M25551 Pain in right hip: Secondary | ICD-10-CM | POA: Diagnosis not present

## 2023-09-28 DIAGNOSIS — M25611 Stiffness of right shoulder, not elsewhere classified: Secondary | ICD-10-CM

## 2023-09-28 DIAGNOSIS — R252 Cramp and spasm: Secondary | ICD-10-CM | POA: Diagnosis not present

## 2023-09-28 DIAGNOSIS — M12811 Other specific arthropathies, not elsewhere classified, right shoulder: Secondary | ICD-10-CM | POA: Diagnosis not present

## 2023-09-28 DIAGNOSIS — R262 Difficulty in walking, not elsewhere classified: Secondary | ICD-10-CM | POA: Diagnosis not present

## 2023-09-28 DIAGNOSIS — G8929 Other chronic pain: Secondary | ICD-10-CM

## 2023-09-28 NOTE — Therapy (Signed)
OUTPATIENT PHYSICAL THERAPY SHOULDER TREATMNET   Patient Name: NACHELLE APPLEBAUM MRN: [id number] DOB:06-Nov-1945, 78 y.o., female Today's Date: 09/28/2023  END OF SESSION:  PT End of Session - 09/28/23 0850     Visit Number 3    Number of Visits 7    Date for PT Re-Evaluation 11/04/23    Authorization Type BCBS 2 of 6    PT Start Time 757 100 0020    PT Stop Time 0933    PT Time Calculation (min) 46 min    Activity Tolerance Patient tolerated treatment well    Behavior During Therapy [facility] for tasks assessed/performed             Past Medical History:  Diagnosis Date   Anxiety    Anxiety disorder    Concussion Dec 13th, 2011   fell, striking her head against a wall, developed dizziness - UC eval c/w concussion. Had follow-up and did ok.    History of chest pain    Hyperlipidemia    Hypertension    Plantar fasciitis    Spinal stenosis, lumbar    Past Surgical History:  Procedure Laterality Date   ARTHRODESIS METATARSALPHALANGEAL JOINT (MTPJ) Right 10/08/2015   Procedure: RIGHT HALLUX METATARSAL PHALANGEAL JOINT  ARTHRODESIS;  Surgeon: Toni Arthurs, MD;  Location: [facility];  Service: Orthopedics;  Laterality: Right;   caesarean section     CESAREAN SECTION     HAMMERTOE RECONSTRUCTION WITH WEIL OSTEOTOMY Right 10/08/2015   Procedure: RIGHT SECOND METATARSAL WEIL HAMMERTOE CORRECTION;  Surgeon: Toni Arthurs, MD;  Location: [facility];  Service: Orthopedics;  Laterality: Right;   TONSILLECTOMY     Patient Active Problem List   Diagnosis Date Noted   Diuretic-induced hypokalemia 03/31/2020   Sciatica of right side 05/01/2018   Piriformis syndrome of left side 11/27/2017   GERD with esophagitis 08/22/2017   Asthmatic bronchitis 08/22/2017   Prediabetes 02/02/2017   Pure hyperglyceridemia 02/02/2017   Degenerative cervical disc 01/26/2016   Degenerative lumbar disc 01/26/2016   Spinal stenosis of lumbar region with radiculopathy 01/20/2016   Routine  general medical examination at a health care facility 07/18/2014   Hereditary and idiopathic peripheral neuropathy 01/31/2008   Hyperlipidemia LDL goal <130 12/05/2007   Essential hypertension 12/05/2007    PCP: Leitha Schuller, MD  REFERRING PROVIDER: Madelyn Brunner, DO  REFERRING DIAG: right shoulder pain  THERAPY DIAG:  Chronic right shoulder pain  Stiffness of right shoulder, not elsewhere classified  Spasm  Muscle weakness (generalized)  Rationale for Evaluation and Treatment: Rehabilitation  ONSET DATE: 09/04/23  SUBJECTIVE:  SUBJECTIVE STATEMENT: Patient went to yoga, had some difficulty with her back, we discussed some options especially talking with the teacher prior to class about her issues, c/o tightness in the shoulder  We have been seeing the patient for back and hip issues in the past year, has a new order for right shoulder pain, x-rays show degenerative changes with arthritis of the A/C joint and shoulder.  Reports that she feels like she was doing an exercise machine and did too big of a motion.  This started back in June  Hand dominance: Right  PERTINENT HISTORY: See above  PAIN:  Are you having pain? Yes: NPRS scale: 2/10 Pain location: right shoulder Pain description: ache Aggravating factors: reaching, sometimes ache with sitting, dressing, doing hair, opening car door, pain up to 7/10 Relieving factors: nothing really helped except not moving  PRECAUTIONS: None  RED FLAGS: None   WEIGHT BEARING RESTRICTIONS: No  FALLS:  Has patient fallen in last 6 months? No  LIVING ENVIRONMENT: Lives with: lives with their family and lives with their spouse Lives in: House/apartment Stairs: No Has following equipment at home: None  OCCUPATION: retired  PLOF: Independent and  stopped exercising in the past 6 months due to shoulder and back pain  PATIENT GOALS:have less pain and better motions and function  NEXT MD VISIT:   OBJECTIVE:  Note: Objective measures were completed at Evaluation unless otherwise noted.  DIAGNOSTIC FINDINGS:  X-rays showed arthritic changes  PATIENT SURVEYS:  FOTO 50  COGNITION: Overall cognitive status: Within functional limits for tasks assessed     SENSATION: WFL  POSTURE: Fwd head, rounded shoulders  UPPER EXTREMITY ROM:   Active ROM AROM Right eval PROM  Right eval  Shoulder flexion 140 160  Shoulder extension    Shoulder abduction 130 150  Shoulder adduction    Shoulder internal rotation 40p! 45p!  Shoulder external rotation 65 75  Elbow flexion    Elbow extension    Wrist flexion    Wrist extension    Wrist ulnar deviation    Wrist radial deviation    Wrist pronation    Wrist supination    (Blank rows = not tested)  UPPER EXTREMITY MMT:  MMT Right eval Left eval  Shoulder flexion 4   Shoulder extension    Shoulder abduction 4-   Shoulder adduction    Shoulder internal rotation 4-   Shoulder external rotation 4-   Middle trapezius    Lower trapezius    Elbow flexion    Elbow extension    Wrist flexion    Wrist extension    Wrist ulnar deviation    Wrist radial deviation    Wrist pronation    Wrist supination    Grip strength (lbs)    (Blank rows = not tested)  SHOULDER SPECIAL TESTS: Impingement tests: Hawkins/Kennedy impingement test: positive  Rotator cuff assessment: Empty can test: negative Biceps assessment: Speed's test: negative  PALPATION:  Mild soreness in the deltoid and the teres area, she is still tender and sore in the low back and the hip  TREATMENT DATE:  09/28/23 UBE level 3 x 4 minutes Seated row 15# Lats 15# 15# triceps Red tband  ER Red tband IR Went over HEP and corrected a few things for posture and where to set up the band in the doorframe Red tband horizonatal abd Small bridges PAssive stretch all motions right shoulder and LE's  09/18/23 UBE level 2 x 4 minutes Seated row 15# 2x10 LAts 15# 2x10 Straight arm pulls 5# cues for core and posture Yellow tband ER/IR Yellow tand small abduction arms at side Feet on ball K2C, rotation, small bridge and isometric abs Passive stretch LE's and right UE  PATIENT EDUCATION: Education details: HEP/POC Person educated: Patient Education method: Explanation, Demonstration, Tactile cues, Verbal cues, and Handouts Education comprehension: verbalized understanding  HOME EXERCISE PROGRAM: Access Code: [email or web address] URL: [email or web address] Date: 09/12/2023 Prepared by: Stacie Glaze  Exercises - Scapular Retraction with Resistance  - 1 x daily - 7 x weekly - 2 sets - 10 reps - 3 hold - Shoulder extension with resistance - Neutral  - 1 x daily - 7 x weekly - 2 sets - 10 reps - 3 hold - Shoulder External Rotation and Scapular Retraction with Resistance  - 1 x daily - 7 x weekly - 2 sets - 10 reps - 3 hold - Standing Shoulder Horizontal Abduction with Resistance  - 1 x daily - 7 x weekly - 2 sets - 10 reps - 3 hold  ASSESSMENT:  CLINICAL IMPRESSION: Continue to work on shoulder stability and posture, she had some issues with the HEP so we went over it and corrected a few things to make it batter for her at home, she does report holding good posture does hurt the low back  Patient is a 78 y.o. female who was seen today for physical therapy evaluation and treatment for right shoulder pain that started about 6 months ago, she has had an injection in the past that did not help.  X-rays some some degenerative arthritis.  Has some limitation in ROM and strength.  Biggest issue with this is posture.  She has very rounded and forward head posture which really could  affect the shoulder joint.  We have been seeing her for her back and hip, she had recent injection here, we will start treating the shoulder and continue with the back  OBJECTIVE IMPAIRMENTS: Abnormal gait, cardiopulmonary status limiting activity, decreased activity tolerance, decreased balance, decreased mobility, difficulty walking, decreased ROM, decreased strength, increased fascial restrictions, impaired perceived functional ability, increased muscle spasms, impaired flexibility, impaired UE functional use, impaired vision/preception, improper body mechanics, postural dysfunction, and pain.   REHAB POTENTIAL: Good  CLINICAL DECISION MAKING: Stable/uncomplicated  EVALUATION COMPLEXITY: Low   GOALS: Goals reviewed with patient? Yes  SHORT TERM GOALS: Target date: 10/06/23  Independent with initial HEP Baseline: Goal status: progressing 09/18/23  LONG TERM GOALS: Target date: 12/11/23  Independent with advanced HEP or gym equipment Goal status: INITIAL  2.  Decrease shoulder pain 50% Baseline: 4/10 worse with activity Goal status: progressing 09/28/23  3.  Increase right shoulder ROM to 150 degrees flexion and abduction Baseline:  Goal status: INITIAL  4.  Be able to reach her bra with the right hand Baseline: can reach about 3" from the bra Goal status: progressing 09/28/23  5.  Increase strength to 4/5 Baseline: 4- Goal status: INITIAL  6.  Improve shoulder FOTO  score by 10 points Baseline: FOTO 50 Goal status: INITIAL  PLAN:  PT FREQUENCY: 1x/week  PT DURATION: 12 weeks  PLANNED INTERVENTIONS: 97164- PT Re-evaluation, 97110-Therapeutic exercises, 97530- Therapeutic activity, [id number]- Neuromuscular re-education, 97535- Self Care, 16109- Manual therapy, 97035- Ultrasound, 60454- Ionotophoresis 4mg /ml Dexamethasone, Patient/Family education, Taping, Joint mobilization, Cryotherapy, and Moist heat  PLAN FOR NEXT SESSION: posture and stability of the back and the  shoulder   Jeramiah Mccaughey W, PT 09/28/2023, 8:57 AM

## 2023-10-05 ENCOUNTER — Ambulatory Visit: Payer: BC Managed Care – PPO | Admitting: Physical Therapy

## 2023-10-05 ENCOUNTER — Encounter: Payer: Self-pay | Admitting: Physical Therapy

## 2023-10-05 ENCOUNTER — Ambulatory Visit: Payer: BC Managed Care – PPO | Admitting: Sports Medicine

## 2023-10-05 DIAGNOSIS — G8929 Other chronic pain: Secondary | ICD-10-CM

## 2023-10-05 DIAGNOSIS — R252 Cramp and spasm: Secondary | ICD-10-CM

## 2023-10-05 DIAGNOSIS — M25551 Pain in right hip: Secondary | ICD-10-CM | POA: Diagnosis not present

## 2023-10-05 DIAGNOSIS — M12811 Other specific arthropathies, not elsewhere classified, right shoulder: Secondary | ICD-10-CM | POA: Diagnosis not present

## 2023-10-05 DIAGNOSIS — M25611 Stiffness of right shoulder, not elsewhere classified: Secondary | ICD-10-CM | POA: Diagnosis not present

## 2023-10-05 DIAGNOSIS — R262 Difficulty in walking, not elsewhere classified: Secondary | ICD-10-CM

## 2023-10-05 DIAGNOSIS — M6281 Muscle weakness (generalized): Secondary | ICD-10-CM

## 2023-10-05 DIAGNOSIS — M25511 Pain in right shoulder: Secondary | ICD-10-CM | POA: Diagnosis not present

## 2023-10-05 NOTE — Therapy (Signed)
OUTPATIENT PHYSICAL THERAPY SHOULDER TREATMNET   Patient Name: Kimberly Cox MRN: [id number] DOB:11/19/1945, 78 y.o., female Today's Date: 10/05/2023  END OF SESSION:  PT End of Session - 10/05/23 1357     Visit Number 4    Number of Visits 7    Date for PT Re-Evaluation 11/04/23    Authorization Type BCBS 3 of 6    PT Start Time 1357    PT Stop Time 1443    PT Time Calculation (min) 46 min    Activity Tolerance Patient tolerated treatment well    Behavior During Therapy [facility] for tasks assessed/performed             Past Medical History:  Diagnosis Date   Anxiety    Anxiety disorder    Concussion Dec 13th, 2011   fell, striking her head against a wall, developed dizziness - UC eval c/w concussion. Had follow-up and did ok.    History of chest pain    Hyperlipidemia    Hypertension    Plantar fasciitis    Spinal stenosis, lumbar    Past Surgical History:  Procedure Laterality Date   ARTHRODESIS METATARSALPHALANGEAL JOINT (MTPJ) Right 10/08/2015   Procedure: RIGHT HALLUX METATARSAL PHALANGEAL JOINT  ARTHRODESIS;  Surgeon: Toni Arthurs, MD;  Location: [facility];  Service: Orthopedics;  Laterality: Right;   caesarean section     CESAREAN SECTION     HAMMERTOE RECONSTRUCTION WITH WEIL OSTEOTOMY Right 10/08/2015   Procedure: RIGHT SECOND METATARSAL WEIL HAMMERTOE CORRECTION;  Surgeon: Toni Arthurs, MD;  Location: [facility];  Service: Orthopedics;  Laterality: Right;   TONSILLECTOMY     Patient Active Problem List   Diagnosis Date Noted   Diuretic-induced hypokalemia 03/31/2020   Sciatica of right side 05/01/2018   Piriformis syndrome of left side 11/27/2017   GERD with esophagitis 08/22/2017   Asthmatic bronchitis 08/22/2017   Prediabetes 02/02/2017   Pure hyperglyceridemia 02/02/2017   Degenerative cervical disc 01/26/2016   Degenerative lumbar disc 01/26/2016   Spinal stenosis of lumbar region with radiculopathy 01/20/2016   Routine  general medical examination at a health care facility 07/18/2014   Hereditary and idiopathic peripheral neuropathy 01/31/2008   Hyperlipidemia LDL goal <130 12/05/2007   Essential hypertension 12/05/2007    PCP: Leitha Schuller, MD  REFERRING PROVIDER: Madelyn Brunner, DO  REFERRING DIAG: right shoulder pain  THERAPY DIAG:  Chronic right shoulder pain  Stiffness of right shoulder, not elsewhere classified  Spasm  Muscle weakness (generalized)  Pain in right hip  Difficulty in walking, not elsewhere classified  Rationale for Evaluation and Treatment: Rehabilitation  ONSET DATE: 09/04/23  SUBJECTIVE:  SUBJECTIVE STATEMENT: Patient comes in with right shoulder and scapular pain increased. We have been seeing the patient for back and hip issues in the past year, has a new order for right shoulder pain, x-rays show degenerative changes with arthritis of the A/C joint and shoulder.  She reports feeling very tight  Hand dominance: Right  PERTINENT HISTORY: See above  PAIN:  Are you having pain? Yes: NPRS scale: 5/10 Pain location: right shoulder Pain description: ache Aggravating factors: reaching, sometimes ache with sitting, dressing, doing hair, opening car door, pain up to 7/10 Relieving factors: nothing really helped except not moving  PRECAUTIONS: None  RED FLAGS: None   WEIGHT BEARING RESTRICTIONS: No  FALLS:  Has patient fallen in last 6 months? No  LIVING ENVIRONMENT: Lives with: lives with their family and lives with their spouse Lives in: House/apartment Stairs: No Has following equipment at home: None  OCCUPATION: retired  PLOF: Independent and stopped exercising in the past 6 months due to shoulder and back pain  PATIENT GOALS:have less pain and better motions and  function  NEXT MD VISIT:   OBJECTIVE:  Note: Objective measures were completed at Evaluation unless otherwise noted.  DIAGNOSTIC FINDINGS:  X-rays showed arthritic changes  PATIENT SURVEYS:  FOTO 50  COGNITION: Overall cognitive status: Within functional limits for tasks assessed     SENSATION: WFL  POSTURE: Fwd head, rounded shoulders  UPPER EXTREMITY ROM:   Active ROM AROM Right eval PROM  Right eval  Shoulder flexion 140 160  Shoulder extension    Shoulder abduction 130 150  Shoulder adduction    Shoulder internal rotation 40p! 45p!  Shoulder external rotation 65 75  Elbow flexion    Elbow extension    Wrist flexion    Wrist extension    Wrist ulnar deviation    Wrist radial deviation    Wrist pronation    Wrist supination    (Blank rows = not tested)  UPPER EXTREMITY MMT:  MMT Right eval Left eval  Shoulder flexion 4   Shoulder extension    Shoulder abduction 4-   Shoulder adduction    Shoulder internal rotation 4-   Shoulder external rotation 4-   Middle trapezius    Lower trapezius    Elbow flexion    Elbow extension    Wrist flexion    Wrist extension    Wrist ulnar deviation    Wrist radial deviation    Wrist pronation    Wrist supination    Grip strength (lbs)    (Blank rows = not tested)  SHOULDER SPECIAL TESTS: Impingement tests: Hawkins/Kennedy impingement test: positive  Rotator cuff assessment: Empty can test: negative Biceps assessment: Speed's test: negative  PALPATION:  Mild soreness in the deltoid and the teres area, she is still tender and sore in the low back and the hip  TREATMENT DATE:  10/05/23 Nustep level 5 x 5 minutes Passive stretch LE's and right shoulder, gentle cervical stretches Seated pball tollouts Shrugs, scapular retraction STM to the right upper trap, rhomboid and into the  cervical area  09/28/23 UBE level 3 x 4 minutes Seated row 15# Lats 15# 15# triceps Red tband ER Red tband IR Went over HEP and corrected a few things for posture and where to set up the band in the doorframe Red tband horizonatal abd Small bridges PAssive stretch all motions right shoulder and LE's  09/18/23 UBE level 2 x 4 minutes Seated row 15# 2x10 LAts 15# 2x10 Straight arm pulls 5# cues for core and posture Yellow tband ER/IR Yellow tand small abduction arms at side Feet on ball K2C, rotation, small bridge and isometric abs Passive stretch LE's and right UE  PATIENT EDUCATION: Education details: HEP/POC Person educated: Patient Education method: Explanation, Demonstration, Tactile cues, Verbal cues, and Handouts Education comprehension: verbalized understanding  HOME EXERCISE PROGRAM: Access Code: [email or web address] URL: [email or web address] Date: 09/12/2023 Prepared by: Stacie Glaze  Exercises - Scapular Retraction with Resistance  - 1 x daily - 7 x weekly - 2 sets - 10 reps - 3 hold - Shoulder extension with resistance - Neutral  - 1 x daily - 7 x weekly - 2 sets - 10 reps - 3 hold - Shoulder External Rotation and Scapular Retraction with Resistance  - 1 x daily - 7 x weekly - 2 sets - 10 reps - 3 hold - Standing Shoulder Horizontal Abduction with Resistance  - 1 x daily - 7 x weekly - 2 sets - 10 reps - 3 hold  ASSESSMENT:  CLINICAL IMPRESSION: Patient comes in to day with increased pain in the shoulder and scapular area, we worked more on this today, has some significant knots and tightness in this area, tolerated STM.  Did some easier exercises with her today to try to loosen this up  Patient is a 78 y.o. female who was seen today for physical therapy evaluation and treatment for right shoulder pain that started about 6 months ago, she has had an injection in the past that did not help.  X-rays some some degenerative arthritis.  Has some limitation in  ROM and strength.  Biggest issue with this is posture.  She has very rounded and forward head posture which really could affect the shoulder joint.  We have been seeing her for her back and hip, she had recent injection here, we will start treating the shoulder and continue with the back  OBJECTIVE IMPAIRMENTS: Abnormal gait, cardiopulmonary status limiting activity, decreased activity tolerance, decreased balance, decreased mobility, difficulty walking, decreased ROM, decreased strength, increased fascial restrictions, impaired perceived functional ability, increased muscle spasms, impaired flexibility, impaired UE functional use, impaired vision/preception, improper body mechanics, postural dysfunction, and pain.   REHAB POTENTIAL: Good  CLINICAL DECISION MAKING: Stable/uncomplicated  EVALUATION COMPLEXITY: Low   GOALS: Goals reviewed with patient? Yes  SHORT TERM GOALS: Target date: 10/06/23  Independent with initial HEP Baseline: Goal status: met 10/05/23  LONG TERM GOALS: Target date: 12/11/23  Independent with advanced HEP or gym equipment Goal status: INITIAL  2.  Decrease shoulder pain 50% Baseline: 4/10 worse with activity Goal status: progressing 09/28/23  3.  Increase right shoulder ROM to 150 degrees flexion and abduction Baseline:  Goal status: ongoing 10/04/33  4.  Be able to reach her bra with the right hand Baseline: can reach about 3" from the bra Goal status: progressing  09/28/23  5.  Increase strength to 4/5 Baseline: 4- Goal status: ongoing 10/05/23  6.  Improve shoulder FOTO  score by 10 points Baseline: FOTO 50 Goal status: INITIAL  PLAN:  PT FREQUENCY: 1x/week  PT DURATION: 12 weeks  PLANNED INTERVENTIONS: 97164- PT Re-evaluation, 97110-Therapeutic exercises, 97530- Therapeutic activity, [id number]- Neuromuscular re-education, 97535- Self Care, 98119- Manual therapy, 97035- Ultrasound, 14782- Ionotophoresis 4mg /ml Dexamethasone, Patient/Family education,  Taping, Joint mobilization, Cryotherapy, and Moist heat  PLAN FOR NEXT SESSION: posture and stability of the back and the shoulder   Lucion Dilger W, PT 10/05/2023, 2:00 PM

## 2023-10-11 ENCOUNTER — Ambulatory Visit: Payer: BC Managed Care – PPO | Admitting: Physical Therapy

## 2023-10-11 ENCOUNTER — Encounter (HOSPITAL_BASED_OUTPATIENT_CLINIC_OR_DEPARTMENT_OTHER): Payer: Self-pay

## 2023-10-11 ENCOUNTER — Emergency Department (HOSPITAL_BASED_OUTPATIENT_CLINIC_OR_DEPARTMENT_OTHER)
Admission: EM | Admit: 2023-10-11 | Discharge: 2023-10-11 | Disposition: A | Discharge status: Home / Self Care | Payer: BC Managed Care – PPO | Attending: Emergency Medicine | Admitting: Emergency Medicine

## 2023-10-11 ENCOUNTER — Other Ambulatory Visit: Payer: Self-pay

## 2023-10-11 DIAGNOSIS — G8929 Other chronic pain: Secondary | ICD-10-CM | POA: Diagnosis not present

## 2023-10-11 DIAGNOSIS — M25511 Pain in right shoulder: Secondary | ICD-10-CM | POA: Insufficient documentation

## 2023-10-11 DIAGNOSIS — J45909 Unspecified asthma, uncomplicated: Secondary | ICD-10-CM | POA: Insufficient documentation

## 2023-10-11 DIAGNOSIS — E1140 Type 2 diabetes mellitus with diabetic neuropathy, unspecified: Secondary | ICD-10-CM | POA: Insufficient documentation

## 2023-10-11 DIAGNOSIS — Z79899 Other long term (current) drug therapy: Secondary | ICD-10-CM | POA: Diagnosis not present

## 2023-10-11 DIAGNOSIS — I10 Essential (primary) hypertension: Secondary | ICD-10-CM | POA: Diagnosis not present

## 2023-10-11 MED ORDER — METHOCARBAMOL 500 MG PO TABS: 500.0000 mg | ORAL_TABLET | Freq: Two times a day (BID) | ORAL | 0 refills | Status: DC

## 2023-10-11 NOTE — ED Notes (Signed)
 Pt standing in all yelling at RT staff. Does not listen to this RN trying to review DC papers. Angry about waiting with chest pain. Explained the work-up and apologize for wait. Leaves to lobby.

## 2023-10-11 NOTE — Discharge Instructions (Signed)
 You were seen today for right shoulder pain.  I prescribed for you Robaxin  to take for as a muscle relaxer.  This can cause drowsiness as well as other side effects.  Do not operate a vehicle or swim when taking this medication.  Please take Robaxin , 500 mg up to twice a day as needed for muscle spasm, this is a muscle relaxer, it may cause generalized weakness, sleepiness and you should not drive or do important things while taking this medication.  Recommend that you follow-up with Ortho as well as follow-up with PCP for possible cardiac workup as you have not had any in the past.  You may benefit from undergoing a stress test due to risk factors and age.  No suspicion for any cardiac issues at this time.    However if you begin to develop any chest pain, shortness of breath, lightheadedness, increased fatigue, abdominal pain, return to the ED for further evaluation immediately.

## 2023-10-11 NOTE — ED Triage Notes (Signed)
 In for eval of intermittent right shoulder pain radiating down right arm and upper back onset 1.5 weeks ago.taking Celebrex  x1 month for chronic back and hip pain. Denies SOB, nausea, vomiting, sweating.

## 2023-10-11 NOTE — ED Provider Notes (Signed)
 [place] EMERGENCY DEPARTMENT AT [facility] Provider Note   CSN: [id number] Arrival date & time: 10/11/23  1057     History  Chief Complaint  Patient presents with   Shoulder Pain    Kimberly Cox is a 78 y.o. female.   Shoulder Pain Patient presents with a 1-1/2-week history of right shoulder pain with intermittent numbness and tingling that is worse with certain movements.  Patient states that she also has mild scapular pain that is tender to palpation.  Currently undergoing PT for right arm with last visit being 10/05/2023.  Denies fever, cough, congestion, shortness of breath, chest pain, abdominal pain, fatigue, nausea vomiting diarrhea,     Home Medications Prior to Admission medications   Medication Sig Start Date End Date Taking? Authorizing Provider  methocarbamol  (ROBAXIN ) 500 MG tablet Take 1 tablet (500 mg total) by mouth 2 (two) times daily. 10/11/23  Yes Jacorey Donaway S, PA-C  celecoxib  (CELEBREX ) 100 MG capsule TAKE 1 CAPSULE(100 MG) BY MOUTH TWICE DAILY BETWEEN MEALS AS NEEDED 09/08/23   Brooks, Dana, DO  clobetasol (TEMOVATE) 0.05 % external solution Apply topically daily. 09/18/22   [provider]  co-enzyme Q-10 30 MG capsule Take 30 mg by mouth 3 (three) times daily.    [provider]  irbesartan-hydrochlorothiazide (AVALIDE) 150-12.5 MG tablet Take 1 tablet by mouth daily. 12/18/19 02/08/23  [provider]  omeprazole  (PRILOSEC) 40 MG capsule TAKE 1 CAPSULE(40 MG) BY MOUTH DAILY 11/25/20   Joshua Debby LITTIE, MD  rosuvastatin (CRESTOR) 5 MG tablet Take 5 mg by mouth daily.  01/17/17   [provider]      Allergies    Omnicef  [cefdinir ]    Review of Systems   Review of Systems  Musculoskeletal:  Positive for myalgias.  All other systems reviewed and are negative.   Physical Exam Updated Vital Signs BP (!) 154/71   Pulse 74   Temp 97.8 F (36.6 C)   Resp 16   Ht 5' 5 (1.651 m)   Wt 68 kg   SpO2 100%    BMI 24.96 kg/m  Physical Exam Vitals and nursing note reviewed.  Constitutional:      General: She is not in acute distress.    Appearance: Normal appearance. She is not ill-appearing.  HENT:     Head: Normocephalic and atraumatic.  Eyes:     Extraocular Movements: Extraocular movements intact.     Conjunctiva/sclera: Conjunctivae normal.  Cardiovascular:     Rate and Rhythm: Normal rate and regular rhythm.     Pulses: Normal pulses.     Heart sounds: Normal heart sounds. No murmur heard.    No friction rub. No gallop.  Pulmonary:     Effort: Pulmonary effort is normal. No respiratory distress.     Breath sounds: Normal breath sounds.  Abdominal:     General: Abdomen is flat.     Palpations: Abdomen is soft.     Tenderness: There is no abdominal tenderness.  Musculoskeletal:        General: Tenderness (Reproducible tenderness noted on right rhomboid muscles and right deltoid muscle.) present. No swelling or signs of injury.  Skin:    General: Skin is warm and dry.  Neurological:     General: No focal deficit present.     Mental Status: She is alert and oriented to person, place, and time. Mental status is at baseline.     Sensory: No sensory deficit.     Motor: No  weakness.  Psychiatric:        Mood and Affect: Mood normal.     ED Results / Procedures / Treatments   Labs (all labs ordered are listed, but only abnormal results are displayed) Labs Reviewed - No data to display  EKG EKG Interpretation Date/Time:  Wednesday October 11 2023 12:23:56 EST Ventricular Rate:  77 PR Interval:  160 QRS Duration:  90 QT Interval:  384 QTC Calculation: 434 R Axis:   -82  Text Interpretation: Normal sinus rhythm RSR' or QR pattern in V1 suggests right ventricular conduction delay Left anterior fascicular block Nonspecific ST abnormality Abnormal ECG No previous ECGs available Confirmed by Cottie Cough [phone]) on 10/11/2023 3:25:56 PM  Radiology No results  found.  Procedures Procedures    Medications Ordered in ED Medications - No data to display  ED Course/ Medical Decision Making/ A&P                                 Medical Decision Making  This patient is a 78 year old female who presents to the ED for concern of intermittent right shoulder pain radiating down right arm and upper back x 1.5 weeks.  Currently taking Celebrex  x 1 month for chronic back and hip pain.   Differential diagnoses prior to evaluation: The emergent differential diagnosis includes, but is not limited to, muscle strain, fracture, ligamentous injury, neuropathy, subclavian steal syndrome. This is not an exhaustive differential.   Past Medical History / Co-morbidities / Social History: Hyperlipidemia, peripheral neuropathy, HTN, degenerative disc disease, diabetes, GERD, asthmatic bronchitis, sciatica  Additional history: Chart reviewed. Pertinent results include:  Currently undergoing PT for right shoulder pain on Celebrex , last PT visit was 10/05/2023.   Lab Tests/Imaging studies: No labs or imaging were necessary for this visit as patient is wishing to see Ortho for this problem  Cardiac monitoring: EKG obtained and interpreted by myself and attending physician which shows: NSR   Medications: I ordered medication including outpatient Robaxin .  I have reviewed the patients home medicines and have made adjustments as needed.  ED Course:   Patient is a 78 year old female presents to the ED with a 1-1/2-week history of right shoulder pain.  Patient states that she is currently going to PT for the shoulder however has been worried that it might be cardiac related.  Denies any shortness of breath, fatigue, chest pain, lower extremity pain, lower extremity swelling, abdominal pain.  Low suspicion for ACS, PE, pneumonia.  Physical exam was remarkable for shoulder tenderness to palpation noted over the rhomboid muscles of the right scapula and right deltoid  muscles.  Due to patient's reassuring ECG and exam, I do not believe this condition is cardiac related and do not believe that any emergent pathology is present at this time.  Recommend that she follow-up with her Ortho for further evaluation.  Provided Robaxin  today per patient request for muscle soreness.  Expressed that she should not operate any vehicles and this may cause side effects including drowsiness and to be aware not to drive when using this medication.  Patient vitals remained stable for the course of her time here.  Consider doing further lab workup for further cardiac workup however I do not believe it is indicated at this time.  Recommend that she follow-up with the PCP and consider doing a stress test through cardiac referral.  Patient expressed agreement worsening of plans.  Patient provided strict return  to ER precautions.   Disposition: After consideration of the diagnostic results and the patients response to treatment, I feel that the patient benefit of discharge treatment as above.   emergency department workup does not suggest an emergent condition requiring admission or immediate intervention beyond what has been performed at this time. The plan is: Robaxin , follow-up with Ortho. Return for any new or worsening symptoms. The patient is safe for discharge and has been instructed to return immediately for worsening symptoms, change in symptoms or any other concerns.   Final Clinical Impression(s) / ED Diagnoses Final diagnoses:  Chronic right shoulder pain    Rx / DC Orders ED Discharge Orders          Ordered    methocarbamol  (ROBAXIN ) 500 MG tablet  2 times daily        10/11/23 1544              Beola Derry [place], [place] 10/11/23 1611    Cottie Donnice PARAS, MD 10/12/23 [phone]

## 2023-10-18 ENCOUNTER — Ambulatory Visit: Payer: BC Managed Care – PPO | Admitting: Physical Therapy

## 2023-10-25 ENCOUNTER — Ambulatory Visit: Payer: BC Managed Care – PPO | Admitting: Physical Therapy

## 2023-11-01 DIAGNOSIS — M4802 Spinal stenosis, cervical region: Secondary | ICD-10-CM | POA: Diagnosis not present

## 2023-11-01 DIAGNOSIS — M5412 Radiculopathy, cervical region: Secondary | ICD-10-CM | POA: Diagnosis not present

## 2023-11-02 ENCOUNTER — Other Ambulatory Visit: Payer: Self-pay | Admitting: Nurse Practitioner

## 2023-11-02 DIAGNOSIS — M4802 Spinal stenosis, cervical region: Secondary | ICD-10-CM

## 2023-11-05 ENCOUNTER — Other Ambulatory Visit: Payer: Self-pay | Admitting: Sports Medicine

## 2023-11-20 ENCOUNTER — Ambulatory Visit
Admission: RE | Admit: 2023-11-20 | Discharge: 2023-11-20 | Disposition: A | Discharge status: Home / Self Care | Payer: BC Managed Care – PPO | Source: Ambulatory Visit | Attending: Nurse Practitioner | Admitting: Nurse Practitioner

## 2023-11-20 DIAGNOSIS — M5412 Radiculopathy, cervical region: Secondary | ICD-10-CM | POA: Diagnosis not present

## 2023-11-20 DIAGNOSIS — M4802 Spinal stenosis, cervical region: Secondary | ICD-10-CM

## 2023-11-30 ENCOUNTER — Ambulatory Visit
Admission: EM | Admit: 2023-11-30 | Discharge: 2023-11-30 | Disposition: A | Discharge status: Home / Self Care | Attending: Family Medicine | Admitting: Family Medicine

## 2023-11-30 DIAGNOSIS — J209 Acute bronchitis, unspecified: Secondary | ICD-10-CM

## 2023-11-30 DIAGNOSIS — R051 Acute cough: Secondary | ICD-10-CM | POA: Diagnosis not present

## 2023-11-30 LAB — POC COVID19/FLU A&B COMBO
Covid Antigen, POC: NEGATIVE
Influenza A Antigen, POC: NEGATIVE
Influenza B Antigen, POC: NEGATIVE

## 2023-11-30 MED ORDER — BENZONATATE 100 MG PO CAPS: 100.0000 mg | ORAL_CAPSULE | Freq: Three times a day (TID) | ORAL | 0 refills | Status: DC

## 2023-11-30 MED ORDER — PROMETHAZINE-DM 6.25-15 MG/5ML PO SYRP: 5.0000 mL | ORAL_SOLUTION | Freq: Every evening | ORAL | 0 refills | Status: AC

## 2023-11-30 MED ORDER — ALBUTEROL SULFATE HFA 108 (90 BASE) MCG/ACT IN AERS: 2.0000 | INHALATION_SPRAY | Freq: Four times a day (QID) | RESPIRATORY_TRACT | 0 refills | Status: DC | PRN

## 2023-11-30 NOTE — ED Triage Notes (Signed)
 Pt states cough and congestion for the past 4 days.  States she has been taking Delsym and Mucinex at home.

## 2023-11-30 NOTE — ED Provider Notes (Signed)
 [facility]    CSN: [id number] Arrival date & time: 11/30/23  7829      History   Chief Complaint Chief Complaint  Patient presents with   chest congestion    HPI Kimberly Cox is a 78 y.o. female.   HPI Cough for 5 days described as dry, admits a lot of chest congestion, has had fever last fever yesterday to 100.2.  Admits fatigue, has had some wheezing with coughing fits.  Denies rhinorrhea, nasal congestion, headache, ear pain, sore throat, swollen glands, chest pain as of breath, nausea, vomiting, diarrhea, back pain.  Denies known contacts with illness.  Denies recent travel.  This medical history hypertension, hyperlipidemia, spinal stenosis.  Allergy to [facility].  Denies smoking.  Denies history of asthma  Past Medical History:  Diagnosis Date   Anxiety    Anxiety disorder    Concussion Dec 13th, 2011   fell, striking her head against a wall, developed dizziness - UC eval c/w concussion. Had follow-up and did ok.    History of chest pain    Hyperlipidemia    Hypertension    Plantar fasciitis    Spinal stenosis, lumbar     Patient Active Problem List   Diagnosis Date Noted   Diuretic-induced hypokalemia 03/31/2020   Sciatica of right side 05/01/2018   Piriformis syndrome of left side 11/27/2017   GERD with esophagitis 08/22/2017   Asthmatic bronchitis 08/22/2017   Prediabetes 02/02/2017   Pure hyperglyceridemia 02/02/2017   Degenerative cervical disc 01/26/2016   Degenerative lumbar disc 01/26/2016   Spinal stenosis of lumbar region with radiculopathy 01/20/2016   Routine general medical examination at a health care facility 07/18/2014   Hereditary and idiopathic peripheral neuropathy 01/31/2008   Hyperlipidemia LDL goal <130 12/05/2007   Essential hypertension 12/05/2007    Past Surgical History:  Procedure Laterality Date   ARTHRODESIS METATARSALPHALANGEAL JOINT (MTPJ) Right 10/08/2015   Procedure: RIGHT HALLUX METATARSAL PHALANGEAL JOINT   ARTHRODESIS;  Surgeon: Toni Arthurs, MD;  Location: [facility];  Service: Orthopedics;  Laterality: Right;   caesarean section     CESAREAN SECTION     HAMMERTOE RECONSTRUCTION WITH WEIL OSTEOTOMY Right 10/08/2015   Procedure: RIGHT SECOND METATARSAL WEIL HAMMERTOE CORRECTION;  Surgeon: Toni Arthurs, MD;  Location: [facility];  Service: Orthopedics;  Laterality: Right;   TONSILLECTOMY      OB History     Gravida  2   Para  2   Term  1   Preterm  1   AB  0   Living  2      SAB  0   IAB      Ectopic  0   Multiple      Live Births               Home Medications    Prior to Admission medications   Medication Sig Start Date End Date Taking? Authorizing Provider  irbesartan-hydrochlorothiazide (AVALIDE) 150-12.5 MG tablet Take 1 tablet by mouth daily. 09/19/23  Yes [provider]  celecoxib (CELEBREX) 100 MG capsule TAKE 1 CAPSULE(100 MG) BY MOUTH TWICE DAILY BETWEEN MEALS AS NEEDED 11/06/23   Madelyn Brunner, DO  clobetasol (TEMOVATE) 0.05 % external solution Apply topically daily. 09/18/22   [provider]  co-enzyme Q-10 30 MG capsule Take 30 mg by mouth 3 (three) times daily.    [provider]  irbesartan-hydrochlorothiazide (AVALIDE) 150-12.5 MG tablet Take 1 tablet by mouth daily. 12/18/19 02/08/23  [provider]  methocarbamol (ROBAXIN) 500 MG tablet Take 1 tablet (500 mg total) by mouth 2 (two) times daily. 10/11/23   Lunette Stands, PA-C  omeprazole (PRILOSEC) 40 MG capsule TAKE 1 CAPSULE(40 MG) BY MOUTH DAILY 11/25/20   Etta Grandchild, MD  rosuvastatin (CRESTOR) 5 MG tablet Take 5 mg by mouth daily.  01/17/17   [provider]    Family History Family History  Problem Relation Age of Onset   Cancer Mother    Coronary artery disease Father     Social History Social History   Tobacco Use   Smoking status: Former   Smokeless tobacco: Never   Tobacco comments:    only in h.s.  Vaping  Use   Vaping status: Never Used  Substance Use Topics   Alcohol use: Yes    Comment: occ   Drug use: No     Allergies   Qc tumeric complex [turmeric] and Omnicef [cefdinir]   Review of Systems Review of Systems   Physical Exam Triage Vital Signs ED Triage Vitals  Encounter Vitals Group     BP 11/30/23 0841 134/69     Systolic BP Percentile --      Diastolic BP Percentile --      Pulse Rate 11/30/23 0841 97     Resp 11/30/23 0841 16     Temp 11/30/23 0841 98.3 F (36.8 C)     Temp Source 11/30/23 0841 Oral     SpO2 11/30/23 0841 94 %     Weight --      Height --      Head Circumference --      Peak Flow --      Pain Score 11/30/23 0840 0     Pain Loc --      Pain Education --      Exclude from Growth Chart --    No data found.  Updated Vital Signs BP 134/69 (BP Location: Right Arm)   Pulse 97   Temp 98.3 F (36.8 C) (Oral)   Resp 16   SpO2 94%   Visual Acuity Right Eye Distance:   Left Eye Distance:   Bilateral Distance:    Right Eye Near:   Left Eye Near:    Bilateral Near:     Physical Exam Vitals and nursing note reviewed.  Constitutional:      Appearance: She is not ill-appearing.  HENT:     Head: Normocephalic and atraumatic.     Right Ear: Tympanic membrane and ear canal normal.     Left Ear: Tympanic membrane and ear canal normal.     Nose: No rhinorrhea.     Mouth/Throat:     Mouth: Mucous membranes are moist.     Pharynx: Oropharynx is clear. No oropharyngeal exudate or posterior oropharyngeal erythema.  Cardiovascular:     Rate and Rhythm: Normal rate and regular rhythm.     Heart sounds: Normal heart sounds.  Pulmonary:     Effort: Pulmonary effort is normal. No respiratory distress.     Breath sounds: Normal breath sounds. No wheezing, rhonchi or rales.  Musculoskeletal:     Cervical back: Neck supple.  Lymphadenopathy:     Cervical: No cervical adenopathy.  Skin:    General: Skin is warm and dry.  Neurological:     Mental  Status: She is alert and oriented to person, place, and time.  Psychiatric:        Mood and Affect: Mood normal.  UC Treatments / Results  Labs (all labs ordered are listed, but only abnormal results are displayed) Labs Reviewed  POC COVID19/FLU A&B COMBO    EKG   Radiology No results found.  Procedures Procedures (including critical care time)  Medications Ordered in UC Medications - No data to display  Initial Impression / Assessment and Plan / UC Course  I have reviewed the triage vital signs and the nursing notes.  Pertinent labs & imaging results that were available during my care of the patient were reviewed by me and considered in my medical decision making (see chart for details).     78 year old female for 4 days associated with low-grade fever duction.  Has had some wheezing at home but none noted on exam.  Vital signs are stable lungs are clear to auscultation, point-of-care COVID and flu are negative.Will treat with inhaler and cough medicines, warning signs and follow-up reviewed with patient Final Clinical Impressions(s) / UC Diagnoses   Final diagnoses:  Acute cough   Discharge Instructions   None    ED Prescriptions   None    PDMP not reviewed this encounter.   Meliton Rattan, [place] 11/30/23 [phone]

## 2023-12-04 ENCOUNTER — Ambulatory Visit
Admission: EM | Admit: 2023-12-04 | Discharge: 2023-12-04 | Disposition: A | Discharge status: Home / Self Care | Attending: Family Medicine | Admitting: Family Medicine

## 2023-12-04 ENCOUNTER — Ambulatory Visit (INDEPENDENT_AMBULATORY_CARE_PROVIDER_SITE_OTHER): Admitting: Radiology

## 2023-12-04 ENCOUNTER — Other Ambulatory Visit: Payer: Self-pay

## 2023-12-04 DIAGNOSIS — J189 Pneumonia, unspecified organism: Secondary | ICD-10-CM

## 2023-12-04 DIAGNOSIS — I77819 Aortic ectasia, unspecified site: Secondary | ICD-10-CM | POA: Diagnosis not present

## 2023-12-04 DIAGNOSIS — R051 Acute cough: Secondary | ICD-10-CM | POA: Diagnosis not present

## 2023-12-04 DIAGNOSIS — R0602 Shortness of breath: Secondary | ICD-10-CM | POA: Diagnosis not present

## 2023-12-04 DIAGNOSIS — J90 Pleural effusion, not elsewhere classified: Secondary | ICD-10-CM | POA: Diagnosis not present

## 2023-12-04 DIAGNOSIS — R918 Other nonspecific abnormal finding of lung field: Secondary | ICD-10-CM | POA: Diagnosis not present

## 2023-12-04 MED ORDER — AZITHROMYCIN 250 MG PO TABS: 250.0000 mg | ORAL_TABLET | Freq: Every day | ORAL | 0 refills | Status: DC

## 2023-12-04 MED ORDER — AMOXICILLIN 500 MG PO CAPS: 1000.0000 mg | ORAL_CAPSULE | Freq: Two times a day (BID) | ORAL | 0 refills | Status: AC

## 2023-12-04 NOTE — Discharge Instructions (Addendum)
 See your doctor in 3 days for recheck Go to the emergency department for worsening symptoms such as worsening fever, worsening shortness of breath, lethargy or concerns  The x-ray reading we discussed is preliminary. Your x-ray will be read by a radiologist in next few hours. If there is a discrepancy, you will be contacted, and instructed on a new plan for you care.

## 2023-12-04 NOTE — ED Provider Notes (Signed)
 [name]    CSN: [id number] Arrival date & time: 12/04/23  1009      History   Chief Complaint Chief Complaint  Patient presents with   Ear Fullness   Nasal Congestion    HPI CHANTERIA HAGGARD is a 78 y.o. female.    Ear Fullness  Has been sick for 9 days was seen here 11/30/23, COVID and flu were negative, diagnosed with bronchitis, states since then her symptoms have significantly worsened she is now having right ear fullness and pain, worsening cough, increased sputum production, nasal congestion, sinus pain or pressure, shortness of breath.  Admits fever, last fever this morning.  Denies back pain, dizziness headedness, nausea, vomiting, diarrhea.  Husband is now also sick  Past Medical History:  Diagnosis Date   Anxiety    Anxiety disorder    Concussion Dec 13th, 2011   fell, striking her head against a wall, developed dizziness - UC eval c/w concussion. Had follow-up and did ok.    History of chest pain    Hyperlipidemia    Hypertension    Plantar fasciitis    Spinal stenosis, lumbar     Patient Active Problem List   Diagnosis Date Noted   Diuretic-induced hypokalemia 03/31/2020   Sciatica of right side 05/01/2018   Piriformis syndrome of left side 11/27/2017   GERD with esophagitis 08/22/2017   Asthmatic bronchitis 08/22/2017   Prediabetes 02/02/2017   Pure hyperglyceridemia 02/02/2017   Degenerative cervical disc 01/26/2016   Degenerative lumbar disc 01/26/2016   Spinal stenosis of lumbar region with radiculopathy 01/20/2016   Routine general medical examination at a health care facility 07/18/2014   Hereditary and idiopathic peripheral neuropathy 01/31/2008   Hyperlipidemia LDL goal <130 12/05/2007   Essential hypertension 12/05/2007    Past Surgical History:  Procedure Laterality Date   ARTHRODESIS METATARSALPHALANGEAL JOINT (MTPJ) Right 10/08/2015   Procedure: RIGHT HALLUX METATARSAL PHALANGEAL JOINT  ARTHRODESIS;  Surgeon: Toni Arthurs,  MD;  Location: [facility];  Service: Orthopedics;  Laterality: Right;   caesarean section     CESAREAN SECTION     HAMMERTOE RECONSTRUCTION WITH WEIL OSTEOTOMY Right 10/08/2015   Procedure: RIGHT SECOND METATARSAL WEIL HAMMERTOE CORRECTION;  Surgeon: Toni Arthurs, MD;  Location: [facility];  Service: Orthopedics;  Laterality: Right;   TONSILLECTOMY      OB History     Gravida  2   Para  2   Term  1   Preterm  1   AB  0   Living  2      SAB  0   IAB      Ectopic  0   Multiple      Live Births               Home Medications    Prior to Admission medications   Medication Sig Start Date End Date Taking? Authorizing Provider  albuterol (VENTOLIN HFA) 108 (90 Base) MCG/ACT inhaler Inhale 2 puffs into the lungs every 6 (six) hours as needed for up to 10 days for wheezing or shortness of breath. 11/30/23 12/10/23  Meliton Rattan, PA  benzonatate (TESSALON) 100 MG capsule Take 1 capsule (100 mg total) by mouth every 8 (eight) hours. 11/30/23   Meliton Rattan, PA  celecoxib (CELEBREX) 100 MG capsule TAKE 1 CAPSULE(100 MG) BY MOUTH TWICE DAILY BETWEEN MEALS AS NEEDED 11/06/23   Madelyn Brunner, DO  clobetasol (TEMOVATE) 0.05 % external solution Apply topically daily. 09/18/22  [provider]  co-enzyme Q-10 30 MG capsule Take 30 mg by mouth 3 (three) times daily.    [provider]  irbesartan-hydrochlorothiazide (AVALIDE) 150-12.5 MG tablet Take 1 tablet by mouth daily. 12/18/19 02/08/23  [provider]  irbesartan-hydrochlorothiazide (AVALIDE) 150-12.5 MG tablet Take 1 tablet by mouth daily. 09/19/23   [provider]  methocarbamol (ROBAXIN) 500 MG tablet Take 1 tablet (500 mg total) by mouth 2 (two) times daily. 10/11/23   Lunette Stands, PA-C  omeprazole (PRILOSEC) 40 MG capsule TAKE 1 CAPSULE(40 MG) BY MOUTH DAILY 11/25/20   Etta Grandchild, MD  promethazine-dextromethorphan (PROMETHAZINE-DM) 6.25-15 MG/5ML syrup Take  5 mLs by mouth at bedtime for 10 days. 11/30/23 12/10/23  Meliton Rattan, PA  rosuvastatin (CRESTOR) 5 MG tablet Take 5 mg by mouth daily.  01/17/17   [provider]    Family History Family History  Problem Relation Age of Onset   Cancer Mother    Coronary artery disease Father     Social History Social History   Tobacco Use   Smoking status: Former   Smokeless tobacco: Never   Tobacco comments:    only in h.s.  Vaping Use   Vaping status: Never Used  Substance Use Topics   Alcohol use: Yes    Comment: occ   Drug use: No     Allergies   Qc tumeric complex [turmeric] and Omnicef [cefdinir]   Review of Systems Review of Systems   Physical Exam Triage Vital Signs ED Triage Vitals  Encounter Vitals Group     BP 12/04/23 1022 109/65     Systolic BP Percentile --      Diastolic BP Percentile --      Pulse Rate 12/04/23 1022 (!) 104     Resp 12/04/23 1022 20     Temp 12/04/23 1022 98.2 F (36.8 C)     Temp Source 12/04/23 1022 Oral     SpO2 12/04/23 1022 92 %     Weight 12/04/23 1023 147 lb (66.7 kg)     Height 12/04/23 1023 5\' 5"  (1.651 m)     Head Circumference --      Peak Flow --      Pain Score 12/04/23 1023 5     Pain Loc --      Pain Education --      Exclude from Growth Chart --    No data found.  Updated Vital Signs BP 109/65 (BP Location: Right Arm)   Pulse (!) 104   Temp 98.2 F (36.8 C) (Oral)   Resp 20   Ht 5\' 5"  (1.651 m)   Wt 147 lb (66.7 kg)   SpO2 92%   BMI 24.46 kg/m   Visual Acuity Right Eye Distance:   Left Eye Distance:   Bilateral Distance:    Right Eye Near:   Left Eye Near:    Bilateral Near:     Physical Exam Vitals and nursing note reviewed.  Constitutional:      Appearance: She is not ill-appearing.  HENT:     Head: Normocephalic and atraumatic.     Left Ear: Tympanic membrane normal.     Ears:     Comments: Purulent effusion right TM    Nose: Congestion present.     Mouth/Throat:     Mouth: Mucous  membranes are moist.  Eyes:     Conjunctiva/sclera: Conjunctivae normal.  Cardiovascular:     Rate and Rhythm: Tachycardia present.  Heart sounds: Normal heart sounds.  Pulmonary:     Breath sounds: Rhonchi (Left posterior mid lower feels) present. No wheezing.  Musculoskeletal:     Cervical back: Neck supple.  Neurological:     Mental Status: She is alert and oriented to person, place, and time.  Psychiatric:        Mood and Affect: Mood normal.      UC Treatments / Results  Labs (all labs ordered are listed, but only abnormal results are displayed) Labs Reviewed - No data to display  EKG   Radiology No results found.  Procedures Procedures (including critical care time)  Medications Ordered in UC Medications - No data to display  Initial Impression / Assessment and Plan / UC Course  I have reviewed the triage vital signs and the nursing notes.  Pertinent labs & imaging results that were available during my care of the patient were reviewed by me and considered in my medical decision making (see chart for details).     78 year old non-smoking female with worsening cough x 9 days.  Seen by me several days ago diagnosed with bronchitis.  Since then her symptoms have worsened including worsening cough now having shortness of breath and increased head congestion and right ear pain.  She is noted to be mildly tachycardic, has abnormal lung sounds on the left side, chest x-ray ordered independently viewed by me left lower lobe pneumonia.  Will treat with amoxicillin and azithromycin.  Has allergy to cefdinir but states she can take penicillins fine.  Recommend Delsym, she was counseled to see her doctor in 3 days, ED for worsening symptoms new symptoms or concerns  Final diagnoses:  Acute cough   Discharge Instructions   None    ED Prescriptions   None    PDMP not reviewed this encounter.   Meliton Rattan, [place] 12/04/23 1101

## 2023-12-04 NOTE — ED Triage Notes (Signed)
 Pt presents with complaints of right ear pain/fullness and nasal congestion. Pt states she was seen here on 3/27 and was prescribed medications for her cough. Pt states her symptoms are worse. Pt currently rates her overall pain a 5/10. Pt states she has a decrease in appetite, no energy, and feeling overall weak.

## 2024-01-05 ENCOUNTER — Ambulatory Visit
Admission: EM | Admit: 2024-01-05 | Discharge: 2024-01-05 | Disposition: A | Discharge status: Home / Self Care | Attending: Physician Assistant | Admitting: Physician Assistant

## 2024-01-05 ENCOUNTER — Other Ambulatory Visit (INDEPENDENT_AMBULATORY_CARE_PROVIDER_SITE_OTHER): Payer: Self-pay

## 2024-01-05 ENCOUNTER — Ambulatory Visit (INDEPENDENT_AMBULATORY_CARE_PROVIDER_SITE_OTHER): Admitting: Radiology

## 2024-01-05 ENCOUNTER — Encounter: Payer: Self-pay | Admitting: Sports Medicine

## 2024-01-05 ENCOUNTER — Other Ambulatory Visit: Payer: Self-pay

## 2024-01-05 ENCOUNTER — Ambulatory Visit (INDEPENDENT_AMBULATORY_CARE_PROVIDER_SITE_OTHER): Admitting: Sports Medicine

## 2024-01-05 DIAGNOSIS — R29898 Other symptoms and signs involving the musculoskeletal system: Secondary | ICD-10-CM

## 2024-01-05 DIAGNOSIS — M67952 Unspecified disorder of synovium and tendon, left thigh: Secondary | ICD-10-CM | POA: Diagnosis not present

## 2024-01-05 DIAGNOSIS — M25552 Pain in left hip: Secondary | ICD-10-CM

## 2024-01-05 DIAGNOSIS — I771 Stricture of artery: Secondary | ICD-10-CM | POA: Diagnosis not present

## 2024-01-05 DIAGNOSIS — Z8701 Personal history of pneumonia (recurrent): Secondary | ICD-10-CM | POA: Diagnosis not present

## 2024-01-05 DIAGNOSIS — M5412 Radiculopathy, cervical region: Secondary | ICD-10-CM | POA: Diagnosis not present

## 2024-01-05 DIAGNOSIS — H6591 Unspecified nonsuppurative otitis media, right ear: Secondary | ICD-10-CM | POA: Diagnosis not present

## 2024-01-05 NOTE — ED Provider Notes (Signed)
 [name]    CSN: [id number] Arrival date & time: 01/05/24  1309      History   Chief Complaint Chief Complaint  Patient presents with   Follow-up   Ear Fullness    HPI Kimberly Cox is a 78 y.o. female.   HPI  She reports she was seen in UC at the end of March and was eventually dx with pneumonia- she was treated with CAP protocol (augmentin and Zpack)  She states she was getting better but over the last few days she has developed sinus pressure and ear fullness She would like to have her ear checked and repeat CXR for follow up   She has been using Afrin since yesterday. She has also been using Sudafed since last Sunday to help with her ear   Past Medical History:  Diagnosis Date   Anxiety    Anxiety disorder    Concussion Dec 13th, 2011   fell, striking her head against a wall, developed dizziness - UC eval c/w concussion. Had follow-up and did ok.    History of chest pain    Hyperlipidemia    Hypertension    Plantar fasciitis    Spinal stenosis, lumbar     Patient Active Problem List   Diagnosis Date Noted   Diuretic-induced hypokalemia 03/31/2020   Sciatica of right side 05/01/2018   Piriformis syndrome of left side 11/27/2017   GERD with esophagitis 08/22/2017   Asthmatic bronchitis 08/22/2017   Prediabetes 02/02/2017   Pure hyperglyceridemia 02/02/2017   Degenerative cervical disc 01/26/2016   Degenerative lumbar disc 01/26/2016   Spinal stenosis of lumbar region with radiculopathy 01/20/2016   Routine general medical examination at a health care facility 07/18/2014   Hereditary and idiopathic peripheral neuropathy 01/31/2008   Hyperlipidemia LDL goal <130 12/05/2007   Essential hypertension 12/05/2007    Past Surgical History:  Procedure Laterality Date   ARTHRODESIS METATARSALPHALANGEAL JOINT (MTPJ) Right 10/08/2015   Procedure: RIGHT HALLUX METATARSAL PHALANGEAL JOINT  ARTHRODESIS;  Surgeon: Amada Backer, MD;  Location: [facility];  Service: Orthopedics;  Laterality: Right;   caesarean section     CESAREAN SECTION     HAMMERTOE RECONSTRUCTION WITH WEIL OSTEOTOMY Right 10/08/2015   Procedure: RIGHT SECOND METATARSAL WEIL HAMMERTOE CORRECTION;  Surgeon: Amada Backer, MD;  Location: [facility];  Service: Orthopedics;  Laterality: Right;   TONSILLECTOMY      OB History     Gravida  2   Para  2   Term  1   Preterm  1   AB  0   Living  2      SAB  0   IAB      Ectopic  0   Multiple      Live Births               Home Medications    Prior to Admission medications   Medication Sig Start Date End Date Taking? Authorizing Provider  albuterol  (VENTOLIN  HFA) 108 (90 Base) MCG/ACT inhaler Inhale 2 puffs into the lungs every 6 (six) hours as needed for up to 10 days for wheezing or shortness of breath. 11/30/23 12/10/23  Acevedo, Angela, PA  azithromycin  (ZITHROMAX ) 250 MG tablet Take 1 tablet (250 mg total) by mouth daily. Take first 2 tablets together, then 1 every day until finished. 12/04/23   Acevedo, Angela, PA  benzonatate  (TESSALON ) 100 MG capsule Take 1 capsule (100 mg total) by mouth every 8 (  eight) hours. 11/30/23   Acevedo, Angela, PA  celecoxib  (CELEBREX ) 100 MG capsule TAKE 1 CAPSULE(100 MG) BY MOUTH TWICE DAILY BETWEEN MEALS AS NEEDED 11/06/23   Brooks, Dana, DO  clobetasol (TEMOVATE) 0.05 % external solution Apply topically daily. 09/18/22   [provider]  co-enzyme Q-10 30 MG capsule Take 30 mg by mouth 3 (three) times daily.    [provider]  irbesartan-hydrochlorothiazide (AVALIDE) 150-12.5 MG tablet Take 1 tablet by mouth daily. 12/18/19 02/08/23  [provider]  irbesartan-hydrochlorothiazide (AVALIDE) 150-12.5 MG tablet Take 1 tablet by mouth daily. 09/19/23   [provider]  methocarbamol  (ROBAXIN ) 500 MG tablet Take 1 tablet (500 mg total) by mouth 2 (two) times daily. 10/11/23   Bauer, Collin S, PA-C  omeprazole  (PRILOSEC) 40  MG capsule TAKE 1 CAPSULE(40 MG) BY MOUTH DAILY 11/25/20   Arcadio Knuckles, MD  rosuvastatin (CRESTOR) 5 MG tablet Take 5 mg by mouth daily.  01/17/17   [provider]    Family History Family History  Problem Relation Age of Onset   Cancer Mother    Coronary artery disease Father     Social History Social History   Tobacco Use   Smoking status: Former   Smokeless tobacco: Never   Tobacco comments:    only in h.s.  Vaping Use   Vaping status: Never Used  Substance Use Topics   Alcohol use: Yes    Comment: occ   Drug use: No     Allergies   Qc tumeric complex [turmeric] and Omnicef  [cefdinir ]   Review of Systems Review of Systems  Constitutional:  Positive for chills (last night- mild).  HENT:  Positive for congestion, ear pain (ear fullness), postnasal drip and rhinorrhea. Negative for sore throat.   Respiratory:  Positive for cough. Negative for chest tightness, shortness of breath and wheezing.   Gastrointestinal:  Negative for diarrhea, nausea and vomiting.     Physical Exam Triage Vital Signs ED Triage Vitals  Encounter Vitals Group     BP 01/05/24 1319 133/80     Systolic BP Percentile --      Diastolic BP Percentile --      Pulse Rate 01/05/24 1319 83     Resp 01/05/24 1319 16     Temp 01/05/24 1319 98 F (36.7 C)     Temp Source 01/05/24 1319 Oral     SpO2 01/05/24 1319 94 %     Weight 01/05/24 1319 150 lb (68 kg)     Height 01/05/24 1319 5\' 5"  (1.651 m)     Head Circumference --      Peak Flow --      Pain Score 01/05/24 1341 0     Pain Loc --      Pain Education --      Exclude from Growth Chart --    No data found.  Updated Vital Signs BP 133/80 (BP Location: Right Arm)   Pulse 83   Temp 98 F (36.7 C) (Oral)   Resp 16   Ht 5\' 5"  (1.651 m)   Wt 150 lb (68 kg)   SpO2 94%   BMI 24.96 kg/m   Visual Acuity Right Eye Distance:   Left Eye Distance:   Bilateral Distance:    Right Eye Near:   Left Eye Near:    Bilateral  Near:     Physical Exam Vitals reviewed.  Constitutional:      General: She is awake. She is not in  acute distress.    Appearance: Normal appearance. She is well-developed and well-groomed. She is not ill-appearing, toxic-appearing or diaphoretic.  HENT:     Head: Normocephalic and atraumatic.     Right Ear: Hearing and ear canal normal. A middle ear effusion is present. Tympanic membrane is erythematous and bulging.     Left Ear: Hearing, tympanic membrane and ear canal normal.     Ears:     Comments: Mild bulging TM on right with signs of mid ear effusion     Mouth/Throat:     Lips: Pink.     Mouth: Mucous membranes are moist.     Pharynx: Oropharynx is clear. Uvula midline. No pharyngeal swelling, oropharyngeal exudate, posterior oropharyngeal erythema, uvula swelling or postnasal drip.  Cardiovascular:     Rate and Rhythm: Normal rate and regular rhythm.     Pulses: Normal pulses.          Radial pulses are 2+ on the right side and 2+ on the left side.     Heart sounds: Normal heart sounds. No murmur heard.    No friction rub. No gallop.  Pulmonary:     Effort: Pulmonary effort is normal.     Breath sounds: Normal breath sounds. No decreased air movement. No decreased breath sounds, wheezing, rhonchi or rales.  Musculoskeletal:     Cervical back: Normal range of motion and neck supple.  Lymphadenopathy:     Head:     Right side of head: No submental, submandibular or preauricular adenopathy.     Left side of head: No submental, submandibular or preauricular adenopathy.     Cervical:     Right cervical: No superficial cervical adenopathy.    Left cervical: No superficial cervical adenopathy.     Upper Body:     Right upper body: No supraclavicular adenopathy.     Left upper body: No supraclavicular adenopathy.  Neurological:     Mental Status: She is alert.  Psychiatric:        Behavior: Behavior is cooperative.      UC Treatments / Results  Labs (all labs ordered  are listed, but only abnormal results are displayed) Labs Reviewed - No data to display  EKG   Radiology DG Chest 2 View Result Date: 01/05/2024 CLINICAL DATA:  Pneumonia. EXAM: CHEST - 2 VIEW COMPARISON:  X-ray 12/04/2023 FINDINGS: No consolidation, pneumothorax or effusion. No edema. Normal cardiopericardial silhouette. Tortuous ectatic aorta. Mild degenerative changes of the spine. Air-fluid level along the stomach beneath the left hemidiaphragm. IMPRESSION: No acute cardiopulmonary disease. Electronically Signed   By: Adrianna Horde M.D.   On: 01/05/2024 14:28   XR HIP UNILAT W OR W/O PELVIS 2-3 VIEWS LEFT Result Date: 01/05/2024 2 view x-ray of the left hip including AP pelvis and left lateral hip x-ray were ordered and reviewed by myself today.  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  There is no acute fracture or advanced arthritic change.  No spurring of the greater tuberosity.   Procedures Procedures (including critical care time)  Medications Ordered in UC Medications - No data to display  Initial Impression / Assessment and Plan / UC Course  I have reviewed the triage vital signs and the nursing notes.  Pertinent labs & imaging results that were available during my care of the patient were reviewed by me and considered in my medical decision making (see chart for details).      Final Clinical Impressions(s) / UC Diagnoses  Final diagnoses:  Hx of bacterial pneumonia  Fluid level behind tympanic membrane of right ear   Patient presents today with concerns of ear fullness of the right ear and runny nose, nasal congestion.  She is also concerned regarding her previous diagnosis of pneumonia.  She would like to repeat chest x-ray today to ensure that this has resolved completely.  Chest x-ray was reviewed by radiology and did not show signs of acute cardiopulmonary disease.  Results of chest x-ray were discussed with patient during her  discharge. Patient's physical exam was overall reassuring.  She did have signs of mild middle ear effusion on the right but no signs of infection or purulent drainage.  At this time suspect potential allergies as cause of her current symptoms.  Suspect that she may have some mild eustachian tube dysfunction causing ear fullness.  Recommend using a second-generation antihistamine as well as Flonase nasal spray to assist with symptoms.  Reviewed that she should not use Afrin for more than 3 days as this can cause rhinitis medicamentosa and lead to worsening of current symptoms.  Lung sounds are overall reassuring today as are vitals.  Reviewed over-the-counter medications to assist with symptomatic relief.  Return precautions reviewed as well.  Recommend close follow-up with primary care provider for ongoing symptoms should these occur.    Discharge Instructions      Your chest x-ray does not show any obvious signs of continued pneumonia or consolidation.  We are still waiting on radiology for a final review and we will keep you updated with the results once radiology has finished examining your x-ray.  If any medications or treatment is indicated after this we will let you know with the call regarding your results. You were seen today for concerns of continued ear fullness as well as sinus congestion and runny nose. At this time your symptoms do not appear to be due to an infectious process.  I suspect likely allergy involvement which is probably causing the fullness in your ears as well as nasal symptoms. I would recommend the following:  Taking a daily antihistamine such as Claritin, Allegra, Zyrtec - the generics of these work just as good as the brand names I would recommend taking one of these at night if they cause drowsiness  You can also try using nasal saline flushes and Neti pot with boiled, cooled water or distilled water - NO TAP WATER  You can also try using a nasal spray such as Flonase  or azelastine to provide further relief.        ED Prescriptions   None    PDMP not reviewed this encounter.   Shaquanna Lycan, Pearla Bottom, PA-C 01/05/24 1440

## 2024-01-05 NOTE — ED Triage Notes (Signed)
 Pt presents with complaints of right ear fullness x 3 weeks and sinus pressure (pain in top teeth) x 4 days. Pt states she was seen here last on 3/31 and was diagnosed with pneumonia. Has a primary care appointment on 5/20. Office recommended a repeat chest x-ray to ensure pneumonia is now resolved. Pt currently denies pain. Sudafed and nasal spray taken with no relief.

## 2024-01-05 NOTE — Discharge Instructions (Addendum)
 Your chest x-ray does not show any obvious signs of continued pneumonia or consolidation.  We are still waiting on radiology for a final review and we will keep you updated with the results once radiology has finished examining your x-ray.  If any medications or treatment is indicated after this we will let you know with the call regarding your results. You were seen today for concerns of continued ear fullness as well as sinus congestion and runny nose. At this time your symptoms do not appear to be due to an infectious process.  I suspect likely allergy involvement which is probably causing the fullness in your ears as well as nasal symptoms. I would recommend the following:  Taking a daily antihistamine such as Claritin, Allegra, Zyrtec - the generics of these work just as good as the brand names I would recommend taking one of these at night if they cause drowsiness  You can also try using nasal saline flushes and Neti pot with boiled, cooled water or distilled water - NO TAP WATER  You can also try using a nasal spray such as Flonase or azelastine to provide further relief.

## 2024-01-05 NOTE — Progress Notes (Signed)
 Kimberly Cox - 78 y.o. female MRN [id number]  Date of birth: 11-08-1945  Office Visit Note: Visit Date: 01/05/2024 PCP: Arcadio Knuckles, MD Referred by: Arcadio Knuckles, MD  Subjective: Chief Complaint  Patient presents with   Left Hip - Pain   HPI: Kimberly Cox is a pleasant 78 y.o. female who presents today for evaluation of left lateral hip pain.  Tristi has a history of right lateral hip pain with MRI confirmed bursitis as well as superior gluteal tendinopathy with undersurface tearing.  She still has some mild pain in the right hip but this significantly improved with multiple treatments of extracorporeal shockwave therapy.  For just over the past 1 month her left lateral hip has not been bothering her.  She has pain on this when she puts pressure onto the left side as well as walking or going up stairs.  She does get some pain laying on that side.  Denies any redness or known swelling.  She does take Celebrex  100 mg twice daily.  For her spinal radiculopathy, she does have a CT-guided cervical ESI scheduled for this upcoming Tuesday.,  See note reviewed below under clinical information.  Pertinent ROS were reviewed with the patient and found to be negative unless otherwise specified above in HPI.   Assessment & Plan: Visit Diagnoses:  1. Pain in left hip   2. Tendinopathy of left gluteus medius   3. Weakness of left hip   4. Spinal stenosis of lumbar region with radiculopathy    Plan: Impression is history of bilateral lateral hip pain with MRI confirmed severe tendinosis and undersurface tearing of the gluteal tendons. The right hip in the past responded very well to extracorporeal shockwave therapy and a guided exercise regimen rehab plan.  Her left hip is now becoming bothersome, I cannot appreciate any bursitis or swelling on exam, but she does have notable weakness with hip abduction.  We did perform our first treatment of extracorporeal shockwave therapy today.  I would  like her to set 2 additional treatments up for the lateral hip to see what sort of cumulative benefit she has going forward.  We did give her a new handout for hip abduction and stabilization exercises today, she will begin these once daily.  She may use her Celebrex  100 mg twice daily for pain control.  I will see her back in 1 week to repeat ESWT and further reevaluation.  *Additional treatment considerations may include one-time corticosteroid injection, nitroglycerin patch, PRP  Follow-up: Return for make 2 appts about 1-week apart L-lat hip.   Meds & Orders: No orders of the defined types were placed in this encounter.   Orders Placed This Encounter  Procedures   XR HIP UNILAT W OR W/O PELVIS 2-3 VIEWS LEFT     Procedures: Procedure: ECSWT Indications:  Gluteus minimus/medius tendinopathy   Procedure Details Consent: Risks of procedure as well as the alternatives and risks of each were explained to the patient.  Verbal consent for procedure obtained. Time Out: Verified patient identification, verified procedure, site was marked, verified correct patient position. The area was cleaned with alcohol swab.     The left GT and gluteus tendons were targeted for Extracorporeal shockwave therapy.    Preset: Trochanteric bursitis/lateral hip pain Power Level: 110-120 mJ  Frequency: 12-13 Hz Impulse/cycles: 2800 Head size: Regular   Patient tolerated procedure well without immediate complications.      Clinical History: No specialty comments available.  She reports  that she has quit smoking. She has never used smokeless tobacco. No results for input(s): "HGBA1C", "LABURIC" in the last 8760 hours.  She has been seeing [facility] system for radicular pain with spinal stenosis. Note reviewed from 12/27/23, which states: "She has a chief complaint of neck, shoulder and right radiating arm pain. Arm pain appears to be in the C5 distribution. She will get occasional pain, numbness and  tingling down into her forearm. It does not go into any of her fingers. She has been treating this pain with medicines, activity modification, rest, heat/ice, massage, PT and an HEP program for her neck and interscapular muscles, etc. The pain is now getting to the point where it severely affects her quality of life. She has gone to the emergency room for this pain. Of note she does have microtears in her rotator cuff for which she is seeing a specialist for. MRI of the cervical spine shows retrolisthesis of C4 on C5 with severe foraminal stenosis laterally at C4-5, C3-4 on the right and at C5-6 on the left. Given her pain complaints and failure to treat with the conservative modalities listed above I recommend a CT-guided injection to help with her pain complaints. She is in agreement with this plan. Order placed for injection and patient was provided with the telephone number to schedule. "  Objective:    Physical Exam  Gen: Well-appearing, in no acute distress; non-toxic CV: Well-perfused. Warm.  Resp: Breathing unlabored on room air; no wheezing. Psych: Fluid speech in conversation; appropriate affect; normal thought process  Ortho Exam - Left hip: + mild TTP over the greater trochanteric region and the insertional gluteal tendons.  There is no warmth or swelling noted here.  There is 3.5/5 strength with resisted hip abduction compared to improve strength on the contralateral side.  There is about 5 degrees less of internal and endrange external rotation with logroll although no hard bony restriction. + FABERE.  Imaging:  XR HIP UNILAT W OR W/O PELVIS 2-3 VIEWS LEFT 2 view x-ray of the left hip including AP pelvis and left lateral hip  x-ray were ordered and reviewed by myself today.  X-rays demonstrate at  least mild osteoarthritic change of bilateral hips.  There is no acute  fracture or advanced arthritic change.  No spurring of the greater  tuberosity.  *Independent review and  interpretation of right hip MRI from 02/26/2023 was performed by myself today with attention to the left, contralateral hip.  There is mild to moderate insertional gluteus medius > minimus tendinopathy.  There is some mild signal change here with possible degree of undersurface tearing but certainly no high-grade tearing.  There is no reciprocal bursitis present on this MRI.  Narrative & Impression  CLINICAL DATA:  Hip pain, chronic, articular cartilage eval, xray done assess cartilage, troch bursitis, assess tendon   EXAM: MR OF THE RIGHT HIP WITHOUT CONTRAST   TECHNIQUE: Multiplanar, multisequence MR imaging was performed. No intravenous contrast was administered.   COMPARISON:  X-ray 02/06/2023   FINDINGS: Bones: No acute fracture. No dislocation. No femoral head avascular necrosis. Bony pelvis intact without diastasis. SI joints and pubic symphysis within normal limits. Degenerative lower lumbar spondylosis, not well assessed. No bone marrow edema. No marrow replacing bone lesion.   Articular cartilage and labrum   Articular cartilage: Mild chondral thinning. There is fissuring along the superior chondrolabral junction. No subchondral marrow edema.   Labrum:  Superior labral degeneration.  No paralabral cyst.  Joint or bursal effusion   Joint effusion:  None.   Bursae: Small-moderate volume right peritrochanteric bursal fluid.   Muscles and tendons   Muscles and tendons: Severe tendinosis of the right gluteus minimus tendon with high-grade partial-thickness insertional tearing. Mild right gluteus medius tendinosis without tear. Mild tendinosis of the contralateral left gluteal tendons. The hamstring, iliopsoas, rectus femoris, and adductor tendons appear intact without tear or significant tendinosis. Mild intramuscular edema within the proximal adductor compartment musculature, right greater than left. Mild intramuscular edema within the right gluteus medius muscle.    Other findings   Miscellaneous: No soft tissue edema or fluid collection. No inguinal lymphadenopathy.   IMPRESSION: 1. Severe tendinosis of the right gluteus minimus tendon with high-grade partial-thickness insertional tearing. Mild right gluteus medius tendinosis without tear. 2. Mild-moderate right peritrochanteric bursitis. 3. Mild intramuscular edema within the proximal adductor compartment musculature, right greater than left, and right gluteus medius muscle which may reflect low-grade strains. 4. Mild right hip osteoarthritis.     Electronically Signed   By: Leverne Reading D.O.   On: 03/01/2023 13:55    Past Medical/Family/Surgical/Social History: Medications & Allergies reviewed per EMR, new medications updated. Patient Active Problem List   Diagnosis Date Noted   Diuretic-induced hypokalemia 03/31/2020   Sciatica of right side 05/01/2018   Piriformis syndrome of left side 11/27/2017   GERD with esophagitis 08/22/2017   Asthmatic bronchitis 08/22/2017   Prediabetes 02/02/2017   Pure hyperglyceridemia 02/02/2017   Degenerative cervical disc 01/26/2016   Degenerative lumbar disc 01/26/2016   Spinal stenosis of lumbar region with radiculopathy 01/20/2016   Routine general medical examination at a health care facility 07/18/2014   Hereditary and idiopathic peripheral neuropathy 01/31/2008   Hyperlipidemia LDL goal <130 12/05/2007   Essential hypertension 12/05/2007   Past Medical History:  Diagnosis Date   Anxiety    Anxiety disorder    Concussion Dec 13th, 2011   fell, striking her head against a wall, developed dizziness - UC eval c/w concussion. Had follow-up and did ok.    History of chest pain    Hyperlipidemia    Hypertension    Plantar fasciitis    Spinal stenosis, lumbar    Family History  Problem Relation Age of Onset   Cancer Mother    Coronary artery disease Father    Past Surgical History:  Procedure Laterality Date   ARTHRODESIS  METATARSALPHALANGEAL JOINT (MTPJ) Right 10/08/2015   Procedure: RIGHT HALLUX METATARSAL PHALANGEAL JOINT  ARTHRODESIS;  Surgeon: Amada Backer, MD;  Location: [facility];  Service: Orthopedics;  Laterality: Right;   caesarean section     CESAREAN SECTION     HAMMERTOE RECONSTRUCTION WITH WEIL OSTEOTOMY Right 10/08/2015   Procedure: RIGHT SECOND METATARSAL WEIL HAMMERTOE CORRECTION;  Surgeon: Amada Backer, MD;  Location: [facility];  Service: Orthopedics;  Laterality: Right;   TONSILLECTOMY     Social History   Occupational History   Not on file  Tobacco Use   Smoking status: Former   Smokeless tobacco: Never   Tobacco comments:    only in h.s.  Vaping Use   Vaping status: Never Used  Substance and Sexual Activity   Alcohol use: Yes    Comment: occ   Drug use: No   Sexual activity: Not Currently    Birth control/protection: Post-menopausal    Comment: 1st intercourse- 20, partners- 1

## 2024-01-05 NOTE — Progress Notes (Signed)
 Patient says that she has had pain over the lateral aspect of the left hip for about one month now. She says that it does feel similar to the pain that she had on the right hip, and is inquiring about shockwave therapy for the left side as well. She says that she mostly feels it when she puts pressure through the left leg when walking or going up stairs, but it is also painful if she lays on that side.

## 2024-01-08 ENCOUNTER — Other Ambulatory Visit: Payer: Self-pay | Admitting: Sports Medicine

## 2024-01-09 DIAGNOSIS — M5416 Radiculopathy, lumbar region: Secondary | ICD-10-CM | POA: Diagnosis not present

## 2024-01-09 DIAGNOSIS — M48061 Spinal stenosis, lumbar region without neurogenic claudication: Secondary | ICD-10-CM | POA: Diagnosis not present

## 2024-01-12 ENCOUNTER — Ambulatory Visit (INDEPENDENT_AMBULATORY_CARE_PROVIDER_SITE_OTHER): Admitting: Sports Medicine

## 2024-01-12 ENCOUNTER — Encounter: Payer: Self-pay | Admitting: Sports Medicine

## 2024-01-12 DIAGNOSIS — M48061 Spinal stenosis, lumbar region without neurogenic claudication: Secondary | ICD-10-CM | POA: Diagnosis not present

## 2024-01-12 DIAGNOSIS — M5416 Radiculopathy, lumbar region: Secondary | ICD-10-CM | POA: Diagnosis not present

## 2024-01-12 DIAGNOSIS — M25552 Pain in left hip: Secondary | ICD-10-CM | POA: Diagnosis not present

## 2024-01-12 DIAGNOSIS — M67952 Unspecified disorder of synovium and tendon, left thigh: Secondary | ICD-10-CM

## 2024-01-12 NOTE — Progress Notes (Signed)
 Patient says that in the first couple of days after her first shockwave treatment she felt about 5-10% improvement. She says that she had an injection for her back which gave her relief of all of her additional pain, including her hip pain. She says that she has no pain or discomfort today, so is unsure if she should continue with the shockwave now.

## 2024-01-12 NOTE — Progress Notes (Signed)
 Kimberly Cox - 78 y.o. female MRN [id number]  Date of birth: February 08, 1946  Office Visit Note: Visit Date: 01/12/2024 PCP: Arcadio Knuckles, MD Referred by: Arcadio Knuckles, MD  Subjective: Chief Complaint  Patient presents with   Left Hip - Follow-up   HPI: Kimberly Cox is a pleasant 78 y.o. female who presents today for follow-up of left lateral hip pain..  We did proceed with extracorporeal shockwave therapy for her gluteal tendons on the left lateral hip last week.  She did notice some improvement initially but then a few days later she had a CT-guided lumbar spine injection which significantly improved her back pain but also help with her hip pain.  She is not having any pain or discomfort about the hip.  She has been consistent with her home exercise regimen for lateral hip abduction strengthening and hip stabilization.  Pertinent ROS were reviewed with the patient and found to be negative unless otherwise specified above in HPI.   Assessment & Plan: Visit Diagnoses:  1. Tendinopathy of left gluteus medius   2. Pain in left hip   3. Spinal stenosis of lumbar region with radiculopathy    Plan: Impression is significantly improved left lateral hip pain as well as her spinal stenosis from CT-guided injection.  She still has a degree of tendinopathy of the left gluteus medius, but her strength is improved even from her prior shockwave visit.  At this point, given she has no pain I think she would best be served by continuing her home exercise rehab on a daily basis to improve her strength and stability about the tendon.  We will check back in in about 1 month to reevaluate.  If she feels like she has not made significant progress and/or her strength is not applicable to the other side, we can consider additional shockwave treatments.  Also discussed injection (CSI vs. PRP).   Follow-up: Return in about 1 month (around 02/12/2024), or if symptoms worsen or fail to improve, for L-lat hip.    Meds & Orders: No orders of the defined types were placed in this encounter.  No orders of the defined types were placed in this encounter.    Procedures: No procedures performed      Clinical History: No specialty comments available.  She reports that she has quit smoking. She has never used smokeless tobacco. No results for input(s): "HGBA1C", "LABURIC" in the last 8760 hours.  Objective:   Physical Exam  Gen: Well-appearing, in no acute distress; non-toxic CV: Well-perfused. Warm.  Resp: Breathing unlabored on room air; no wheezing. Psych: Fluid speech in conversation; appropriate affect; normal thought process  Ortho Exam - Left hip: There is no significant tenderness about the greater trochanteric region.  There is improved hip abduction strength compared to previous visit.  This is 4.5/5 compared to the contralateral side.  Imaging: No results found.  Past Medical/Family/Surgical/Social History: Medications & Allergies reviewed per EMR, new medications updated. Patient Active Problem List   Diagnosis Date Noted   Diuretic-induced hypokalemia 03/31/2020   Sciatica of right side 05/01/2018   Piriformis syndrome of left side 11/27/2017   GERD with esophagitis 08/22/2017   Asthmatic bronchitis 08/22/2017   Prediabetes 02/02/2017   Pure hyperglyceridemia 02/02/2017   Degenerative cervical disc 01/26/2016   Degenerative lumbar disc 01/26/2016   Spinal stenosis of lumbar region with radiculopathy 01/20/2016   Routine general medical examination at a health care facility 07/18/2014   Hereditary and idiopathic peripheral neuropathy  01/31/2008   Hyperlipidemia LDL goal <130 12/05/2007   Essential hypertension 12/05/2007   Past Medical History:  Diagnosis Date   Anxiety    Anxiety disorder    Concussion Dec 13th, 2011   fell, striking her head against a wall, developed dizziness - UC eval c/w concussion. Had follow-up and did ok.    History of chest pain     Hyperlipidemia    Hypertension    Plantar fasciitis    Spinal stenosis, lumbar    Family History  Problem Relation Age of Onset   Cancer Mother    Coronary artery disease Father    Past Surgical History:  Procedure Laterality Date   ARTHRODESIS METATARSALPHALANGEAL JOINT (MTPJ) Right 10/08/2015   Procedure: RIGHT HALLUX METATARSAL PHALANGEAL JOINT  ARTHRODESIS;  Surgeon: Amada Backer, MD;  Location: [facility];  Service: Orthopedics;  Laterality: Right;   caesarean section     CESAREAN SECTION     HAMMERTOE RECONSTRUCTION WITH WEIL OSTEOTOMY Right 10/08/2015   Procedure: RIGHT SECOND METATARSAL WEIL HAMMERTOE CORRECTION;  Surgeon: Amada Backer, MD;  Location: [facility];  Service: Orthopedics;  Laterality: Right;   TONSILLECTOMY     Social History   Occupational History   Not on file  Tobacco Use   Smoking status: Former   Smokeless tobacco: Never   Tobacco comments:    only in h.s.  Vaping Use   Vaping status: Never Used  Substance and Sexual Activity   Alcohol use: Yes    Comment: occ   Drug use: No   Sexual activity: Not Currently    Birth control/protection: Post-menopausal    Comment: 1st intercourse- 20, partners- 1

## 2024-01-19 ENCOUNTER — Ambulatory Visit: Admitting: Sports Medicine

## 2024-01-23 ENCOUNTER — Encounter: Payer: Self-pay | Admitting: Family Medicine

## 2024-01-23 ENCOUNTER — Ambulatory Visit: Admitting: Family Medicine

## 2024-01-23 VITALS — BP 120/71 | HR 80 | Temp 97.9°F | Resp 18 | Ht 65.0 in | Wt 149.8 lb

## 2024-01-23 DIAGNOSIS — I10 Essential (primary) hypertension: Secondary | ICD-10-CM

## 2024-01-23 DIAGNOSIS — M5416 Radiculopathy, lumbar region: Secondary | ICD-10-CM

## 2024-01-23 DIAGNOSIS — K2100 Gastro-esophageal reflux disease with esophagitis, without bleeding: Secondary | ICD-10-CM

## 2024-01-23 DIAGNOSIS — H6991 Unspecified Eustachian tube disorder, right ear: Secondary | ICD-10-CM | POA: Insufficient documentation

## 2024-01-23 DIAGNOSIS — E785 Hyperlipidemia, unspecified: Secondary | ICD-10-CM

## 2024-01-23 DIAGNOSIS — H6981 Other specified disorders of Eustachian tube, right ear: Secondary | ICD-10-CM | POA: Insufficient documentation

## 2024-01-23 DIAGNOSIS — Z8701 Personal history of pneumonia (recurrent): Secondary | ICD-10-CM | POA: Insufficient documentation

## 2024-01-23 DIAGNOSIS — M48061 Spinal stenosis, lumbar region without neurogenic claudication: Secondary | ICD-10-CM

## 2024-01-23 MED ORDER — PREDNISONE 10 MG PO TABS
ORAL_TABLET | ORAL | 0 refills | Status: AC
Start: 2024-01-23 — End: 2024-01-29

## 2024-01-23 MED ORDER — OMEPRAZOLE 20 MG PO CPDR: 20.0000 mg | DELAYED_RELEASE_CAPSULE | Freq: Every day | ORAL | 3 refills | Status: DC

## 2024-01-23 NOTE — Patient Instructions (Signed)
  VISIT SUMMARY: Today, you came in to establish care and discuss ongoing ear blockage. We reviewed your history of severe spinal stenosis, recent bronchitis that progressed to pneumonia, and your current medications. We also discussed your recent visit to urgent care for right arm pain, which was not related to a heart attack.  YOUR PLAN: -ACUTE BRONCHITIS: You recently had acute bronchitis that progressed to pneumonia. This was treated with an albuterol  inhaler and antibiotics, and your symptoms have improved.  -PNEUMONIA: Your pneumonia developed from acute bronchitis and was treated with antibiotics. While your symptoms have improved, you still have some ear blockage.  -EUSTACHIAN TUBE DYSFUNCTION: This condition is causing the persistent blockage in your right ear, likely due to your recent respiratory infection. We will start you on a prednisone  taper pack for 6 days and continue with Zyrtec and Flonase. You can also use over-the-counter Sudafed for additional relief.  -HYPERTENSION: Your high blood pressure is well-controlled with your current medication, irbesartan hydrochlorothiazide. Your blood pressure today is 120/71 mmHg.  -GASTROESOPHAGEAL REFLUX DISEASE (GERD): Your GERD is managed with omeprazole . Since your symptoms have resolved, you may consider tapering the medication to every other day to see if your symptoms remain controlled.  -HYPERLIPIDEMIA: Your high cholesterol is managed with rosuvastatin, and you have no current issues.  INSTRUCTIONS: Please follow the prescribed prednisone  taper pack for 6 days and continue using Zyrtec and Flonase as advised. You may also use over-the-counter Sudafed for additional relief from ear blockage. Consider tapering your omeprazole  to every other day to assess symptom control. Schedule a follow-up appointment to review your progress and any new symptoms.

## 2024-01-23 NOTE — Progress Notes (Signed)
 Assessment & Plan   Assessment/Plan:    Assessment & Plan Acute bronchitis Recent episode of acute bronchitis in late March progressed to pneumonia. Initial treatment with albuterol  inhaler and other medications was insufficient, leading to further evaluation and diagnosis of pneumonia.  Pneumonia Pneumonia developed from acute bronchitis in late March. Treated with antibiotics including Augmentin and amoxicillin . Symptoms have improved, but residual ear blockage persists.  Eustachian tube dysfunction Persistent right ear blockage since mid-April, likely related to recent respiratory infection. No significant fluid or inflammation on examination. Symptoms include muffled hearing without pain or significant dizziness. Previous treatment with Zyrtec and Flonase provided partial relief. Discussed potential side effects of prednisone  and the option of using over-the-counter Sudafed for symptom relief. - Prescribe prednisone  taper pack for 6 days. - Continue Zyrtec and Flonase as previously advised.  Hypertension Well-controlled hypertension on irbesartan hydrochlorothiazide 150/12.5 mg daily. Blood pressure today is 120/71 mmHg.  Gastroesophageal reflux disease (GERD) GERD managed with omeprazole  20 mg daily. Symptoms previously included chest pain, which resolved with current treatment. She is considering tapering off medication to assess symptom recurrence. - Consider tapering omeprazole  to every other day to assess symptom control.  Hyperlipidemia Hyperlipidemia managed with rosuvastatin 5 mg daily. No current issues reported.      Medications Discontinued During This Encounter  Medication Reason   benzonatate  (TESSALON ) 100 MG capsule    irbesartan-hydrochlorothiazide (AVALIDE) 150-12.5 MG tablet    methocarbamol  (ROBAXIN ) 500 MG tablet    albuterol  (VENTOLIN  HFA) 108 (90 Base) MCG/ACT inhaler    azithromycin  (ZITHROMAX ) 250 MG tablet    celecoxib  (CELEBREX ) 100 MG capsule     omeprazole  (PRILOSEC) 40 MG capsule     Return if symptoms worsen or fail to improve.        Subjective:   Encounter date: 01/23/2024  Kimberly Cox is a 78 y.o. female who has Hyperlipidemia LDL goal <130; Hereditary and idiopathic peripheral neuropathy; Primary hypertension; Routine general medical examination at a health care facility; Spinal stenosis of lumbar region with radiculopathy; Degenerative cervical disc; Degenerative lumbar disc; Prediabetes; Pure hyperglyceridemia; GERD with esophagitis; Asthmatic bronchitis; Piriformis syndrome of left side; Sciatica of right side; Diuretic-induced hypokalemia; Dysfunction of right eustachian tube; and History of pneumonia on their problem list..   She  has a past medical history of Anxiety, Anxiety disorder, Concussion (Dec 13th, 2011), History of chest pain, Hyperlipidemia, Hypertension, Plantar fasciitis, and Spinal stenosis, lumbar..   She presents with chief complaint of Establish Care (No concerns for today ) .   Discussed the use of AI scribe software for clinical note transcription with the patient, who gave verbal consent to proceed.  History of Present Illness Kimberly Cox is a 78 year old female who presents for establishment of care and ongoing ear blockage.  She has a history of severe spinal stenosis, for which she has been receiving injections for several years. She has also been consulting specialists for issues with her shoulders and hips.  In late March to early April, she developed bronchitis which progressed to pneumonia. She was treated at urgent care with a bronchodilator and later with antibiotics after a chest x-ray confirmed pneumonia. Despite treatment, her right ear became blocked during the illness and remains partially blocked. She has been using Zyrtec and Flonase, which has helped somewhat, but the ear is still 'fifty percent less blocked than it was.' No pain, significant dizziness, or drainage from the  ear, but a persistent muffled sensation is noted.  In May,  she visited urgent care due to pain radiating down her right arm. Testing ruled out a heart attack, and the pain was attributed to her use of Celebrex .  Her current medications include irbesartan 150/12.5 mg daily for hypertension, omeprazole  20 mg daily for GERD, and rosuvastatin 5 mg for cholesterol. Her blood pressure is well-controlled at 120/71. She purchases omeprazole  over the counter and manages well on the current dose.     01/23/2024    2:27 PM 03/30/2020   11:07 AM 11/27/2017   11:13 AM 02/02/2017    3:49 PM 08/05/2015   10:02 AM  Depression screen PHQ 2/9  Decreased Interest 0 0 1 3 0  Down, Depressed, Hopeless 0 0 0 0 0  PHQ - 2 Score 0 0 1 3 0  Altered sleeping 1   2   Tired, decreased energy 1   3   Change in appetite 0   1   Feeling bad or failure about yourself  0   1   Trouble concentrating 0   0   Moving slowly or fidgety/restless 0   0   Suicidal thoughts 0   0   PHQ-9 Score 2   10   Difficult doing work/chores Not difficult at all          01/23/2024    2:27 PM 02/02/2017    3:48 PM  GAD 7 : Generalized Anxiety Score  Nervous, Anxious, on Edge 1 1  Control/stop worrying 0 1  Worry too much - different things 0 0  Trouble relaxing 0 0  Restless 0 0  Easily annoyed or irritable 0 0  Afraid - awful might happen 0 0  Total GAD 7 Score 1 2  Anxiety Difficulty Not difficult at all Not difficult at all          Past Surgical History:  Procedure Laterality Date   ARTHRODESIS METATARSALPHALANGEAL JOINT (MTPJ) Right 10/08/2015   Procedure: RIGHT HALLUX METATARSAL PHALANGEAL JOINT  ARTHRODESIS;  Surgeon: Amada Backer, MD;  Location: [facility];  Service: Orthopedics;  Laterality: Right;   caesarean section     CESAREAN SECTION     HAMMERTOE RECONSTRUCTION WITH WEIL OSTEOTOMY Right 10/08/2015   Procedure: RIGHT SECOND METATARSAL WEIL HAMMERTOE CORRECTION;  Surgeon: Amada Backer, MD;   Location: [facility];  Service: Orthopedics;  Laterality: Right;   TONSILLECTOMY      Outpatient Medications Prior to Visit  Medication Sig Dispense Refill   clobetasol (TEMOVATE) 0.05 % external solution Apply topically daily.     co-enzyme Q-10 30 MG capsule Take 30 mg by mouth 3 (three) times daily. Pt is taking 200MG      irbesartan-hydrochlorothiazide (AVALIDE) 150-12.5 MG tablet Take 1 tablet by mouth daily.     rosuvastatin (CRESTOR) 5 MG tablet Take 5 mg by mouth daily.   3   omeprazole  (PRILOSEC) 40 MG capsule TAKE 1 CAPSULE(40 MG) BY MOUTH DAILY (Patient taking differently: Take 20 mg by mouth daily.) 90 capsule 1   albuterol  (VENTOLIN  HFA) 108 (90 Base) MCG/ACT inhaler Inhale 2 puffs into the lungs every 6 (six) hours as needed for up to 10 days for wheezing or shortness of breath. 6.7 each 0   azithromycin  (ZITHROMAX ) 250 MG tablet Take 1 tablet (250 mg total) by mouth daily. Take first 2 tablets together, then 1 every day until finished. 6 tablet 0   benzonatate  (TESSALON ) 100 MG capsule Take 1 capsule (100 mg total) by mouth every 8 (eight) hours.  21 capsule 0   celecoxib  (CELEBREX ) 100 MG capsule TAKE 1 CAPSULE(100 MG) BY MOUTH TWICE DAILY BETWEEN MEALS AS NEEDED (Patient not taking: Reported on 01/23/2024) 60 capsule 1   irbesartan-hydrochlorothiazide (AVALIDE) 150-12.5 MG tablet Take 1 tablet by mouth daily.     methocarbamol  (ROBAXIN ) 500 MG tablet Take 1 tablet (500 mg total) by mouth 2 (two) times daily. 20 tablet 0   No facility-administered medications prior to visit.    Family History  Problem Relation Age of Onset   Cancer Mother    Coronary artery disease Father     Social History   Socioeconomic History   Marital status: Married    Spouse name: Not on file   Number of children: Not on file   Years of education: Not on file   Highest education level: Associate degree: occupational, Scientist, product/process development, or vocational program  Occupational History   Not  on file  Tobacco Use   Smoking status: Former   Smokeless tobacco: Never   Tobacco comments:    only in h.s.  Vaping Use   Vaping status: Never Used  Substance and Sexual Activity   Alcohol use: Yes    Comment: occ   Drug use: No   Sexual activity: Not Currently    Birth control/protection: Post-menopausal    Comment: 1st intercourse- 20, partners- 1  Other Topics Concern   Not on file  Social History Narrative   HSG, community college   Married '68   1 daughter- '80, 1 son- '77; two grandchildren   Work: was a Diplomatic Services operational officer, full time homemaker   Marriage in good health   End of care: no cpr, no mechanical ventalation, no futile or heroic measures.   Social Drivers of Corporate investment banker Strain: Low Risk  (01/19/2024)   Overall Financial Resource Strain (CARDIA)    Difficulty of Paying Living Expenses: Not hard at all  Food Insecurity: No Food Insecurity (01/19/2024)   Hunger Vital Sign    Worried About Running Out of Food in the Last Year: Never true    Ran Out of Food in the Last Year: Never true  Transportation Needs: No Transportation Needs (01/19/2024)   PRAPARE - Administrator, Civil Service (Medical): No    Lack of Transportation (Non-Medical): No  Physical Activity: Unknown (01/19/2024)   Exercise Vital Sign    Days of Exercise per Week: 0 days    Minutes of Exercise per Session: Not on file  Stress: Stress Concern Present (01/19/2024)   [employer] of Occupational Health - Occupational Stress Questionnaire    Feeling of Stress : Rather much  Social Connections: Socially Integrated (01/19/2024)   Social Connection and Isolation Panel [NHANES]    Frequency of Communication with Friends and Family: More than three times a week    Frequency of Social Gatherings with Friends and Family: Once a week    Attends Religious Services: More than 4 times per year    Active Member of [employer] or Organizations: Yes    Attends Engineer, structural:  More than 4 times per year    Marital Status: Married  Catering manager Violence: Not on file  Objective:  Physical Exam: BP 120/71 (BP Location: Left Arm, Patient Position: Sitting, Cuff Size: Large)   Pulse 80   Temp 97.9 F (36.6 C) (Temporal)   Resp 18   Ht 5\' 5"  (1.651 m)   Wt 149 lb 12.8 oz (67.9 kg)   SpO2 99%   BMI 24.93 kg/m      Physical Exam Constitutional:      Appearance: Normal appearance.  HENT:     Head: Normocephalic and atraumatic.     Right Ear: Hearing, tympanic membrane, ear canal and external ear normal.     Left Ear: Hearing, tympanic membrane, ear canal and external ear normal.     Nose: Nose normal.     Right Sinus: No maxillary sinus tenderness or frontal sinus tenderness.     Left Sinus: No maxillary sinus tenderness or frontal sinus tenderness.     Mouth/Throat:     Mouth: Mucous membranes are moist.     Dentition: Normal dentition.     Pharynx: Oropharynx is clear.     Tonsils: No tonsillar exudate. 0 on the right. 0 on the left.  Eyes:     General: No scleral icterus.       Right eye: No discharge.        Left eye: No discharge.     Extraocular Movements: Extraocular movements intact.  Neck:     Thyroid : No thyroid  mass.  Pulmonary:     Effort: Pulmonary effort is normal.  Musculoskeletal:     Cervical back: Full passive range of motion without pain, normal range of motion and neck supple.  Lymphadenopathy:     Cervical: No cervical adenopathy.  Skin:    General: Skin is warm.     Findings: No rash.  Neurological:     General: No focal deficit present.     Mental Status: She is alert.     Cranial Nerves: No cranial nerve deficit.  Psychiatric:        Mood and Affect: Mood normal.        Behavior: Behavior normal.        Thought Content: Thought content normal.        Judgment: Judgment normal.      Urinalysis Chemical with  Microscopic Review Specimen: Urine Component Ref Range & Units 10 mo ago Comments  Color Colorless, Straw, Light Yellow, Yellow, Dark Yellow Light Yellow   Clarity Clear Clear   Specific Gravity 1.005 - 1.030 1.025   pH, Urine 5.0 - 8.0 5.5   Protein, Urinalysis Negative Negative   Glucose, Urinalysis Negative Negative   Ketones, Urinalysis Negative Negative   Blood, Urinalysis Negative Negative   Nitrite, Urinalysis Negative Negative   Leukocytes, Urinalysis Negative Negative   Bilirubin, Urinalysis Negative Negative   Urobilinogen, Urinalysis 0.2 - 1.0 mg/dL 0.2   Red Blood Cells, Urinalysis <=3 /hpf 1   WBC, UA <=5 /hpf <1   Squamous Epithelial Cells, Urinalysis /hpf 0   Hyaline Casts /lpf 0 Occasional hyaline casts may be found in normal urine.  Resulting Agency [facility]   Narrative Performed by [facility] Reporting of microscopic urinalysis (UA) bacteria and yeast results was discontinued on August 30th, 2021 due to poor clinical predictive value for urinary tract infection (UTI). Providers should use other clinical signs and symptoms to diagnose UTI including urine leukocyte esterase, white blood cell count, and culture in accordance with local and national guidelines. Additional information and rationale can obtained by [facility]  [email or web address]. Specimen Collected: 03/01/23 08:28   Performed by: [facility] Last Resulted: 03/01/23 10:11  Received From: [facility]  Result Received: 03/13/23 17:05   View Encounter   Recent Data from [facility] Related to Urinalysis Chemical with Microscopic Review 01/17/17 06/12/18 12/18/19 12/30/20 01/05/22 03/01/23 Component  Yellow Yellow Yellow Light Yellow Yellow Light Yellow Color  Clear Clear Clear Clear Clear Clear Clarity  1.016 1.024 1.022 1.021 1.029 1.025 Specific Gravity  7.0 7.0 5.5 5.5 5.5 5.5 pH,  Urine  Negative Trace Abnormal  Negative Negative Trace Abnormal  Negative Protein, Urinalysis  Negative Negative Negative Negative Negative Negative Glucose, Urinalysis  Negative Negative Negative Negative Negative Negative Ketones, Urinalysis  Negative Negative Negative Negative Negative Negative Blood, Urinalysis  Negative Negative Negative Negative Negative Negative Nitrite, Urinalysis  Negative Negative Negative Negative Negative Negative Leukocytes, Urinalysis  Negative Negative Negative Negative Negative Negative Bilirubin, Urinalysis  0.2 0.2 0.2 0.2 0.2 0.2 Urobilinogen, Urinalysis  1 1 2 2 2 1  Red Blood Cells, Urinalysis  1 1 2 2 1  <1 WBC, UA  0 1 0 <1 1 0 Squamous Epithelial Cells, Urinalysis  2 2 2 6   0  0  Hyaline Casts   Albumin/Creatinine Ratio, Random Urine Specimen: Urine Component Ref Range & Units 10 mo ago Comments  Albumin/Creatinine Ratio <30.0 mg/g 7.1 Interpretive Data:  Albuminuria Categories (mg/g)  A1 <30 Normal to mildly increased A2 30-300 Moderately increased A3 >300 Severe increased  Albumin, Urine mg/L 8.9 Interpretive Data        No urine albumin reference range reported. The American Diabetes Association recommends the use of the ratio as the preferred means of screening for diabetic nephropathy.      Resulting Agency [facility]   Specimen Collected: 03/01/23 08:28   Performed by: [facility] Last Resulted: 03/01/23 11:02  Received From: [facility]  Result Received: 03/01/23 13:24   View Encounter   Recent Data from [facility] Related to Albumin/Creatinine Ratio, Random Urine 06/12/18 12/30/20 01/05/22 03/01/23 Component  91.3  11.9  23.9  7.1  Albumin/Creatinine Ratio  76.5  14.1  36.3  8.9  Albumin, Urine    Contains abnormal data Comprehensive Metabolic Panel (CMP) Specimen: Blood Component Ref Range & Units 10 mo ago Comments  Sodium 135 - 145 mmol/L 142    Potassium 3.5 - 5.0 mmol/L 3.8   Chloride 98 - 108 mmol/L 106   Carbon Dioxide (CO2) 21 - 30 mmol/L 24   Urea Nitrogen (BUN) 7 - 20 mg/dL 24 High    Creatinine 0.4 - 1.0 mg/dL 1.0   Glucose 70 - 829 mg/dL 562 Interpretive Data: Above is the NONFASTING reference range.  Below are the FASTING reference ranges: NORMAL:      70-99 mg/dL PREDIABETES: 130-865 mg/dL DIABETES:    > 784 mg/dL  Calcium 8.7 - 69.6 mg/dL 9.7   AST (Aspartate Aminotransferase) 15 - 41 U/L 26   ALT (Alanine Aminotransferase) 10 - 39 U/L 53 High    Bilirubin, Total 0.4 - 1.5 mg/dL 0.6   Alk Phos (Alkaline Phosphatase) 24 - 110 U/L 53   Albumin 3.5 - 4.8 g/dL 4.4   Protein, Total 6.2 - 8.1 g/dL 6.2   Anion Gap 3 - 12 mmol/L 12   BUN/CREA Ratio 6 - 27 24   Glomerular Filtration Rate (eGFR) mL/min/1.73sq m 59 CKD-EPI (2021) does not include patient's race in the calculation  of eGFR. Monitoring changes of plasma creatinine and eGFR over time is useful for monitoring kidney function.  This change was made on 11/03/2020.  Interpretive Ranges for eGFR(CKD-EPI 2021):  eGFR:              > 60 mL/min/1.73 sq m - Normal eGFR:              30 - 59 mL/min/1.73 sq m - Moderately Decreased eGFR:              15 - 29 mL/min/1.73 sq m - Severely Decreased eGFR:              < 15 mL/min/1.73 sq m -  Kidney Failure   Note: These eGFR calculations do not apply in acute situations when eGFR is changing rapidly or in patients on dialysis.  Resulting Agency [facility]   Specimen Collected: 03/01/23 08:28   Performed by: [facility] Last Resulted: 03/01/23 10:16  Received From: [facility]  Result Received: 05/17/23 12:00   View Encounter   Recent Data from [facility] Related to Comprehensive Metabolic Panel (CMP) 01/17/17 40/98/11 12/18/19 12/30/20 01/05/22 03/01/23 Component  141 139 141 143 141 142 Sodium  3.3 Low  3.9 3.9 3.5 3.2  Low  3.8 Potassium  105 103 105 106 105 106 Chloride  29 25 26 29 28 24  Carbon Dioxide (CO2)  17 35 High  13 16 25  High  24 High  Urea Nitrogen (BUN)  0.8 1.0 0.7 0.8 0.8 1.0 Creatinine  109  147 High   103  98  104  101  Glucose  9.9 9.6 9.2 9.5 9.3 9.7 Calcium  21 15 22 21 18 26  AST (Aspartate Aminotransferase)  30 26 28  38 26 53 High  ALT (Alanine Aminotransferase)  0.5 0.9 0.8 0.6 0.7 0.6 Bilirubin, Total  42 42 45 44 42 53 Alk Phos (Alkaline Phosphatase)  4.4 4.5 4.1 4.2 4.2 4.4 Albumin  6.6 7.0 6.1 Low  6.4 6.5 6.2 Protein, Total  7 11 10 8 8 12  Anion Gap  21 35 High  19 20 31  High  24 BUN/CREA Ratio  >60  57  87  78  77  59  Glomerular Filtration Rate (eGFR)   Complete Blood Count (CBC) with Differential Specimen: Blood Component Ref Range & Units 10 mo ago  WBC (White Blood Cell Count) 3.2 - 9.8 x10^9/L 5.0  Hemoglobin 11.7 - 15.5 g/dL 91.4  Hematocrit 78.2 - 45.0 % 42.9  Platelets 150 - 450 x10^9/L 238  MCV (Mean Corpuscular Volume) 80 - 98 fL 96  MCH (Mean Corpuscular Hemoglobin) 26.5 - 34.0 pg 31.5  MCHC (Mean Corpuscular Hemoglobin Concentration) 31.0 - 36.0 % 32.9  RBC (Red Blood Cell Count) 3.77 - 5.16 x10^12/L 4.48  RDW-CV (Red Cell Distribution Width) 11.5 - 14.5 % 11.9  NRBC (Nucleated Red Blood Cell Count) 0 x10^9/L 0.00  NRBC % (Nucleated Red Blood Cell %) % 0.0  MPV (Mean Platelet Volume) 7.2 - 11.7 fL 10.1  Neutrophil Count 2.0 - 8.6 x10^9/L 2.8  Neutrophil % 37 - 80 % 55.6  Lymphocyte Count 0.6 - 4.2 x10^9/L 1.5  Lymphocyte % 10 - 50 % 29.6  Monocyte Count 0 - 0.9 x10^9/L 0.4  Monocyte % 0 - 12 % 8.8  Eosinophil Count 0 - 0.70 x10^9/L 0.23  Eosinophil % 0 - 7 % 4.6  Basophil Count 0 - 0.20 x10^9/L 0.06  Basophil %  0 - 2 % 1.2  Immature Granulocyte Count <=0.06 x10^9/L 0.01  Immature Granulocyte % <=0.7 % 0.2  Resulting Agency [facility]  Specimen Collected: 03/01/23 08:28   Performed by: [facility] Last Resulted: 03/01/23 09:50  Received From: [facility]  Result Received: 03/13/23 17:05   View Encounter   Recent Data from [facility] Related to Complete Blood Count (CBC) with Differential 01/17/17 06/12/18 12/18/19 12/30/20 01/05/22 03/01/23 Component  4.9 10.3 High  5.1 5.7 6.1 5.0 WBC (White Blood Cell Count)  14.2 14.0 14.4 14.9 14.2 14.1 Hemoglobin  42.4 41.6 44.1 44.9 42.6 42.9 Hematocrit  217 334 225 239 273 238 Platelets  95 94 95 94 97 96 MCV (Mean Corpuscular Volume)  31.7 31.5 31.0 31.0 32.2 31.5 MCH (Mean Corpuscular Hemoglobin)  33.5 33.7 32.7 33.2 33.3 32.9 MCHC (Mean Corpuscular Hemoglobin Concentration)  4.48 4.44 4.65 4.80 4.41 4.48 RBC (Red Blood Cell Count)  12.6 12.6 11.6 11.9 12.5 11.9 RDW-CV (Red Cell Distribution Width)  0.00 0.00 0.00 0.00 0.00 0.00 NRBC (Nucleated Red Blood Cell Count)  0.0 0.0 0.0 0.0 0.0 0.0 NRBC % (Nucleated Red Blood Cell %)  10.3 10.4 10.1 10.6 9.4 10.1 MPV (Mean Platelet Volume)  2.9 8.2 3.4 3.6 4.3 2.8 Neutrophil Count  59.6 80.1 High  66.4 63.6 70.7 55.6 Neutrophil %  1.4 1.1 1.1 1.3 1.1 1.5 Lymphocyte Count  27.8 10.8 21.2 21.8 18.5 29.6 Lymphocyte %  0.4 0.7 0.4 0.5 0.5 0.4 Monocyte Count  8.1 6.6 8.1 8.9 8.2 8.8 Monocyte %  0.16 0.00 0.16 0.26 0.09 0.23 Eosinophil Count  3.3 0.0 3.1 4.5 1.5 4.6 Eosinophil %  0.04 0.03 0.05 0.06 0.05 0.06 Basophil Count  0.8 0.3 1.0 1.0 0.8 1.2 Basophil %  0.02 0.23 High  0.01 0.01 0.02 0.01 Immature Granulocyte Count  0.4 2.2 High  0.2 0.2 0.3 0.2 Immature Granulocyte %   Lipid Panel W/Calculated Low Density Lipoprotein (LDL) Cholesterol Specimen: Blood Component Ref Range & Units 10 mo ago Comments  Cholesterol, Total mg/dL 865 The significance of total cholesterol depends on the values of individual components including HDL, LDL, non-HDL, and triglycerides.  LDL Calculated <190 mg/dL 92  <78 mg/dL      Desired target for prior  heart disease, stroke, and those at high-risk. Even lower levels may be recommended to decrease risk of heart attack and stroke. 70-159 mg/dL   Comprehensive cardiovascular risk assessment is recommended. Statin therapy may be advised based on risk factors. 160-189 mg/dL  Moderately elevated LDL level. Statin therapy recommended if other risk factors present. >=190 mg/dL    Severely elevated LDL level. High long-term risk of heart disease and stroke. High-intensity statin therapy recommended for most people. Consider specialist referral.  *A healthy diet and exercise are recommended for all to reduce heart disease risk. Statin choice should be based on patient preference after patient-provider discussions.   Ref: 2018 ACC/AHA Guideline  HDL mg/dL 42  People with low HDL levels (see below) are at increased risk of heart disease: <50 mg/dL for Women <46 mg/dL for Men  Triglyceride <962 mg/dL 952  <841 mg/dL      Normal 324-401 mg/dL   High Triglycerides. Risk of heart disease may be increased. Address reversible causes (eg sugar in foods and beverages, alcohol, and diabetes control). Medication may be appropriate based on other clinical factors. >=500 mg/dL     Very High Triglycerides. Risk of heart disease  and pancreatitis increased. Address reversible causes as above. Medication to lower triglycerides usually advised.   *Ranges provided for adults, pediatric guidelines vary.  Resulting Agency [facility]   Specimen Collected: 03/01/23 08:28   Performed by: [facility] Last Resulted: 03/01/23 10:16  Received From: [facility]  Result Received: 03/01/23 13:24   View Encounter   Recent Data from [facility] Related to Lipid Panel W/Calculated Low Density Lipoprotein (LDL) Cholesterol 01/17/17 28/41/32 12/18/19 12/30/20 01/05/22 03/01/23 Component  140  183  156  153  168  187  Cholesterol, Total  68  104  82  78  96   92  LDL Calculated  50  54  43  41  52  42  HDL  110  125  154  168  98  266  Triglyceride    Contains abnormal data Hemoglobin A1C Specimen: Blood Component Ref Range & Units 10 mo ago  Hemoglobin A1C <5.7 % 5.8 High   Average Blood Glucose (Calculated From HgBA1c Level) mg/dL 440  Resulting Agency [facility]  Narrative Performed by [facility] Between 5.7% and 6.4% is suggestive of Pre-Diabetes or controlled Diabetes. Greater than or equal to 6.5% is suggestive of Diabetes, and if more than one value, diagnostic.  Accuracy may be reduced by anemia, hemoglobinopathy, recent transfusion, sickle cell, artificial heart valve, dialysis, TIPS, severe hyperglycemia, etc. Specimen Collected: 03/01/23 08:28   Performed by: [facility] Last Resulted: 03/01/23 11:04  Received From: [facility]  Result Received: 03/13/23 17:05   View Encounter   Recent Data from [facility] Related to Hemoglobin A1C 06/12/18 12/18/19 12/30/20 01/05/22 03/01/23 Component  5.9 5.6 5.9 5.5 5.8 High  Hemoglobin A1C  121 111 121 108 118 Average Blood Glucose (Calculated From HgBA1c Level)   25 OH Vitamin D  Specimen: Blood Component Ref Range & Units 10 mo ago  Vitamin D  Total, 25OH 30 - 100 ng/ml 41  Resulting Agency [facility]  Narrative Performed by [facility] Bone and Mineral Metabolism:  Vitamin D    25 OH Vitamin D  Status                                                                                 <10 ng/mL        Deficiency                                           10-30 ng/mL      Insufficiency                                       30-100 ng/mL     Sufficiency                                         >  100 ng/mL       Toxicity                                             Specimen Collected: 03/01/23 08:28   Performed by: [facility]  Last Resulted: 03/01/23 10:35  Received From: [facility]  Result Received: 03/13/23 17:05   View Encounter   Recent Data from [facility] Related to 25 [facility] Vitamin D  01/17/17 06/12/18 12/18/19 03/01/23 Component  61 49 61 41 Vitamin D  Total, 25OH   Thyroid  Stimulating Hormone (TSH) Specimen: Blood Component Ref Range & Units 10 mo ago  Thyroid  Stimulating Hormone (TSH) 0.34 - 5.66 IU/mL 2.02  Resulting Agency [facility]  Specimen Collected: 03/01/23 08:28   Performed by: [facility] Last Resulted: 03/01/23 10:24  Received From: [facility]  Result Received: 03/13/23 17:05   View Encounter   Recent Data from [facility] Related to Thyroid  Stimulating Hormone (TSH) 01/17/17 03/01/23 Component  2.22 2.02 Thyroid  Stimulating Hormone (TSH)    DG Chest 2 View Result Date: 01/05/2024 CLINICAL DATA:  Pneumonia. EXAM: CHEST - 2 VIEW COMPARISON:  X-ray 12/04/2023 FINDINGS: No consolidation, pneumothorax or effusion. No edema. Normal cardiopericardial silhouette. Tortuous ectatic aorta. Mild degenerative changes of the spine. Air-fluid level along the stomach beneath the left hemidiaphragm. IMPRESSION: No acute cardiopulmonary disease. Electronically Signed   By: Adrianna Horde M.D.   On: 01/05/2024 14:28   XR HIP UNILAT W OR W/O PELVIS 2-3 VIEWS LEFT Result Date: 01/05/2024 2 view x-ray of the left hip including AP pelvis and left lateral hip x-ray were ordered and reviewed by myself today.  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  There is no acute fracture or advanced arthritic change.  No spurring of the greater tuberosity.  MR CERVICAL SPINE WO CONTRAST Result Date: 12/14/2023 CLINICAL DATA:  Spinal stenosis of cervical region. Neck pain radiating to the right shoulder and arm. EXAM: MRI CERVICAL SPINE WITHOUT CONTRAST TECHNIQUE: Multiplanar,  multisequence MR imaging of the cervical spine was performed. No intravenous contrast was administered. COMPARISON:  Cervical spine radiographs 01/20/2016 FINDINGS: Alignment: Grade 1 retrolisthesis of C4 on C5. Vertebrae: No fracture, suspicious marrow lesion, or evidence of discitis. Moderate disc space narrowing from C4-5 through C6-7, greatest at C5-6 with predominantly chronic degenerative endplate changes. Cord: Normal signal. Posterior Fossa, vertebral arteries, paraspinal tissues: Unremarkable. Disc levels: C2-3: Mild disc bulging, uncovertebral spurring, and severe right and moderate left facet arthrosis result in mild-to-moderate right neural foraminal stenosis without spinal stenosis. C3-4: Disc bulging, right greater than left uncovertebral spurring, and severe right and mild left facet arthrosis result in severe right and mild-to-moderate left neural foraminal stenosis without spinal stenosis. C4-5: Retrolisthesis, a broad-based posterior disc osteophyte complex, and mild facet arthrosis result in borderline spinal stenosis and severe bilateral neural foraminal stenosis. C5-6: Disc bulging, uncovertebral spurring, and mild facet arthrosis result in mild spinal stenosis and mild-to-moderate right and severe left neural foraminal stenosis. C6-7: Disc bulging and uncovertebral spurring result in borderline to mild spinal stenosis and mild right and moderate left neural foraminal stenosis. C7-T1: Mild-to-moderate facet arthrosis without stenosis. T1-2: Disc bulging and mild to moderate left facet arthrosis result in borderline to mild spinal stenosis and mild right and moderate left neural foraminal stenosis. IMPRESSION:  1. Advanced cervical disc and facet degeneration without high-grade spinal stenosis. 2. Severe neural foraminal stenosis on the right at C3-4, bilaterally at C4-5, and on the left at C5-6. Electronically Signed   By: Aundra Lee M.D.   On: 12/14/2023 10:19   DG Chest 2 View Result Date:  12/04/2023 CLINICAL DATA:  Cough and fever.  Shortness of breath EXAM: CHEST - 2 VIEW COMPARISON:  X-ray 08/05/2015 FINDINGS: Tiny left effusion versus pleural thickening. Parenchymal opacity also seen. Subtle infiltrate is possible. No pneumothorax or edema. Normal cardiopericardial silhouette. Tortuous and ectatic aorta. IMPRESSION: Small left effusion worse both thickening and some adjacent opacity. Infiltrate is possible. Recommend follow-up. Electronically Signed   By: Adrianna Horde M.D.   On: 12/04/2023 11:33    Recent Results (from the past 2160 hours)  POC Covid19/Flu A&B Antigen     Status: None   Collection Time: 11/30/23  9:09 AM  Result Value Ref Range   Influenza A Antigen, POC Negative Negative   Influenza B Antigen, POC Negative Negative   Covid Antigen, POC Negative Negative        Carnell Christian, MD, MS

## 2024-02-08 ENCOUNTER — Ambulatory Visit: Admitting: Family Medicine

## 2024-02-08 ENCOUNTER — Ambulatory Visit: Payer: Self-pay | Admitting: *Deleted

## 2024-02-08 ENCOUNTER — Encounter: Payer: Self-pay | Admitting: Family Medicine

## 2024-02-08 VITALS — BP 122/76 | HR 77 | Temp 98.1°F | Ht 65.0 in | Wt 156.0 lb

## 2024-02-08 DIAGNOSIS — H6991 Unspecified Eustachian tube disorder, right ear: Secondary | ICD-10-CM

## 2024-02-08 DIAGNOSIS — J22 Unspecified acute lower respiratory infection: Secondary | ICD-10-CM | POA: Diagnosis not present

## 2024-02-08 MED ORDER — PREDNISONE 10 MG PO TABS: 10.0000 mg | ORAL_TABLET | Freq: Two times a day (BID) | ORAL | 0 refills | Status: AC

## 2024-02-08 MED ORDER — AMOXICILLIN-POT CLAVULANATE 875-125 MG PO TABS: 1.0000 | ORAL_TABLET | Freq: Two times a day (BID) | ORAL | 0 refills | Status: DC

## 2024-02-08 NOTE — Telephone Encounter (Signed)
  Reason for Disposition  [1] Longstanding difficulty breathing (e.g., CHF, COPD, emphysema) AND [2] WORSE than normal  Answer Assessment - Initial Assessment Questions 1. RESPIRATORY STATUS: "Describe your breathing?" (e.g., wheezing, shortness of breath, unable to speak, severe coughing)      Cough with deep breath, wheezing slight- rattling in chest 2. ONSET: "When did this breathing problem begin?"      Yesterday- recent bronchitis and pneumonia  3. PATTERN "Does the difficult breathing come and go, or has it been constant since it started?"     Comes and goes 4. SEVERITY: "How bad is your breathing?" (e.g., mild, moderate, severe)    - MILD: No SOB at rest, mild SOB with walking, speaks normally in sentences, can lie down, no retractions, pulse < 100.    - MODERATE: SOB at rest, SOB with minimal exertion and prefers to sit, cannot lie down flat, speaks in phrases, mild retractions, audible wheezing, pulse 100-120.    - SEVERE: Very SOB at rest, speaks in single words, struggling to breathe, sitting hunched forward, retractions, pulse > 120      no 5. RECURRENT SYMPTOM: "Have you had difficulty breathing before?" If Yes, ask: "When was the last time?" and "What happened that time?"      See above  8. CAUSE: "What do you think is causing the breathing problem?"      cough 9. OTHER SYMPTOMS: "Do you have any other symptoms? (e.g., dizziness, runny nose, cough, chest pain, fever)     Cough,nasal congestion  Protocols used: Breathing Difficulty-A-AH      Copied from CRM [id number]. Topic: Clinical - Red Word Triage >> Feb 08, 2024  8:11 AM Juliana Ocean wrote: Red Word that prompted transfer to Nurse Triage: wheezing, rattling In chest, had PNA recently Reason for Disposition  [1] Longstanding difficulty breathing (e.g., CHF, COPD, emphysema) AND [2] WORSE than normal

## 2024-02-08 NOTE — Progress Notes (Addendum)
 78 y.o. G30P1102 Married Caucasian female here for annual exam.  Has noticed abnormal bumps around rectum she would like looked at, first noticed a few days ago.  No pain or drainage.  States she has had a growth outside the anus for years and that it has not changed.  Taking Augmentin  and prednisone  for a respiratory infection.   Doing injections for back issues.   Married fro 57 years.  2 children, 6 grandchildren.   PCP: Catheryn Cluck, MD   No LMP recorded. Patient is postmenopausal.           Sexually active: No.  The current method of family planning is post menopausal status.    Menopausal hormone therapy:  n/a Exercising: No.    Smoker:  Former  OB History     Gravida  2   Para  2   Term  1   Preterm  1   AB  0   Living  2      SAB  0   IAB      Ectopic  0   Multiple      Live Births              HEALTH MAINTENANCE: Last 2 paps: 09/28/21 neg, 05/03/18 neg History of abnormal Pap or positive HPV:  no Mammogram:  08/09/23 Breast Density Cat C, BIRADS Cat 1 neg  Colonoscopy:  Has Cologuard at home.  Has through [facility]. Bone Density:  01/05/22(Care everywhere)  Result  normal   Immunization History  Administered Date(s) Administered   Fluad Quad(high Dose 65+) 05/17/2019, 06/30/2020   Influenza Split 07/27/2012   Influenza Whole 06/05/2008, 09/30/2009   Influenza,inj,Quad PF,6+ Mos 07/17/2014, 05/01/2018   Influenza-Unspecified 06/02/2015, 06/05/2016, 06/05/2017   Moderna Covid-19 Vaccine Bivalent Booster 26yrs & up 08/17/2021, 04/03/2022   Moderna Sars-Covid-2 Vaccination 10/06/2019, 11/03/2019, 07/20/2020   Pneumococcal Conjugate-13 07/17/2014   Pneumococcal Polysaccharide-23 07/27/2012, 05/01/2018   Tdap 09/06/2007, 03/21/2008, 09/05/2014   Zoster Recombinant(Shingrix) 03/30/2020, 06/30/2020   Zoster, Live 06/05/2008      reports that she has quit smoking. She has never used smokeless tobacco. She reports current alcohol use. She  reports that she does not use drugs.  Past Medical History:  Diagnosis Date   Anxiety    Anxiety disorder    Concussion Dec 13th, 2011   fell, striking her head against a wall, developed dizziness - UC eval c/w concussion. Had follow-up and did ok.    History of chest pain    Hyperlipidemia    Hypertension    Plantar fasciitis    Renal stone    Spinal stenosis, lumbar     Past Surgical History:  Procedure Laterality Date   ARTHRODESIS METATARSALPHALANGEAL JOINT (MTPJ) Right 10/08/2015   Procedure: RIGHT HALLUX METATARSAL PHALANGEAL JOINT  ARTHRODESIS;  Surgeon: Amada Backer, MD;  Location: [facility];  Service: Orthopedics;  Laterality: Right;   caesarean section     CESAREAN SECTION     HAMMERTOE RECONSTRUCTION WITH WEIL OSTEOTOMY Right 10/08/2015   Procedure: RIGHT SECOND METATARSAL WEIL HAMMERTOE CORRECTION;  Surgeon: Amada Backer, MD;  Location:  SURGERY CENTER;  Service: Orthopedics;  Laterality: Right;   TONSILLECTOMY      Current Outpatient Medications  Medication Sig Dispense Refill   amoxicillin -clavulanate (AUGMENTIN ) 875-125 MG tablet Take 1 tablet by mouth 2 (two) times daily. 20 tablet 0   celecoxib  (CELEBREX ) 100 MG capsule Take 100 mg by mouth 2 (two) times daily.  clobetasol (TEMOVATE) 0.05 % external solution Apply topically daily.     co-enzyme Q-10 30 MG capsule Take 30 mg by mouth 3 (three) times daily. Pt is taking 200MG      irbesartan-hydrochlorothiazide (AVALIDE) 150-12.5 MG tablet Take 1 tablet by mouth daily.     omeprazole  (PRILOSEC) 20 MG capsule Take 1 capsule (20 mg total) by mouth daily. 90 capsule 3   predniSONE  (DELTASONE ) 10 MG tablet Take 1 tablet (10 mg total) by mouth 2 (two) times daily with a meal for 7 days. 14 tablet 0   rosuvastatin (CRESTOR) 5 MG tablet Take 5 mg by mouth daily.   3   No current facility-administered medications for this visit.    ALLERGIES: Qc tumeric complex [turmeric] and Omnicef   [cefdinir ]  Family History  Problem Relation Age of Onset   Cancer Mother    Coronary artery disease Father     Review of Systems  All other systems reviewed and are negative.   PHYSICAL EXAM:  BP 134/86 (BP Location: Left Arm, Patient Position: Sitting)   Pulse 80   Ht 5\' 5"  (1.651 m)   Wt 154 lb (69.9 kg)   SpO2 98%   BMI 25.63 kg/m     General appearance: alert, cooperative and appears stated age Head: normocephalic, without obvious abnormality, atraumatic Neck: no adenopathy, supple, symmetrical, trachea midline and thyroid  normal to inspection and palpation Lungs: rales and rhonchi of bilateral lower lobes. Breasts: normal appearance, no masses or tenderness, No nipple retraction or dimpling, No nipple discharge or bleeding, No axillary adenopathy Heart: regular rate and rhythm Abdomen: soft, non-tender; no masses, no organomegaly Extremities: extremities normal, atraumatic, no cyanosis or edema Skin: skin color, texture, turgor normal. No rashes or lesions Lymph nodes: cervical, supraclavicular, and axillary nodes normal. Neurologic: grossly normal  Pelvic: External genitalia:  no lesions              No abnormal inguinal nodes palpated.              Urethra:  normal appearing urethra with no masses, tenderness or lesions              Bartholins and Skenes: normal                 Vagina: normal appearing vagina with normal color and discharge, no lesions              Cervix: no lesions              Pap taken: no Bimanual Exam:  Uterus:  normal size, contour, position, consistency, mobility, non-tender              Adnexa: no mass, fullness, tenderness              Rectal exam: yes.  Confirms.              Anus:  normal sphincter tone, 3 mm skin tag in perianal region at 7 - 8:00.  Small probable hemorrhoid palpable noted inside the anal opening.  Chaperone was present for exam:  Cottie Diss, CMA  ASSESSMENT: Well woman visit with GYN exam.  Perianal skin tag.    PLAN: Mammogram screening discussed. Self breast awareness reviewed. Pap and HRV collected:  no.  Due in 2026.  Guidelines for Calcium, Vitamin D , regular exercise program including cardiovascular and weight bearing exercise. Medication refills:  NA Labs with PCP.  She will complete her Cologuard through [facility].  Follow up:  yearly and  prn.

## 2024-02-08 NOTE — Telephone Encounter (Signed)
 FYI Only or Action Required?: FYI only for provider  Patient was last seen in primary care on 01/23/2024 by Catheryn Cluck, MD. Called Nurse Triage reporting Wheezing. Symptoms began yesterday. Interventions attempted: Nothing. Symptoms are: gradually worsening.  Triage Disposition: See HCP Within 4 Hours (Or PCP Triage)  Patient/caregiver understands and will follow disposition?: yes

## 2024-02-08 NOTE — Progress Notes (Signed)
 Established Patient Office Visit   Subjective:  Patient ID: Kimberly Cox, female    DOB: 1946/06/12  Age: 78 y.o. MRN: [id number]  Chief Complaint  Patient presents with   Cough    Cough x 2 days. Pt states she hears crackling noise in her chest. Pt states she had Bronchcits and pneumonis 2 months ago. No NV, fever or chill.     Cough Pertinent negatives include no ear pain, shortness of breath or wheezing.   Encounter Diagnoses  Name Primary?   Lower resp. tract infection Yes   Dysfunction of right eustachian tube    Presents with a 2-day history of a loose cough associated with nasal congestion and postnasal drip.  She is unable to produce sputum.  There has been no fever.  Denies wheezing or difficulty breathing.  Status post recent URI just this past month.  She is uncertain as to whether or not her symptoms entirely resolved.  Ongoing right ear congestion.  Treated with Zithromax  for its anti-inflammatory properties.  Unfortunately this did not help.  She has had occasional sneeze.  Denies itchy watery eyes ears nose or throat.  She has been taking Zyrtec and a nasal steroid on a continual basis.   Review of Systems  HENT:  Positive for congestion and hearing loss. Negative for ear pain.   Respiratory:  Positive for cough. Negative for sputum production, shortness of breath and wheezing.      Current Outpatient Medications:    amoxicillin -clavulanate (AUGMENTIN) 875-125 MG tablet, Take 1 tablet by mouth 2 (two) times daily., Disp: 20 tablet, Rfl: 0   celecoxib  (CELEBREX ) 100 MG capsule, Take 100 mg by mouth 2 (two) times daily., Disp: , Rfl:    clobetasol (TEMOVATE) 0.05 % external solution, Apply topically daily., Disp: , Rfl:    co-enzyme Q-10 30 MG capsule, Take 30 mg by mouth 3 (three) times daily. Pt is taking 200MG , Disp: , Rfl:    irbesartan-hydrochlorothiazide (AVALIDE) 150-12.5 MG tablet, Take 1 tablet by mouth daily., Disp: , Rfl:    omeprazole  (PRILOSEC) 20 MG  capsule, Take 1 capsule (20 mg total) by mouth daily., Disp: 90 capsule, Rfl: 3   predniSONE  (DELTASONE ) 10 MG tablet, Take 1 tablet (10 mg total) by mouth 2 (two) times daily with a meal for 7 days., Disp: 14 tablet, Rfl: 0   rosuvastatin (CRESTOR) 5 MG tablet, Take 5 mg by mouth daily. , Disp: , Rfl: 3   Objective:     BP 122/76 (Cuff Size: Normal)   Pulse 77   Temp 98.1 F (36.7 C) (Oral)   Ht 5\' 5"  (1.651 m)   Wt 156 lb (70.8 kg)   SpO2 97%   BMI 25.96 kg/m    Physical Exam Constitutional:      General: She is not in acute distress.    Appearance: Normal appearance. She is not ill-appearing, toxic-appearing or diaphoretic.  HENT:     Head: Normocephalic and atraumatic.     Right Ear: Ear canal and external ear normal. No middle ear effusion. Tympanic membrane is retracted. Tympanic membrane is not erythematous.     Left Ear: Tympanic membrane, ear canal and external ear normal.     Mouth/Throat:     Mouth: Mucous membranes are moist.     Pharynx: Oropharynx is clear. No oropharyngeal exudate or posterior oropharyngeal erythema.  Eyes:     General: No scleral icterus.       Right eye: No discharge.  Left eye: No discharge.     Extraocular Movements: Extraocular movements intact.     Conjunctiva/sclera: Conjunctivae normal.     Pupils: Pupils are equal, round, and reactive to light.  Cardiovascular:     Rate and Rhythm: Normal rate and regular rhythm.  Pulmonary:     Effort: Pulmonary effort is normal. No respiratory distress.     Breath sounds: No decreased breath sounds or wheezing.  Musculoskeletal:     Cervical back: No rigidity or tenderness.  Skin:    General: Skin is warm and dry.  Neurological:     Mental Status: She is alert and oriented to person, place, and time.  Psychiatric:        Mood and Affect: Mood normal.        Behavior: Behavior normal.      No results found for any visits on 02/08/24.    The 10-year ASCVD risk score (Arnett DK,  et al., 2019) is: 21%    Assessment & Plan:   Lower resp. tract infection -     Amoxicillin -Pot Clavulanate; Take 1 tablet by mouth 2 (two) times daily.  Dispense: 20 tablet; Refill: 0  Dysfunction of right eustachian tube -     predniSONE ; Take 1 tablet (10 mg total) by mouth 2 (two) times daily with a meal for 7 days.  Dispense: 14 tablet; Refill: 0    Return Schedule follow-up with Dr. Hildy Lowers in a few weeks.    Tonna Frederic, MD

## 2024-02-12 ENCOUNTER — Ambulatory Visit (INDEPENDENT_AMBULATORY_CARE_PROVIDER_SITE_OTHER): Payer: BC Managed Care – PPO | Admitting: Obstetrics and Gynecology

## 2024-02-12 ENCOUNTER — Ambulatory Visit (INDEPENDENT_AMBULATORY_CARE_PROVIDER_SITE_OTHER): Admitting: Sports Medicine

## 2024-02-12 ENCOUNTER — Other Ambulatory Visit: Payer: Self-pay

## 2024-02-12 ENCOUNTER — Encounter: Payer: Self-pay | Admitting: Obstetrics and Gynecology

## 2024-02-12 ENCOUNTER — Encounter: Payer: Self-pay | Admitting: Sports Medicine

## 2024-02-12 VITALS — BP 134/86 | HR 80 | Ht 65.0 in | Wt 154.0 lb

## 2024-02-12 DIAGNOSIS — M67959 Unspecified disorder of synovium and tendon, unspecified thigh: Secondary | ICD-10-CM

## 2024-02-12 DIAGNOSIS — R29898 Other symptoms and signs involving the musculoskeletal system: Secondary | ICD-10-CM | POA: Diagnosis not present

## 2024-02-12 DIAGNOSIS — M7062 Trochanteric bursitis, left hip: Secondary | ICD-10-CM | POA: Diagnosis not present

## 2024-02-12 DIAGNOSIS — Z01419 Encounter for gynecological examination (general) (routine) without abnormal findings: Secondary | ICD-10-CM | POA: Diagnosis not present

## 2024-02-12 MED ORDER — LIDOCAINE HCL 1 % IJ SOLN
2.0000 mL | INTRAMUSCULAR | Status: AC | PRN
  Administered 2024-02-12: 2 mL

## 2024-02-12 MED ORDER — BETAMETHASONE SOD PHOS & ACET 6 (3-3) MG/ML IJ SUSP
6.0000 mg | INTRAMUSCULAR | Status: AC | PRN
  Administered 2024-02-12: 6 mg via INTRA_ARTICULAR

## 2024-02-12 MED ORDER — BUPIVACAINE HCL 0.25 % IJ SOLN
2.0000 mL | INTRAMUSCULAR | Status: AC | PRN
  Administered 2024-02-12: 2 mL via INTRA_ARTICULAR

## 2024-02-12 NOTE — Progress Notes (Signed)
 Patient says that she did well for a couple of weeks, but her hip pain has returned. She says that they were traveling and she spent a lot of time in the car. She has not been doing her home exercises due to her travel, and prior to that was doing them twice a week. She is having pain with walking, and at rest feels okay.

## 2024-02-12 NOTE — Patient Instructions (Signed)

## 2024-02-12 NOTE — Progress Notes (Signed)
 Kimberly Cox - 78 y.o. female MRN [id number]  Date of birth: 1946-04-29  Office Visit Note: Visit Date: 02/12/2024 PCP: Catheryn Cluck, MD Referred by: Arcadio Knuckles, MD  Subjective: Chief Complaint  Patient presents with   Left Hip - Follow-up   HPI: Kimberly Cox is a pleasant 78 y.o. female who presents today for follow-up of left lateral hip pain.  Back in early May we did perform 1 treatment of extracorporeal shockwave therapy and then a few days later she had a CT-guided lumbar spine injection with good improvement of her back pain and her hip pain.  She does admit she has not been consistent with her home exercises, and over the last 1-2 weeks her pain has started to return.  She has been doing a lot of traveling and spending time in the car which has exacerbated her pain.  Continues over the left side of the lateral hip.  Given this, she has restarted her Celebrex  100 mg twice daily with only mild relief.  Pertinent ROS were reviewed with the patient and found to be negative unless otherwise specified above in HPI.   Assessment & Plan: Visit Diagnoses:  1. Trochanteric bursitis, left hip   2. Tendinopathy of gluteal region   3. Weakness of left hip    Plan: Impression is acute exacerbation of chronic left lateral hip pain which does have evidence of both trochanteric bursitis on bedside ultrasound as well as tendinopathy and associated weakness of the gluteus minimus and medius as evidenced on previous MRI.  Previous MRI does not show high-grade tearing, so I do believe that her pain is causing associated muscle inhibition and her weakness.  Through shared decision making, we did proceed with ultrasound-guided greater trochanteric injection, patient tolerated well.  Advised on postinjection protocol.  She may continue her Celebrex  100 mg once to twice daily over the next few days, hopefully as her pain subsides from the injection she may discontinue this or at least  transition to only as needed.  Starting on Thursday, she will get started back on her home hip stabilization exercises, remain consistent with these at least once daily.  I would like to see her back in 1 month for reevaluation.  Depending on her degree of improvement, we could consider shockwave treatment, nitroglycerin patch protocol or even PRP injection down the road.  Follow-up: Return in about 1 month (around 03/13/2024) for L-lateral hip.   Meds & Orders: No orders of the defined types were placed in this encounter.   Orders Placed This Encounter  Procedures   Large Joint Inj: L greater trochanter   US  Guided Needle Placement - No Linked Charges     Procedures: Large Joint Inj: L greater trochanter on 02/12/2024 2:13 PM Indications: pain Details: 22 G 3.5 in needle, ultrasound-guided lateral approach Medications: 2 mL lidocaine  1 %; 2 mL bupivacaine  0.25 %; 6 mg betamethasone  acetate-betamethasone  sodium phosphate  6 (3-3) MG/ML Outcome: tolerated well, no immediate complications  US -Guided Greater Trochanteric Bursa Injection, Left After discussion on risks/benefits/indications and informed verbal consent was obtained, a timeout was performed. The patient was lying in lateral recumbent position on exam table. Using ultrasound guidance, the greater trochanter was identified. The area overlying the trochanteric bursa was then prepped with Betadine and alcohol swabs. Following sterile precautions, ultrasound was reapplied to visualize needle guidance with a 22-gauge 3.5" needle utilizing an in-plane approach to inject the bursa with 2:2:1 lidocaine :bupivicaine:betamethasone . Delivery of the injectate was visualized into  the region of hypoechoic fluid of the greater trochanteric bursa. Patient tolerated procedure well without immediate complications.    Procedure, treatment alternatives, risks and benefits explained, specific risks discussed. Consent was given by the patient. Immediately prior  to procedure a time out was called to verify the correct patient, procedure, equipment, support staff and site/side marked as required. Patient was prepped and draped in the usual sterile fashion.          Clinical History: No specialty comments available.  She reports that she has quit smoking. She has never used smokeless tobacco. No results for input(s): "HGBA1C", "LABURIC" in the last 8760 hours.  Objective:    Physical Exam  Gen: Well-appearing, in no acute distress; non-toxic CV: Well-perfused. Warm.  Resp: Breathing unlabored on room air; no wheezing. Psych: Fluid speech in conversation; appropriate affect; normal thought process  Ortho Exam - Left hip: + TTP over the greater trochanteric region.  There is mild swelling in this location without warmth or redness.  There is 3/5 strength with resisted hip abduction compared to the contralateral side which does not have pain or weakness.  Negative FADIR test.  Imaging:  - MRI from 02/26/2023 was performed on the right hip, but I did review this today pending attention to the left side which does have some tendinosis and possible mild undersurface interstitial tearing of the left gluteus medius and minimus near the crossover point.  There is no full-thickness tearing nor tendon retraction.  Past Medical/Family/Surgical/Social History: Medications & Allergies reviewed per EMR, new medications updated. Patient Active Problem List   Diagnosis Date Noted   Dysfunction of right eustachian tube 01/23/2024   History of pneumonia 01/23/2024   Diuretic-induced hypokalemia 03/31/2020   Sciatica of right side 05/01/2018   Piriformis syndrome of left side 11/27/2017   GERD with esophagitis 08/22/2017   Asthmatic bronchitis 08/22/2017   Prediabetes 02/02/2017   Pure hyperglyceridemia 02/02/2017   Degenerative cervical disc 01/26/2016   Degenerative lumbar disc 01/26/2016   Spinal stenosis of lumbar region with radiculopathy 01/20/2016    Routine general medical examination at a health care facility 07/18/2014   Hereditary and idiopathic peripheral neuropathy 01/31/2008   Hyperlipidemia LDL goal <130 12/05/2007   Primary hypertension 12/05/2007   Past Medical History:  Diagnosis Date   Anxiety    Anxiety disorder    Concussion Dec 13th, 2011   fell, striking her head against a wall, developed dizziness - UC eval c/w concussion. Had follow-up and did ok.    History of chest pain    Hyperlipidemia    Hypertension    Plantar fasciitis    Renal stone    Spinal stenosis, lumbar    Family History  Problem Relation Age of Onset   Cancer Mother    Coronary artery disease Father    Past Surgical History:  Procedure Laterality Date   ARTHRODESIS METATARSALPHALANGEAL JOINT (MTPJ) Right 10/08/2015   Procedure: RIGHT HALLUX METATARSAL PHALANGEAL JOINT  ARTHRODESIS;  Surgeon: Amada Backer, MD;  Location: [facility];  Service: Orthopedics;  Laterality: Right;   caesarean section     CESAREAN SECTION     HAMMERTOE RECONSTRUCTION WITH WEIL OSTEOTOMY Right 10/08/2015   Procedure: RIGHT SECOND METATARSAL WEIL HAMMERTOE CORRECTION;  Surgeon: Amada Backer, MD;  Location: [facility];  Service: Orthopedics;  Laterality: Right;   TONSILLECTOMY     Social History   Occupational History   Not on file  Tobacco Use   Smoking status: Former  Smokeless tobacco: Never   Tobacco comments:    only in h.s.  Vaping Use   Vaping status: Never Used  Substance and Sexual Activity   Alcohol use: Yes    Comment: occ   Drug use: No   Sexual activity: Not Currently    Birth control/protection: Post-menopausal    Comment: 1st intercourse- 20, partners- 1

## 2024-02-15 ENCOUNTER — Encounter: Payer: Self-pay | Admitting: Sports Medicine

## 2024-02-22 ENCOUNTER — Ambulatory Visit: Admitting: Family Medicine

## 2024-02-22 ENCOUNTER — Encounter: Payer: Self-pay | Admitting: Family Medicine

## 2024-02-22 VITALS — BP 122/80 | HR 72 | Temp 97.6°F | Ht 65.0 in | Wt 153.8 lb

## 2024-02-22 DIAGNOSIS — R058 Other specified cough: Secondary | ICD-10-CM | POA: Insufficient documentation

## 2024-02-22 DIAGNOSIS — H6991 Unspecified Eustachian tube disorder, right ear: Secondary | ICD-10-CM

## 2024-02-22 MED ORDER — BUDESONIDE-FORMOTEROL FUMARATE 160-4.5 MCG/ACT IN AERO: 2.0000 | INHALATION_SPRAY | Freq: Two times a day (BID) | RESPIRATORY_TRACT | 3 refills | Status: DC

## 2024-02-22 NOTE — Progress Notes (Signed)
 Assessment & Plan   Assessment/Plan:   Assessment & Plan Eustachian Tube Dysfunction Chronic eustachian tube dysfunction with persistent congestion and sensation of mucus between throat and nasopharynx since early April, post-pneumonia. Previous treatments with oral and intranasal steroids, antibiotics, and decongestants have been ineffective. No pain or dizziness, but unsteadiness on feet possibly related to ear condition. Examination reveals no significant fluid or exudate in the ear, indicating need for further intervention. - Refer to ENT specialist Dr. Nathen Balder for evaluation and potential tympanostomy tube placement. - Coordinate referral and follow-up with ENT office. - Advise to contact the office if no response from ENT within one week.  Residual Upper Airway Cough Syndrome Persistent cough with postnasal drip following pneumonia. Symptoms have improved with decreased cough and mucus production. Current management includes Xyzal and Flonase, with consideration of reflux management with omeprazole . No dyspnea or chest pain. Recent chest x-ray is unremarkable. Cough likely due to postnasal drip or upper airway cough syndrome rather than reflux. - Continue Xyzal and Flonase. - Consider increasing omeprazole  for reflux management. - Monitor symptoms and consider albuterol  inhaler if pulmonary symptoms persist.      Medications Discontinued During This Encounter  Medication Reason   amoxicillin -clavulanate (AUGMENTIN ) 875-125 MG tablet     Return if symptoms worsen or fail to improve.        Subjective:   Encounter date: 02/22/2024  Kimberly Cox is a 78 y.o. female who has Hyperlipidemia LDL goal <130; Hereditary and idiopathic peripheral neuropathy; Primary hypertension; Routine general medical examination at a health care facility; Spinal stenosis of lumbar region with radiculopathy; Degenerative cervical disc; Degenerative lumbar disc; Prediabetes; Pure  hyperglyceridemia; GERD with esophagitis; Asthmatic bronchitis; Piriformis syndrome of left side; Sciatica of right side; Diuretic-induced hypokalemia; Dysfunction of right eustachian tube; History of pneumonia; and Upper airway cough syndrome on their problem list..   She  has a past medical history of Anxiety, Anxiety disorder, Concussion (Dec 13th, 2011), History of chest pain, Hyperlipidemia, Hypertension, Plantar fasciitis, Renal stone, and Spinal stenosis, lumbar..   She presents with chief complaint of Follow-up .   Discussed the use of AI scribe software for clinical note transcription with the patient, who gave verbal consent to proceed.  History of Present Illness Kimberly Cox is a 78 year old female who presents for follow-up after treatment for eustachian tube dysfunction.  She has persistent eustachian tube dysfunction despite treatment with oral and intranasal steroids, as well as antibiotics including Augmentin . She experiences ongoing ear congestion and a sensation of mucus stuck between her throat and nose, which does not clear easily. Her voice sounds 'weird' to herself when speaking. No ear pain or dizziness, but she feels unsteady on her feet, which she wonders might be related to her ear issues. No fevers or chills.  A chest x-ray performed previously was stable. She has a history of pneumonia from April, which was associated with significant illness and the onset of ear congestion.  Her current medications include Xyzal daily, Flonase, and omeprazole  for reflux. She has tried Sudafed in the past without significant relief. She has a history of a loose, productive cough with mucus, which has improved over the last few days, although she still experiences some postnasal drip and upper airway cough.  Family history includes her grandson's father, who had a heart attack at the age of 13, attributed to a genetic condition despite being physically fit.     ROS  Past  Surgical History:  Procedure Laterality  Date   ARTHRODESIS METATARSALPHALANGEAL JOINT (MTPJ) Right 10/08/2015   Procedure: RIGHT HALLUX METATARSAL PHALANGEAL JOINT  ARTHRODESIS;  Surgeon: Amada Backer, MD;  Location: [facility];  Service: Orthopedics;  Laterality: Right;   caesarean section     CESAREAN SECTION     HAMMERTOE RECONSTRUCTION WITH WEIL OSTEOTOMY Right 10/08/2015   Procedure: RIGHT SECOND METATARSAL WEIL HAMMERTOE CORRECTION;  Surgeon: Amada Backer, MD;  Location: [facility];  Service: Orthopedics;  Laterality: Right;   TONSILLECTOMY      Outpatient Medications Prior to Visit  Medication Sig Dispense Refill   celecoxib  (CELEBREX ) 100 MG capsule Take 100 mg by mouth 2 (two) times daily.     clobetasol (TEMOVATE) 0.05 % external solution Apply topically daily.     co-enzyme Q-10 30 MG capsule Take 30 mg by mouth 3 (three) times daily. Pt is taking 200MG      irbesartan-hydrochlorothiazide (AVALIDE) 150-12.5 MG tablet Take 1 tablet by mouth daily.     omeprazole  (PRILOSEC) 20 MG capsule Take 1 capsule (20 mg total) by mouth daily. 90 capsule 3   rosuvastatin (CRESTOR) 5 MG tablet Take 5 mg by mouth daily.   3   amoxicillin -clavulanate (AUGMENTIN ) 875-125 MG tablet Take 1 tablet by mouth 2 (two) times daily. (Patient not taking: Reported on 02/22/2024) 20 tablet 0   No facility-administered medications prior to visit.    Family History  Problem Relation Age of Onset   Cancer Mother    Coronary artery disease Father     Social History   Socioeconomic History   Marital status: Married    Spouse name: Not on file   Number of children: Not on file   Years of education: Not on file   Highest education level: Associate degree: occupational, Scientist, product/process development, or vocational program  Occupational History   Not on file  Tobacco Use   Smoking status: Former   Smokeless tobacco: Never   Tobacco comments:    only in h.s.  Vaping Use   Vaping status: Never Used   Substance and Sexual Activity   Alcohol use: Yes    Comment: occ   Drug use: No   Sexual activity: Not Currently    Birth control/protection: Post-menopausal    Comment: 1st intercourse- 20, partners- 1  Other Topics Concern   Not on file  Social History Narrative   HSG, community college   Married '68   1 daughter- '80, 1 son- '77; two grandchildren   Work: was a Diplomatic Services operational officer, full time homemaker   Marriage in good health   End of care: no cpr, no mechanical ventalation, no futile or heroic measures.   Social Drivers of Corporate investment banker Strain: Low Risk  (01/19/2024)   Overall Financial Resource Strain (CARDIA)    Difficulty of Paying Living Expenses: Not hard at all  Food Insecurity: No Food Insecurity (01/19/2024)   Hunger Vital Sign    Worried About Running Out of Food in the Last Year: Never true    Ran Out of Food in the Last Year: Never true  Transportation Needs: No Transportation Needs (01/19/2024)   PRAPARE - Administrator, Civil Service (Medical): No    Lack of Transportation (Non-Medical): No  Physical Activity: Unknown (01/19/2024)   Exercise Vital Sign    Days of Exercise per Week: 0 days    Minutes of Exercise per Session: Not on file  Stress: Stress Concern Present (01/19/2024)   [employer] of Occupational Health -  Occupational Stress Questionnaire    Feeling of Stress : Rather much  Social Connections: Socially Integrated (01/19/2024)   Social Connection and Isolation Panel    Frequency of Communication with Friends and Family: More than three times a week    Frequency of Social Gatherings with Friends and Family: Once a week    Attends Religious Services: More than 4 times per year    Active Member of [employer] or Organizations: Yes    Attends Engineer, structural: More than 4 times per year    Marital Status: Married  Catering manager Violence: Not on file                                                                                                   Objective:  Physical Exam: BP 122/80   Pulse 72   Temp 97.6 F (36.4 C) (Temporal)   Ht 5' 5 (1.651 m)   Wt 153 lb 12.8 oz (69.8 kg)   SpO2 99%   BMI 25.59 kg/m    Physical Exam GENERAL: Alert, cooperative, well developed, no acute distress. HEENT: Normocephalic, normal oropharynx, moist mucous membranes, right tympanic membrane normal. CHEST: Clear to auscultation bilaterally, no wheezes, rhonchi, or crackles, lung sounds coarse at lower lobes. CARDIOVASCULAR: Normal heart rate and rhythm, S1 and S2 normal without murmurs. ABDOMEN: Soft, non-tender, non-distended, without organomegaly, normal bowel sounds. EXTREMITIES: No cyanosis or edema. NEUROLOGICAL: Cranial nerves grossly intact, moves all extremities without gross motor or sensory deficit.   Physical Exam Constitutional:      Appearance: Normal appearance.  HENT:     Head: Normocephalic and atraumatic.     Right Ear: Hearing, tympanic membrane, ear canal and external ear normal.     Left Ear: Hearing, tympanic membrane, ear canal and external ear normal.     Nose: Nose normal.     Right Sinus: No maxillary sinus tenderness or frontal sinus tenderness.     Left Sinus: No maxillary sinus tenderness or frontal sinus tenderness.     Mouth/Throat:     Mouth: Mucous membranes are moist.     Dentition: Normal dentition.     Pharynx: Oropharynx is clear.     Tonsils: No tonsillar exudate. 0 on the right. 0 on the left.   Eyes:     General: No scleral icterus.       Right eye: No discharge.        Left eye: No discharge.     Extraocular Movements: Extraocular movements intact.   Neck:     Thyroid : No thyroid  mass.  Pulmonary:     Effort: Pulmonary effort is normal.   Musculoskeletal:     Cervical back: Full passive range of motion without pain, normal range of motion and neck supple.  Lymphadenopathy:     Cervical: No cervical adenopathy.   Skin:    General: Skin is warm.      Findings: No rash.   Neurological:     General: No focal deficit present.     Mental Status: She is alert.     Cranial Nerves: No  cranial nerve deficit.   Psychiatric:        Mood and Affect: Mood normal.        Behavior: Behavior normal.        Thought Content: Thought content normal.        Judgment: Judgment normal.     DG Chest 2 View Result Date: 01/05/2024 CLINICAL DATA:  Pneumonia. EXAM: CHEST - 2 VIEW COMPARISON:  X-ray 12/04/2023 FINDINGS: No consolidation, pneumothorax or effusion. No edema. Normal cardiopericardial silhouette. Tortuous ectatic aorta. Mild degenerative changes of the spine. Air-fluid level along the stomach beneath the left hemidiaphragm. IMPRESSION: No acute cardiopulmonary disease. Electronically Signed   By: Adrianna Horde M.D.   On: 01/05/2024 14:28   XR HIP UNILAT W OR W/O PELVIS 2-3 VIEWS LEFT Result Date: 01/05/2024 2 view x-ray of the left hip including AP pelvis and left lateral hip x-ray were ordered and reviewed by myself today.  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  There is no acute fracture or advanced arthritic change.  No spurring of the greater tuberosity.  DG Chest 2 View Result Date: 12/04/2023 CLINICAL DATA:  Cough and fever.  Shortness of breath EXAM: CHEST - 2 VIEW COMPARISON:  X-ray 08/05/2015 FINDINGS: Tiny left effusion versus pleural thickening. Parenchymal opacity also seen. Subtle infiltrate is possible. No pneumothorax or edema. Normal cardiopericardial silhouette. Tortuous and ectatic aorta. IMPRESSION: Small left effusion worse both thickening and some adjacent opacity. Infiltrate is possible. Recommend follow-up. Electronically Signed   By: Adrianna Horde M.D.   On: 12/04/2023 11:33    Recent Results (from the past 2160 hours)  POC Covid19/Flu A&B Antigen     Status: None   Collection Time: 11/30/23  9:09 AM  Result Value Ref Range   Influenza A Antigen, POC Negative Negative   Influenza B Antigen,  POC Negative Negative   Covid Antigen, POC Negative Negative        Carnell Christian, MD, MS

## 2024-02-27 ENCOUNTER — Encounter (INDEPENDENT_AMBULATORY_CARE_PROVIDER_SITE_OTHER): Payer: Self-pay

## 2024-03-10 ENCOUNTER — Other Ambulatory Visit: Payer: Self-pay | Admitting: Sports Medicine

## 2024-03-18 ENCOUNTER — Encounter: Payer: Self-pay | Admitting: Sports Medicine

## 2024-03-18 ENCOUNTER — Ambulatory Visit (INDEPENDENT_AMBULATORY_CARE_PROVIDER_SITE_OTHER): Admitting: Sports Medicine

## 2024-03-18 DIAGNOSIS — R29898 Other symptoms and signs involving the musculoskeletal system: Secondary | ICD-10-CM | POA: Diagnosis not present

## 2024-03-18 DIAGNOSIS — M67952 Unspecified disorder of synovium and tendon, left thigh: Secondary | ICD-10-CM | POA: Diagnosis not present

## 2024-03-18 DIAGNOSIS — M25511 Pain in right shoulder: Secondary | ICD-10-CM | POA: Diagnosis not present

## 2024-03-18 DIAGNOSIS — G8929 Other chronic pain: Secondary | ICD-10-CM

## 2024-03-18 DIAGNOSIS — M25551 Pain in right hip: Secondary | ICD-10-CM

## 2024-03-18 DIAGNOSIS — M25552 Pain in left hip: Secondary | ICD-10-CM

## 2024-03-18 DIAGNOSIS — M12811 Other specific arthropathies, not elsewhere classified, right shoulder: Secondary | ICD-10-CM

## 2024-03-18 MED ORDER — NITROGLYCERIN 0.2 MG/HR TD PT24: MEDICATED_PATCH | TRANSDERMAL | 0 refills | Status: DC

## 2024-03-18 NOTE — Progress Notes (Signed)
 Patient says that she got temporary relief from the injection. She has been doing the first two exercises on her home exercise sheet as the other two were painful for her to do. She has noticed that when standing on the right foot, she will get some pain in the right hip. She says that with the side-lying lateral raise, she has pain in the lower leg. She does do her exercises daily.   Patient also mentions that her right shoulder pain is also gradually returning.  Patient was instructed in 10 minutes of therapeutic exercises for right shoulder to improve strength, ROM and function according to my instructions and plan of care by a Certified Athletic Trainer during the office visit. A customized handout was provided and demonstration of proper technique shown and discussed. Patient did perform exercises and demonstrate understanding through teachback.  All questions discussed and answered.

## 2024-03-18 NOTE — Progress Notes (Signed)
 Kimberly Cox - 78 y.o. female MRN [id number]  Date of birth: April 26, 1946  Office Visit Note: Visit Date: 03/18/2024 PCP: Sebastian Beverley NOVAK, MD Referred by: Sebastian Beverley NOVAK, MD  Subjective: Chief Complaint  Patient presents with   Left Hip - Follow-up   HPI: Kimberly Cox is a pleasant 78 y.o. female who presents today for follow-up of left lateral hip pain. Also with worsening right shoulder pain.  Left hip -back over 1 month ago we did perform ultrasound-guided greater trochanteric injection.  She had 100% pain relief for the first short while and very good relief for about 2 weeks, and then following that her benefit started to wane.  She has been doing some home exercises for the hips, although does feel some mild discomfort with external/internal rotation on the contralateral leg/hip.  Right shoulder - On 08/01/23 had SAJ injection which relieved her pain by 90-95%.  This lasted for about 1 month and then her pain slowly started to return.  It was feeling much better until just recently she is starting to notice that it is gradually returning.  She has been taking Celebrex  100 mg once to twice daily for many months now.  She is unsure the exact effect/benefit of this.  Pertinent ROS were reviewed with the patient and found to be negative unless otherwise specified above in HPI.   Assessment & Plan: Visit Diagnoses:  1. Tendinopathy of left gluteus medius   2. Weakness of left hip   3. Chronic hip pain, bilateral   4. Chronic right shoulder pain   5. Rotator cuff arthropathy of right shoulder    Plan: Impression is residual chronic left lateral hip pain with evidence of gluteal tendinopathy and hip abduction weakness.  She does have a history of significant tendinosis on the contralateral hip from previous MRI, although this improved quite significantly with PT/rehab and multiple treatments of extracorporeal shockwave therapy.  She is leaving for a trip upcoming in a few weeks,  given this we will hold from shockwave therapy and begin a trial of nitroglycerin  patch 0.2 mg/h which she will place on the affected left lateral hip/gluteal tendons, changing once daily.  We discussed taking a holiday from Celebrex  100 mg, she will discontinue this for the next 7-10 days consistently and see what sort of pain response she has.  I did modify her home hip stabilization exercises for the hips today, new handout provided.  We also reviewed rotator cuff rehab protocol for her which she will do at home, and new sheet was provided and my athletic trainer, Jinnie, did review these with her in the room today.  She will perform these once daily.  I will see her back in about 1 month to reevaluate both the hips and her right shoulder.  Additional treatment considerations: Repeat subacromial joint injection versus ultrasound-guided glenohumeral joint injection for the right shoulder if needed  Follow-up: Return in about 1 month (around 04/19/2024) for 30-min appt for L-hip, R-shoulder f/u in 90-month.   Meds & Orders:  Meds ordered this encounter  Medications   nitroGLYCERIN  (NITRODUR - DOSED IN MG/24 HR) 0.2 mg/hr patch    Sig: Cut patch into fourths. Apply 1/4 patch over affected area on lateral hip. Change once every 24 hours.    Dispense:  25 patch    Refill:  0   No orders of the defined types were placed in this encounter.    Procedures: No procedures performed  Clinical History: No specialty comments available.  She reports that she has quit smoking. She has never used smokeless tobacco. No results for input(s): HGBA1C, LABURIC in the last 8760 hours.  Objective:    Physical Exam  Gen: Well-appearing, in no acute distress; non-toxic CV: Well-perfused. Warm.  Resp: Breathing unlabored on room air; no wheezing. Psych: Fluid speech in conversation; appropriate affect; normal thought process  Ortho Exam - Right shoulder: No redness swelling or effusion.  No mechanical  blocks to range of motion  - Left hip: + Mild TTP over the greater trochanteric region.  There is 4/5 strength with weakness with resisted hip abduction compared to near full strength with resisted hip abduction on the contralateral side.  There is no restriction with logroll testing.  Imaging:  01/05/24: 2 view x-ray of the left hip including AP pelvis and left lateral hip  x-ray were ordered and reviewed by myself today.  X-rays demonstrate at  least mild osteoarthritic change of bilateral hips.  There is no acute  fracture or advanced arthritic change.  No spurring of the greater  tuberosity.   06/13/23: 3 views of the right shoulder including AP, Grashey and axial view were  ordered and reviewed by myself.  There is at least moderate AC joint  arthritic change, mild glenohumeral joint arthritis.  Humeral head is well  located within the groove.  There is no acute fracture or otherwise bony  abnormality noted.   Past Medical/Family/Surgical/Social History: Medications & Allergies reviewed per EMR, new medications updated. Patient Active Problem List   Diagnosis Date Noted   Upper airway cough syndrome 02/22/2024   Dysfunction of right eustachian tube 01/23/2024   History of pneumonia 01/23/2024   Diuretic-induced hypokalemia 03/31/2020   Sciatica of right side 05/01/2018   Piriformis syndrome of left side 11/27/2017   GERD with esophagitis 08/22/2017   Asthmatic bronchitis 08/22/2017   Prediabetes 02/02/2017   Pure hyperglyceridemia 02/02/2017   Degenerative cervical disc 01/26/2016   Degenerative lumbar disc 01/26/2016   Spinal stenosis of lumbar region with radiculopathy 01/20/2016   Routine general medical examination at a health care facility 07/18/2014   Hereditary and idiopathic peripheral neuropathy 01/31/2008   Hyperlipidemia LDL goal <130 12/05/2007   Primary hypertension 12/05/2007   Past Medical History:  Diagnosis Date   Anxiety    Anxiety disorder     Concussion Dec 13th, 2011   fell, striking her head against a wall, developed dizziness - UC eval c/w concussion. Had follow-up and did ok.    History of chest pain    Hyperlipidemia    Hypertension    Plantar fasciitis    Renal stone    Spinal stenosis, lumbar    Family History  Problem Relation Age of Onset   Cancer Mother    Coronary artery disease Father    Past Surgical History:  Procedure Laterality Date   ARTHRODESIS METATARSALPHALANGEAL JOINT (MTPJ) Right 10/08/2015   Procedure: RIGHT HALLUX METATARSAL PHALANGEAL JOINT  ARTHRODESIS;  Surgeon: Norleen Armor, MD;  Location: [facility];  Service: Orthopedics;  Laterality: Right;   caesarean section     CESAREAN SECTION     HAMMERTOE RECONSTRUCTION WITH WEIL OSTEOTOMY Right 10/08/2015   Procedure: RIGHT SECOND METATARSAL WEIL HAMMERTOE CORRECTION;  Surgeon: Norleen Armor, MD;  Location: [facility];  Service: Orthopedics;  Laterality: Right;   TONSILLECTOMY     Social History   Occupational History   Not on file  Tobacco Use  Smoking status: Former   Smokeless tobacco: Never   Tobacco comments:    only in h.s.  Vaping Use   Vaping status: Never Used  Substance and Sexual Activity   Alcohol use: Yes    Comment: occ   Drug use: No   Sexual activity: Not Currently    Birth control/protection: Post-menopausal    Comment: 1st intercourse- 20, partners- 1

## 2024-03-19 DIAGNOSIS — Z1211 Encounter for screening for malignant neoplasm of colon: Secondary | ICD-10-CM | POA: Diagnosis not present

## 2024-03-25 LAB — COLOGUARD: COLOGUARD: NEGATIVE

## 2024-03-27 ENCOUNTER — Other Ambulatory Visit: Payer: Self-pay | Admitting: Neurological Surgery

## 2024-03-27 DIAGNOSIS — M48062 Spinal stenosis, lumbar region with neurogenic claudication: Secondary | ICD-10-CM

## 2024-03-28 ENCOUNTER — Encounter: Payer: Self-pay | Admitting: Neurological Surgery

## 2024-04-01 ENCOUNTER — Encounter: Payer: Self-pay | Admitting: Internal Medicine

## 2024-04-01 ENCOUNTER — Ambulatory Visit: Admitting: Internal Medicine

## 2024-04-01 VITALS — BP 138/80 | HR 71 | Temp 98.1°F | Ht 65.0 in | Wt 154.6 lb

## 2024-04-01 DIAGNOSIS — J22 Unspecified acute lower respiratory infection: Secondary | ICD-10-CM

## 2024-04-01 MED ORDER — AZITHROMYCIN 250 MG PO TABS: ORAL_TABLET | ORAL | 0 refills | Status: AC

## 2024-04-01 MED ORDER — BUDESONIDE-FORMOTEROL FUMARATE 160-4.5 MCG/ACT IN AERO: 2.0000 | INHALATION_SPRAY | Freq: Two times a day (BID) | RESPIRATORY_TRACT | 3 refills | Status: DC

## 2024-04-01 NOTE — Progress Notes (Signed)
 [facility] [address] [place] [address] Dept: [phone] Dept Fax: [phone]  Acute Care Office Visit  Subjective:   Kimberly Cox 1946-02-24 04/01/2024  Chief Complaint  Patient presents with   Cough   head congestion    Started 10 days ago     HPI:  Discussed the use of AI scribe software for clinical note transcription with the patient, who gave verbal consent to proceed.  History of Present Illness   Kimberly Cox is a 78 year old female who presents with a persistent dry cough and sinus congestion.  She has been experiencing a dry cough for approximately ten days, characterized by a 'tickle' in her throat that triggers coughing fits. The cough is non-productive, and she describes a persistent scratchy throat. No fever, chest pain, difficulty breathing, or wheezing.  She notes increased sinus congestion over the past few days, primarily affecting the right side, with some involvement of the left ear. Her right ear has been blocked since mid-April, and she has an upcoming appointment with an ENT specialist in September.  She has been exposed to grandchildren who were diagnosed with strep throat and other respiratory illnesses.  She has been taking Mucinex to manage her symptoms. She has a history of bronchitis and has used Symbicort  in the past.       The following portions of the patient's history were reviewed and updated as appropriate: past medical history, past surgical history, family history, social history, allergies, medications, and problem list.   Patient Active Problem List   Diagnosis Date Noted   Upper airway cough syndrome 02/22/2024   Dysfunction of right eustachian tube 01/23/2024   History of pneumonia 01/23/2024   Diuretic-induced hypokalemia 03/31/2020   Sciatica of right side 05/01/2018   Piriformis syndrome of left side 11/27/2017   GERD with esophagitis 08/22/2017   Asthmatic  bronchitis 08/22/2017   Prediabetes 02/02/2017   Pure hyperglyceridemia 02/02/2017   Degenerative cervical disc 01/26/2016   Degenerative lumbar disc 01/26/2016   Spinal stenosis of lumbar region with radiculopathy 01/20/2016   Routine general medical examination at a health care facility 07/18/2014   Hereditary and idiopathic peripheral neuropathy 01/31/2008   Hyperlipidemia LDL goal <130 12/05/2007   Primary hypertension 12/05/2007   Past Medical History:  Diagnosis Date   Anxiety    Anxiety disorder    Concussion Dec 13th, 2011   fell, striking her head against a wall, developed dizziness - UC eval c/w concussion. Had follow-up and did ok.    History of chest pain    Hyperlipidemia    Hypertension    Plantar fasciitis    Renal stone    Spinal stenosis, lumbar    Past Surgical History:  Procedure Laterality Date   ARTHRODESIS METATARSALPHALANGEAL JOINT (MTPJ) Right 10/08/2015   Procedure: RIGHT HALLUX METATARSAL PHALANGEAL JOINT  ARTHRODESIS;  Surgeon: Norleen Armor, MD;  Location: [facility];  Service: Orthopedics;  Laterality: Right;   caesarean section     CESAREAN SECTION     HAMMERTOE RECONSTRUCTION WITH WEIL OSTEOTOMY Right 10/08/2015   Procedure: RIGHT SECOND METATARSAL WEIL HAMMERTOE CORRECTION;  Surgeon: Norleen Armor, MD;  Location: [facility];  Service: Orthopedics;  Laterality: Right;   TONSILLECTOMY     Family History  Problem Relation Age of Onset   Cancer Mother    Coronary artery disease Father     Current Outpatient Medications:    azithromycin  (ZITHROMAX ) 250 MG tablet,  Take 2 tablets on day 1, then 1 tablet daily on days 2 through 5, Disp: 6 tablet, Rfl: 0   celecoxib  (CELEBREX ) 100 MG capsule, TAKE 1 CAPSULE(100 MG) BY MOUTH TWICE DAILY BETWEEN MEALS AS NEEDED, Disp: 60 capsule, Rfl: 0   clobetasol (TEMOVATE) 0.05 % external solution, Apply topically daily., Disp: , Rfl:    co-enzyme Q-10 30 MG capsule, Take 30 mg by mouth 3  (three) times daily. Pt is taking 200MG , Disp: , Rfl:    irbesartan-hydrochlorothiazide (AVALIDE) 150-12.5 MG tablet, Take 1 tablet by mouth daily., Disp: , Rfl:    nitroGLYCERIN  (NITRODUR - DOSED IN MG/24 HR) 0.2 mg/hr patch, Cut patch into fourths. Apply 1/4 patch over affected area on lateral hip. Change once every 24 hours., Disp: 25 patch, Rfl: 0   omeprazole  (PRILOSEC) 20 MG capsule, Take 1 capsule (20 mg total) by mouth daily., Disp: 90 capsule, Rfl: 3   rosuvastatin (CRESTOR) 5 MG tablet, Take 5 mg by mouth daily. , Disp: , Rfl: 3   budesonide -formoterol  (SYMBICORT ) 160-4.5 MCG/ACT inhaler, Inhale 2 puffs into the lungs 2 (two) times daily., Disp: 1 each, Rfl: 3 Allergies  Allergen Reactions   Qc Tumeric Complex [Turmeric] Diarrhea   Omnicef  [Cefdinir ] Itching     ROS: A complete ROS was performed with pertinent positives/negatives noted in the HPI. The remainder of the ROS are negative.    Objective:   Today's Vitals   04/01/24 1020  BP: 138/80  Pulse: 71  Temp: 98.1 F (36.7 C)  TempSrc: Temporal  SpO2: 99%  Weight: 154 lb 9.6 oz (70.1 kg)  Height: 5' 5 (1.651 m)    GENERAL: Well-appearing, in NAD. Well nourished.  SKIN: Pink, warm and dry. No rash.  HEENT:    HEAD: Normocephalic, non-traumatic.  EYES: Conjunctive pink without exudate. PERRL.  EARS: External ear w/o redness, swelling, masses, or lesions. EAC clear. TM's intact, translucent w/o bulging, appropriate landmarks visualized.  NOSE: Septum midline w/o deformity. Nares patent, mucosa pink and non-inflamed w/o drainage. No sinus tenderness.  THROAT: Uvula midline. Oropharynx clear. Tonsils absent. Mucus membranes pink and moist.  NECK: Trachea midline. Full ROM w/o pain or tenderness. No lymphadenopathy.  RESPIRATORY: Chest wall symmetrical. Respirations even and non-labored. Breath sounds clear to auscultation bilaterally.  CARDIAC: S1, S2 present, regular rate and rhythm. Peripheral pulses 2+ bilaterally.   EXTREMITIES: Without clubbing, cyanosis, or edema.  NEUROLOGIC: Steady, even gait.  PSYCH/MENTAL STATUS: Alert, oriented x 3. Cooperative, appropriate mood and affect.    No results found for any visits on 04/01/24.    Assessment & Plan:  Assessment and Plan    Acute Respiratory Infection - Prescribe azithromycin . - Advise warm salt water gargles and throat lozenges. - Recommend acetaminophen  for pain relief if needed. - Suggest cetirizine once daily for sinus congestion. - Encourage sinus rinses. - Prescribe Symbicort  inhaler. Instructed on proper inhaler use, including shaking, breathing technique, and rinsing mouth post-use.   Meds ordered this encounter  Medications   budesonide -formoterol  (SYMBICORT ) 160-4.5 MCG/ACT inhaler    Sig: Inhale 2 puffs into the lungs 2 (two) times daily.    Dispense:  1 each    Refill:  3   azithromycin  (ZITHROMAX ) 250 MG tablet    Sig: Take 2 tablets on day 1, then 1 tablet daily on days 2 through 5    Dispense:  6 tablet    Refill:  0    Supervising Provider:   THOMPSON, AARON B [[id number]]   No orders  of the defined types were placed in this encounter.  Lab Orders  No laboratory test(s) ordered today   No images are attached to the encounter or orders placed in the encounter.  Return if symptoms worsen or fail to improve.   Rosina Senters, FNP

## 2024-04-01 NOTE — Patient Instructions (Signed)
 Take zyrtec once daily  Can do sinus rinse / nedi pot for sinus congestion   Salt water gargles Throat lozenges  Tylenol  as needed for sore throat

## 2024-04-04 ENCOUNTER — Encounter: Payer: Self-pay | Admitting: Sports Medicine

## 2024-04-04 ENCOUNTER — Other Ambulatory Visit: Payer: Self-pay | Admitting: Sports Medicine

## 2024-04-04 MED ORDER — HYDROCODONE-ACETAMINOPHEN 5-325 MG PO TABS: 1.0000 | ORAL_TABLET | Freq: Four times a day (QID) | ORAL | 0 refills | Status: DC | PRN

## 2024-04-11 ENCOUNTER — Other Ambulatory Visit: Payer: Self-pay | Admitting: Sports Medicine

## 2024-04-16 ENCOUNTER — Encounter: Payer: Self-pay | Admitting: Obstetrics and Gynecology

## 2024-04-18 ENCOUNTER — Encounter: Payer: Self-pay | Admitting: Sports Medicine

## 2024-04-18 ENCOUNTER — Ambulatory Visit (INDEPENDENT_AMBULATORY_CARE_PROVIDER_SITE_OTHER): Admitting: Sports Medicine

## 2024-04-18 DIAGNOSIS — M67952 Unspecified disorder of synovium and tendon, left thigh: Secondary | ICD-10-CM | POA: Diagnosis not present

## 2024-04-18 DIAGNOSIS — M25552 Pain in left hip: Secondary | ICD-10-CM

## 2024-04-18 DIAGNOSIS — R29898 Other symptoms and signs involving the musculoskeletal system: Secondary | ICD-10-CM

## 2024-04-18 DIAGNOSIS — M67951 Unspecified disorder of synovium and tendon, right thigh: Secondary | ICD-10-CM | POA: Diagnosis not present

## 2024-04-18 DIAGNOSIS — M25551 Pain in right hip: Secondary | ICD-10-CM | POA: Diagnosis not present

## 2024-04-18 DIAGNOSIS — M12811 Other specific arthropathies, not elsewhere classified, right shoulder: Secondary | ICD-10-CM

## 2024-04-18 DIAGNOSIS — G8929 Other chronic pain: Secondary | ICD-10-CM

## 2024-04-18 NOTE — Progress Notes (Signed)
 Kimberly Cox - 78 y.o. female MRN [id number]  Date of birth: Jul 24, 1946  Office Visit Note: Visit Date: 04/18/2024 PCP: Sebastian Beverley NOVAK, MD Referred by: Sebastian Beverley NOVAK, MD  Subjective: Chief Complaint  Patient presents with   Left Hip - Follow-up   Right Shoulder - Follow-up   HPI: Kimberly Cox is a pleasant 78 y.o. female who presents today for chronic bilateral hip pain L > R, R-shoulder pain.  Bilateral hips -she is having pain over the lateral aspect of both hips.  Her left is worse than right.  We did perform greater trochanteric injection with corticosteroid back in 02/12/2024 which gave her 1 month of very good relief before her pain slowly started returning.  For the right hip, we did treat her for this in years past with multiple treatments of extracorporeal shockwave therapy which was significantly helpful.  She still continues with her nitroglycerin  patch for the left lateral hip.  Right shoulder -does have some rotator cuff arthropathy.  This is doing okay.  She is asking about hormone replacement to help with muscle and tendons.  She does have an upcoming appointment with her OB/GYN in October to discuss.  We did recommend creatine 5 g daily, she does have this at home but has not been taking.  Terms of pain control, she is using Celebrex  100 mg once to twice daily.  This does help control her pain.  She is inquiring about handicap placard today.  Pertinent ROS were reviewed with the patient and found to be negative unless otherwise specified above in HPI.   Assessment & Plan: Visit Diagnoses:  1. Chronic hip pain, bilateral   2. Tendinopathy of left gluteus medius   3. Weakness of left hip   4. Tendinopathy of right gluteus medius   5. Rotator cuff arthropathy of right shoulder    Plan: Impression is acute on chronic bilateral lateral hip pain with previous MRI confirmed evidence of severe right gluteus minimus and medius tendinosis with high-grade  partial-thickness tearing.  We did incompletely visualized the left hip but have not had an MRI of that, we will move forward with this to help guide treatment which may include PRP injection therapy, shockwave therapy, or possible surgical intervention.  She has been attempting to continue her home exercises but pain is significantly limiting her.  She has completed multiple rounds of physical therapy for her hips.  Discussed taking creatine 5 g daily to help with muscle breakdown prevention, also recommended strength-based training at least 2-3 times weekly.  She will continue her nitroglycerin  patch 0.2 mg/h for the left hip, discussed also applying this to the right hip as long she did not have side effects such as headache.  We did provide a handicap placard today for her, filled out during the visit.  She may continue her Celebrex  100 mg daily, but we did discuss if considering proceeding with PRP she would need to stop this for 10 days before and 2 weeks after.  Information regarding PRP applied to AVS today.  Meds & Orders: No orders of the defined types were placed in this encounter.   Orders Placed This Encounter  Procedures   MR Hip Left w/o contrast     Procedures: No procedures performed      Clinical History: No specialty comments available.  She reports that she has quit smoking. She has never used smokeless tobacco. No results for input(s): HGBA1C, LABURIC in the last 8760 hours.  Objective:  Physical Exam  Gen: Well-appearing, in no acute distress; non-toxic CV: Well-perfused. Warm.  Resp: Breathing unlabored on room air; no wheezing. Psych: Fluid speech in conversation; appropriate affect; normal thought process  Ortho Exam - Bilateral hips: Has been tenderness over bilateral greater trochanteric regions, left greater than right.  Strength testing was not tested today although in the past she had weakness with resisted hip abduction bilaterally, left greater than  right.  Imaging:  *Reviewed hip MRI today during the visit.  Narrative & Impression  CLINICAL DATA:  Hip pain, chronic, articular cartilage eval, xray done assess cartilage, troch bursitis, assess tendon   EXAM: MR OF THE RIGHT HIP WITHOUT CONTRAST   TECHNIQUE: Multiplanar, multisequence MR imaging was performed. No intravenous contrast was administered.   COMPARISON:  X-ray 02/06/2023   FINDINGS: Bones: No acute fracture. No dislocation. No femoral head avascular necrosis. Bony pelvis intact without diastasis. SI joints and pubic symphysis within normal limits. Degenerative lower lumbar spondylosis, not well assessed. No bone marrow edema. No marrow replacing bone lesion.   Articular cartilage and labrum   Articular cartilage: Mild chondral thinning. There is fissuring along the superior chondrolabral junction. No subchondral marrow edema.   Labrum:  Superior labral degeneration.  No paralabral cyst.   Joint or bursal effusion   Joint effusion:  None.   Bursae: Small-moderate volume right peritrochanteric bursal fluid.   Muscles and tendons   Muscles and tendons: Severe tendinosis of the right gluteus minimus tendon with high-grade partial-thickness insertional tearing. Mild right gluteus medius tendinosis without tear. Mild tendinosis of the contralateral left gluteal tendons. The hamstring, iliopsoas, rectus femoris, and adductor tendons appear intact without tear or significant tendinosis. Mild intramuscular edema within the proximal adductor compartment musculature, right greater than left. Mild intramuscular edema within the right gluteus medius muscle.   Other findings   Miscellaneous: No soft tissue edema or fluid collection. No inguinal lymphadenopathy.   IMPRESSION: 1. Severe tendinosis of the right gluteus minimus tendon with high-grade partial-thickness insertional tearing. Mild right gluteus medius tendinosis without tear. 2. Mild-moderate  right peritrochanteric bursitis. 3. Mild intramuscular edema within the proximal adductor compartment musculature, right greater than left, and right gluteus medius muscle which may reflect low-grade strains. 4. Mild right hip osteoarthritis.     Electronically Signed   By: Mabel Converse D.O.   On: 03/01/2023 13:55   Past Medical/Family/Surgical/Social History: Medications & Allergies reviewed per EMR, new medications updated. Patient Active Problem List   Diagnosis Date Noted   Upper airway cough syndrome 02/22/2024   Dysfunction of right eustachian tube 01/23/2024   History of pneumonia 01/23/2024   Diuretic-induced hypokalemia 03/31/2020   Sciatica of right side 05/01/2018   Piriformis syndrome of left side 11/27/2017   GERD with esophagitis 08/22/2017   Asthmatic bronchitis 08/22/2017   Prediabetes 02/02/2017   Pure hyperglyceridemia 02/02/2017   Degenerative cervical disc 01/26/2016   Degenerative lumbar disc 01/26/2016   Spinal stenosis of lumbar region with radiculopathy 01/20/2016   Routine general medical examination at a health care facility 07/18/2014   Hereditary and idiopathic peripheral neuropathy 01/31/2008   Hyperlipidemia LDL goal <130 12/05/2007   Primary hypertension 12/05/2007   Past Medical History:  Diagnosis Date   Anxiety    Anxiety disorder    Concussion Dec 13th, 2011   fell, striking her head against a wall, developed dizziness - UC eval c/w concussion. Had follow-up and did ok.    History of chest pain    Hyperlipidemia  Hypertension    Plantar fasciitis    Renal stone    Spinal stenosis, lumbar    Family History  Problem Relation Age of Onset   Cancer Mother    Coronary artery disease Father    Past Surgical History:  Procedure Laterality Date   ARTHRODESIS METATARSALPHALANGEAL JOINT (MTPJ) Right 10/08/2015   Procedure: RIGHT HALLUX METATARSAL PHALANGEAL JOINT  ARTHRODESIS;  Surgeon: Norleen Armor, MD;  Location: [facility];  Service: Orthopedics;  Laterality: Right;   caesarean section     CESAREAN SECTION     HAMMERTOE RECONSTRUCTION WITH WEIL OSTEOTOMY Right 10/08/2015   Procedure: RIGHT SECOND METATARSAL WEIL HAMMERTOE CORRECTION;  Surgeon: Norleen Armor, MD;  Location: [facility];  Service: Orthopedics;  Laterality: Right;   TONSILLECTOMY     Social History   Occupational History   Not on file  Tobacco Use   Smoking status: Former   Smokeless tobacco: Never   Tobacco comments:    only in h.s.  Vaping Use   Vaping status: Never Used  Substance and Sexual Activity   Alcohol use: Yes    Comment: occ   Drug use: No   Sexual activity: Not Currently    Birth control/protection: Post-menopausal    Comment: 1st intercourse- 20, partners- 1

## 2024-04-18 NOTE — Progress Notes (Signed)
 Patient says that both of the hips are now bothering her, with left worse than right. She says that she stopped doing her exercises due to recommendation after messaging on MyChart because of her pain. She took the prescribed Hydrocodone  for traveling only, which sometimes gave her relief and sometimes did not. She stopped taking Celebrex  for about a week, but noticed that she had an increase in pain so began taking it consistently again. She says that the hips are most bothersome, but her shoulder does still hurt, especially when pushing her car door open.   Patient is inquiring about a 90-day prescription, rather than 30-day. She is also inquiring about having a form for a handicap parking placard. She says that PRP has been mentioned at previous appointments as an option for treatment, and she would be interested in possibly doing that.

## 2024-04-18 NOTE — Patient Instructions (Signed)
 Dr. Burnetta' Instructions and What to Expect for PRP Injections:  Platelet-rich plasma is used in musculoskeletal medicine to focus your own body's ability to heal. It has several well-done published research trials (RCT) which demonstrate both its effectiveness and safety in many musculoskeletal conditions, including osteoarthritis, tendinopathies, partial tendon tears, and damaged vertebral discs. PRP has been in clinical use since the 1990's. Many people know that platelets form a clot if there is a cut in the skin. It turns out that platelets do not only form a clot, but they also start the body's own repair process. When platelets activate to form a clot, they release alpha granules which have hundreds of chemical messengers in them that initiate and organize repair to the damaged tissue. Precisely placing PRP into the site of injury will initiate the healing process by activating on the damaged cartilage, bone or tendon. This is an inflammatory process, and inflammation is the vital first phase of healing.  What to expect and how to prepare for PRP:   Depending on the procedure, you may need to arrange for a driver to bring you home (i.e. procedure on right/driving leg). IF you are having a lower extremity procedure, we can provide crutches as needed, but these are not routinely needed.   10 days prior to the procedure: Stop taking anti-inflammatory drugs like Ibuprofen/Motrin, Advil, Naprosyn, Celebrex , or Meloxicam . Even aspirin should be stopped (but need to discuss this with Dr. Burnetta and your cardiologist beforehand). Let Dr. Burnetta know if you have been taking prednisone  or other corticosteroids in the last 30 days as this can negatively interfere with the PRP process.   The day before the procedure: thoroughly shower and clean your skin.    The day of the procedure: Wear loose-fitting clothing like sweatpants or shorts. If you are having an upper body procedure wear a top that can button or  zip up. Please show up at least 15 minutes early for your appointment, as we will need to take you back to draw your blood prior to the procedure.    Things to avoid prior to procedure: tobacco/nicotine, alcohol, fatty foods. Tobacco is a potent toxin and its use constricts small blood vessels which are needed for tissue repair. Tobacco/nicotine use will limit the effectiveness of any treatment and stopping tobacco use is one of the single greatest actions you can take to improve your health. Avoid toxins like alcohol, which inhibits and depresses the cells needed for tissue repair.  PRP will initiate healing and a productive inflammation, and PRP therapy will make the body part treated sore for the first 3-4 days up to two weeks. Anti-inflammatory drugs (i.e. ibuprofen, Naprosyn, Celebrex ) and corticosteroids such as prednisone  can blunt or stop this process, so it is important to not take any anti-inflammatory drugs for 10 days before getting PRP therapy, or for at least two weeks after PRP therapy.   Depending on the body part injected, you may be in a sling or on crutches for several days. Most of the time these are not needed, but it is important to allow time for the body part to rest after the injection. By keeping the body part treated relaxed and not loading the body part the PRP can bind in place and do its job. If you push the body part too early, this may make the PRP less effective.  What happens during the PRP procedure?  Platelet rich plasma is made by taking some of your blood and performing a  two-stage centrifuge process on it to concentrate the PRP. First, your blood is drawn into a syringe with a small amount of anti-coagulant in it (this is to keep the blood from clotting during this process). The amount of blood drawn is usually about 10-30 milliliters, depending on how much PRP is needed for the treatment. Then the blood is transferred in a sterile fashion into a centrifuge tube. It  is then centrifuged for the first cycle where the red blood cells are isolated and discarded. In the second centrifuge cycle, the platelet-rich fraction of the remaining plasma is concentrated and placed in a syringe. The skin at the injection site is numbed with a small amount of topical cooling spray. Dr. Burnetta will then precisely inject the PRP into the injury site using ultrasound guidance.  What to do after your procedure:   NO anti-inflammatories (Ibuprofen/Motrin, Aleve, Meloxicam , etc.) for 2 weeks after injection. It is OK to use Tylenol  only if needed. I can also prescribe you specific medicine to control any discomfort you may have after the procedure if needed.   NO applying ice to the affected area for 2 weeks after the injection. It is OK to use heat.   Rest the affected body part. By keeping the body part treated relaxed and not loading the body part the PRP can bind in place and do its job. Be sure to ask Dr. Burnetta specific post-injection rest and guidelines to follow following your injection, as these will differ slightly depending on what type of PRP injection you receive. In general:   Allow 3-4 days of rest from physical labor or repetitive activity for the affected body part. It is ok to move the area, but no lifting or strenuous activity. After 3 days, unless otherwise instructed, the treated body part should be used and slowly moved through its full range of motion. It will be sore, but you will not damage it by moving it, in fact it needs to move to heal.   No strenuous activity, lifting weights, or dedicated therapy until the 2-week mark from the injection. After 2 weeks from the injection, this is when it is most advantageous to start physical therapy.   After two weeks, you may begin returning to activity, but it is a good idea to avoid activities that specifically hurt you before being treated. Exercise is vital to good health and finding a way to cross train around your  injury is important not only for your physical health, but your mental health as well. Ask me about cross training options for your injury. Some brief (10 minutes or less) period of heat therapy will not hurt the rehab, but it is not required. Usually, depending on the initial injury, physical therapy is started from two weeks to three weeks after injection. Improvements in pain and function should be expected from 6 weeks to 12 weeks after injection and some injuries may require more than one treatment.

## 2024-04-19 ENCOUNTER — Ambulatory Visit
Admission: RE | Admit: 2024-04-19 | Discharge: 2024-04-19 | Disposition: A | Discharge status: Home / Self Care | Source: Ambulatory Visit | Attending: Sports Medicine | Admitting: Sports Medicine

## 2024-04-19 DIAGNOSIS — M1612 Unilateral primary osteoarthritis, left hip: Secondary | ICD-10-CM | POA: Diagnosis not present

## 2024-04-19 DIAGNOSIS — M67952 Unspecified disorder of synovium and tendon, left thigh: Secondary | ICD-10-CM

## 2024-04-19 DIAGNOSIS — R29898 Other symptoms and signs involving the musculoskeletal system: Secondary | ICD-10-CM

## 2024-04-19 DIAGNOSIS — G8929 Other chronic pain: Secondary | ICD-10-CM

## 2024-04-26 ENCOUNTER — Ambulatory Visit (INDEPENDENT_AMBULATORY_CARE_PROVIDER_SITE_OTHER): Admitting: Sports Medicine

## 2024-04-26 ENCOUNTER — Encounter: Payer: Self-pay | Admitting: Sports Medicine

## 2024-04-26 DIAGNOSIS — S76012A Strain of muscle, fascia and tendon of left hip, initial encounter: Secondary | ICD-10-CM

## 2024-04-26 DIAGNOSIS — M25552 Pain in left hip: Secondary | ICD-10-CM

## 2024-04-26 DIAGNOSIS — S76011D Strain of muscle, fascia and tendon of right hip, subsequent encounter: Secondary | ICD-10-CM | POA: Diagnosis not present

## 2024-04-26 DIAGNOSIS — M7062 Trochanteric bursitis, left hip: Secondary | ICD-10-CM

## 2024-04-26 DIAGNOSIS — M7061 Trochanteric bursitis, right hip: Secondary | ICD-10-CM

## 2024-04-26 DIAGNOSIS — M25551 Pain in right hip: Secondary | ICD-10-CM

## 2024-04-26 DIAGNOSIS — G8929 Other chronic pain: Secondary | ICD-10-CM

## 2024-04-26 NOTE — Progress Notes (Signed)
 Kimberly Cox - 78 y.o. female MRN [id number]  Date of birth: 11-Oct-1945  Office Visit Note: Visit Date: 04/26/2024 PCP: Sebastian Beverley NOVAK, MD Referred by: Sebastian Beverley NOVAK, MD  Subjective: Chief Complaint  Patient presents with   Left Hip - Follow-up   Right Shoulder - Follow-up   HPI: Kimberly Cox is a pleasant 78 y.o. female who presents today for follow-up of bilateral lateral hip pain, here also for L-hip MRI review.   She has had chronic bilateral lateral hip pain for years intermittently in the past.  The right hip was bothersome last year and she did quite well with extracorporeal shockwave therapy.  We did trial the left lateral hip shockwave treatment but this did not give her significant relief.  The left side is more bothersome but her right side has been starting to bother her recently as well. Pain is preventing her from progressing through PT/home therapy. She is using Celebrex  100 mg twice daily for both of her hips and her overall joint pain which does help.  She trialed off of this medication and did notice an increase in her hip pain.  Pertinent ROS were reviewed with the patient and found to be negative unless otherwise specified above in HPI.   Assessment & Plan: Visit Diagnoses:  1. Chronic hip pain, bilateral   2. Trochanteric bursitis, left hip   3. Trochanteric bursitis, right hip   4. Tear of right gluteus minimus tendon, subsequent encounter   5. Tear of left gluteus minimus tendon, initial encounter    Plan: Impression is acute on chronic bilateral hip pain which has both physical exam findings and MRI confirmation of high-grade tendinosis with partial tearing of the left and right gluteus minimus with associated trochanteric bursitis.  Her left hip has been most symptomatic recently, but we did treat her right hip last year which responded very well to extracorporeal shockwave therapy.  Given the degree of bursitis and associated pain she is currently  having, we discussed moving forward with greater trochanteric bursa Toradol  injections.  Discussed the nature of avoiding repetitive corticosteroid in that area given her high-grade tendinopathy and partial tearing.  We did have a discussion regarding PRP injection therapy, I think this would be her next best option to help augment tendon healing and pain reduction short of a surgical intervention.  She is open to this and we did review pre and post injection PRP protocol, she would like to plan this for a time where she is not traveling and allow herself time off of Celebrex .  Likely the 2nd or 3rd week of October.  I would like to see her back in a few weeks to see how she responds to the Toradol  injections and we can consider shockwave treatment at that time if she needs.  She will continue her nitroglycerin  patch 0.2 mg/h for the left and may consider for the right as well.  She will continue his Celebrex  100 mg twice daily as needed with food.  Follow-up: Return in about 2 weeks (around 05/10/2024) for b/l lat hips.   Meds & Orders: No orders of the defined types were placed in this encounter.   Orders Placed This Encounter  Procedures   Large Joint Inj: R greater trochanter   Large Joint Inj: L greater trochanter     Procedures: Large Joint Inj: R greater trochanter on 04/26/2024 10:38 AM Indications: pain Details: 25 G 1.5 in needle, lateral approach Medications: 2 mL lidocaine  1 %;  2 mL bupivacaine  0.25 %; 30 mg ketorolac  30 MG/ML Outcome: tolerated well, no immediate complications Procedure, treatment alternatives, risks and benefits explained, specific risks discussed. Consent was given by the patient. Immediately prior to procedure a time out was called to verify the correct patient, procedure, equipment, support staff and site/side marked as required. Patient was prepped and draped in the usual sterile fashion.    Large Joint Inj: L greater trochanter on 04/26/2024 10:39 AM Indications:  pain Details: 25 G 1.5 in needle, lateral approach Medications: 2 mL lidocaine  1 %; 2 mL bupivacaine  0.25 %; 30 mg ketorolac  30 MG/ML Outcome: tolerated well, no immediate complications Procedure, treatment alternatives, risks and benefits explained, specific risks discussed. Consent was given by the patient. Immediately prior to procedure a time out was called to verify the correct patient, procedure, equipment, support staff and site/side marked as required. Patient was prepped and draped in the usual sterile fashion.          Clinical History: No specialty comments available.  She reports that she has quit smoking. She has never used smokeless tobacco. No results for input(s): HGBA1C, LABURIC in the last 8760 hours.  Objective:    Physical Exam  Gen: Well-appearing, in no acute distress; non-toxic CV: Well-perfused. Warm.  Resp: Breathing unlabored on room air; no wheezing. Psych: Fluid speech in conversation; appropriate affect; normal thought process  Ortho Exam - Bilateral hips: Positive TTP over bilateral greater trochanteric bursa L > R.  There is a degree of bursitis palpable on the left hip, I cannot appreciate this as much on the right.  There is weakness with left hip abduction secondary to pain and underlying gluteal insufficiency.  Fluid logroll of both hips without significant restriction.  Imaging:  *I did review the left hip MRI and previous right hip MRI today.  There is high-grade partial tearing of the left gluteus minimus and undersurface tearing of the left gluteus medius with insertional tendinopathy and reactive greater trochanteric bursitis that is moderate in nature.  MR Hip Left w/o contrast CLINICAL DATA:  Left hip pain and tenderness for 2 years  EXAM: MR OF THE LEFT HIP WITHOUT CONTRAST  TECHNIQUE: Multiplanar, multisequence MR imaging was performed. No intravenous contrast was administered.  COMPARISON:  None  Available.  FINDINGS: Bones:  No hip fracture, dislocation or avascular necrosis.  No periosteal reaction or bone destruction. No aggressive osseous lesion.  Mild osteoarthritis of the left SI joint. No SI joint widening or erosive changes.  Severe disc height loss at L4-5.  Articular cartilage and labrum  Articular cartilage: Mild partial eschars loss of the left femoral head and acetabulum.  Labrum:  Left labral degeneration and superior labral tear.  Joint or bursal effusion  Joint effusion:  No hip joint effusion.  No SI joint effusion.  Bursae: Moderate amount of fluid in the left greater trochanteric bursa.  Muscles and tendons  Flexors: Normal.  Extensors: Normal.  Abductors: Normal.  Adductors: Normal.  Gluteals: High-grade partial is tear of the left gluteus minimus tendon insertion. Mild tendinosis of the left gluteus medius tendon insertion with a small full-thickness tear along the anterior aspect.  Hamstrings: Normal.  Other findings  No pelvic free fluid. No fluid collection or hematoma. No inguinal lymphadenopathy. No inguinal hernia.  IMPRESSION: 1. High-grade partial is tear of the left gluteus minimus tendon insertion. 2. Mild tendinosis of the left gluteus medius tendon insertion with a small full-thickness tear along the anterior aspect. 3. Mild osteoarthritis of  the left hip. Left labral degeneration and superior labral tear. 4. Left greater trochanteric bursitis.  Electronically Signed   By: Julaine Blanch M.D.   On: 04/20/2024 09:56  Narrative & Impression  CLINICAL DATA:  Hip pain, chronic, articular cartilage eval, xray done assess cartilage, troch bursitis, assess tendon   EXAM: MR OF THE RIGHT HIP WITHOUT CONTRAST   TECHNIQUE: Multiplanar, multisequence MR imaging was performed. No intravenous contrast was administered.   COMPARISON:  X-ray 02/06/2023   FINDINGS: Bones: No acute fracture. No dislocation. No femoral  head avascular necrosis. Bony pelvis intact without diastasis. SI joints and pubic symphysis within normal limits. Degenerative lower lumbar spondylosis, not well assessed. No bone marrow edema. No marrow replacing bone lesion.   Articular cartilage and labrum   Articular cartilage: Mild chondral thinning. There is fissuring along the superior chondrolabral junction. No subchondral marrow edema.   Labrum:  Superior labral degeneration.  No paralabral cyst.   Joint or bursal effusion   Joint effusion:  None.   Bursae: Small-moderate volume right peritrochanteric bursal fluid.   Muscles and tendons   Muscles and tendons: Severe tendinosis of the right gluteus minimus tendon with high-grade partial-thickness insertional tearing. Mild right gluteus medius tendinosis without tear. Mild tendinosis of the contralateral left gluteal tendons. The hamstring, iliopsoas, rectus femoris, and adductor tendons appear intact without tear or significant tendinosis. Mild intramuscular edema within the proximal adductor compartment musculature, right greater than left. Mild intramuscular edema within the right gluteus medius muscle.   Other findings   Miscellaneous: No soft tissue edema or fluid collection. No inguinal lymphadenopathy.   IMPRESSION: 1. Severe tendinosis of the right gluteus minimus tendon with high-grade partial-thickness insertional tearing. Mild right gluteus medius tendinosis without tear. 2. Mild-moderate right peritrochanteric bursitis. 3. Mild intramuscular edema within the proximal adductor compartment musculature, right greater than left, and right gluteus medius muscle which may reflect low-grade strains. 4. Mild right hip osteoarthritis.     Electronically Signed   By: Mabel Converse D.O.   On: 03/01/2023 13:55     Past Medical/Family/Surgical/Social History: Medications & Allergies reviewed per EMR, new medications updated. Patient Active Problem List    Diagnosis Date Noted   Upper airway cough syndrome 02/22/2024   Dysfunction of right eustachian tube 01/23/2024   History of pneumonia 01/23/2024   Diuretic-induced hypokalemia 03/31/2020   Sciatica of right side 05/01/2018   Piriformis syndrome of left side 11/27/2017   GERD with esophagitis 08/22/2017   Asthmatic bronchitis 08/22/2017   Prediabetes 02/02/2017   Pure hyperglyceridemia 02/02/2017   Degenerative cervical disc 01/26/2016   Degenerative lumbar disc 01/26/2016   Spinal stenosis of lumbar region with radiculopathy 01/20/2016   Routine general medical examination at a health care facility 07/18/2014   Hereditary and idiopathic peripheral neuropathy 01/31/2008   Hyperlipidemia LDL goal <130 12/05/2007   Primary hypertension 12/05/2007   Past Medical History:  Diagnosis Date   Anxiety    Anxiety disorder    Concussion Dec 13th, 2011   fell, striking her head against a wall, developed dizziness - UC eval c/w concussion. Had follow-up and did ok.    History of chest pain    Hyperlipidemia    Hypertension    Plantar fasciitis    Renal stone    Spinal stenosis, lumbar    Family History  Problem Relation Age of Onset   Cancer Mother    Coronary artery disease Father    Past Surgical History:  Procedure Laterality Date  ARTHRODESIS METATARSALPHALANGEAL JOINT (MTPJ) Right 10/08/2015   Procedure: RIGHT HALLUX METATARSAL PHALANGEAL JOINT  ARTHRODESIS;  Surgeon: Norleen Armor, MD;  Location: [facility];  Service: Orthopedics;  Laterality: Right;   caesarean section     CESAREAN SECTION     HAMMERTOE RECONSTRUCTION WITH WEIL OSTEOTOMY Right 10/08/2015   Procedure: RIGHT SECOND METATARSAL WEIL HAMMERTOE CORRECTION;  Surgeon: Norleen Armor, MD;  Location: [facility];  Service: Orthopedics;  Laterality: Right;   TONSILLECTOMY     Social History   Occupational History   Not on file  Tobacco Use   Smoking status: Former   Smokeless tobacco:  Never   Tobacco comments:    only in h.s.  Vaping Use   Vaping status: Never Used  Substance and Sexual Activity   Alcohol use: Yes    Comment: occ   Drug use: No   Sexual activity: Not Currently    Birth control/protection: Post-menopausal    Comment: 1st intercourse- 20, partners- 1

## 2024-04-26 NOTE — Progress Notes (Signed)
 Patient says that her right hip is feeling about the same as the left. She is here to review MRI and discuss next steps.

## 2024-04-27 MED ORDER — KETOROLAC TROMETHAMINE 30 MG/ML IJ SOLN
30.0000 mg | INTRAMUSCULAR | Status: AC | PRN
  Administered 2024-04-26: 30 mg via INTRA_ARTICULAR

## 2024-04-27 MED ORDER — BUPIVACAINE HCL 0.25 % IJ SOLN
2.0000 mL | INTRAMUSCULAR | Status: AC | PRN
Start: 2024-04-26 — End: 2024-04-26
  Administered 2024-04-26: 2 mL via INTRA_ARTICULAR

## 2024-04-27 MED ORDER — LIDOCAINE HCL 1 % IJ SOLN
2.0000 mL | INTRAMUSCULAR | Status: AC | PRN
  Administered 2024-04-26: 2 mL

## 2024-04-27 MED ORDER — BUPIVACAINE HCL 0.25 % IJ SOLN
2.0000 mL | INTRAMUSCULAR | Status: AC | PRN
  Administered 2024-04-26: 2 mL via INTRA_ARTICULAR

## 2024-04-27 NOTE — Patient Instructions (Signed)
 Dr. Burnetta' Instructions and What to Expect for PRP Injections:  Platelet-rich plasma is used in musculoskeletal medicine to focus your own body's ability to heal. It has several well-done published research trials (RCT) which demonstrate both its effectiveness and safety in many musculoskeletal conditions, including osteoarthritis, tendinopathies, partial tendon tears, and damaged vertebral discs. PRP has been in clinical use since the 1990's. Many people know that platelets form a clot if there is a cut in the skin. It turns out that platelets do not only form a clot, but they also start the body's own repair process. When platelets activate to form a clot, they release alpha granules which have hundreds of chemical messengers in them that initiate and organize repair to the damaged tissue. Precisely placing PRP into the site of injury will initiate the healing process by activating on the damaged cartilage, bone or tendon. This is an inflammatory process, and inflammation is the vital first phase of healing.  What to expect and how to prepare for PRP:   Depending on the procedure, you may need to arrange for a driver to bring you home (i.e. procedure on right/driving leg). IF you are having a lower extremity procedure, we can provide crutches as needed, but these are not routinely needed.   10 days prior to the procedure: Stop taking anti-inflammatory drugs like Ibuprofen/Motrin, Advil, Naprosyn, Celebrex , or Meloxicam . Even aspirin should be stopped (but need to discuss this with Dr. Burnetta and your cardiologist beforehand). Let Dr. Burnetta know if you have been taking prednisone  or other corticosteroids in the last 30 days as this can negatively interfere with the PRP process.   The day before the procedure: thoroughly shower and clean your skin.    The day of the procedure: Wear loose-fitting clothing like sweatpants or shorts. If you are having an upper body procedure wear a top that can button or  zip up. Please show up at least 15 minutes early for your appointment, as we will need to take you back to draw your blood prior to the procedure.    Things to avoid prior to procedure: tobacco/nicotine, alcohol, fatty foods. Tobacco is a potent toxin and its use constricts small blood vessels which are needed for tissue repair. Tobacco/nicotine use will limit the effectiveness of any treatment and stopping tobacco use is one of the single greatest actions you can take to improve your health. Avoid toxins like alcohol, which inhibits and depresses the cells needed for tissue repair.  PRP will initiate healing and a productive inflammation, and PRP therapy will make the body part treated sore for the first 3-4 days up to two weeks. Anti-inflammatory drugs (i.e. ibuprofen, Naprosyn, Celebrex ) and corticosteroids such as prednisone  can blunt or stop this process, so it is important to not take any anti-inflammatory drugs for 10 days before getting PRP therapy, or for at least two weeks after PRP therapy.   Depending on the body part injected, you may be in a sling or on crutches for several days. Most of the time these are not needed, but it is important to allow time for the body part to rest after the injection. By keeping the body part treated relaxed and not loading the body part the PRP can bind in place and do its job. If you push the body part too early, this may make the PRP less effective.  What happens during the PRP procedure?  Platelet rich plasma is made by taking some of your blood and performing a  two-stage centrifuge process on it to concentrate the PRP. First, your blood is drawn into a syringe with a small amount of anti-coagulant in it (this is to keep the blood from clotting during this process). The amount of blood drawn is usually about 10-30 milliliters, depending on how much PRP is needed for the treatment. Then the blood is transferred in a sterile fashion into a centrifuge tube. It  is then centrifuged for the first cycle where the red blood cells are isolated and discarded. In the second centrifuge cycle, the platelet-rich fraction of the remaining plasma is concentrated and placed in a syringe. The skin at the injection site is numbed with a small amount of topical cooling spray. Dr. Burnetta will then precisely inject the PRP into the injury site using ultrasound guidance.  What to do after your procedure:   NO anti-inflammatories (Ibuprofen/Motrin, Aleve, Meloxicam , etc.) for 2 weeks after injection. It is OK to use Tylenol  only if needed. I can also prescribe you specific medicine to control any discomfort you may have after the procedure if needed.   NO applying ice to the affected area for 2 weeks after the injection. It is OK to use heat.   Rest the affected body part. By keeping the body part treated relaxed and not loading the body part the PRP can bind in place and do its job. Be sure to ask Dr. Burnetta specific post-injection rest and guidelines to follow following your injection, as these will differ slightly depending on what type of PRP injection you receive. In general:   Allow 3-4 days of rest from physical labor or repetitive activity for the affected body part. It is ok to move the area, but no lifting or strenuous activity. After 3 days, unless otherwise instructed, the treated body part should be used and slowly moved through its full range of motion. It will be sore, but you will not damage it by moving it, in fact it needs to move to heal.   No strenuous activity, lifting weights, or dedicated therapy until the 2-week mark from the injection. After 2 weeks from the injection, this is when it is most advantageous to start physical therapy.   After two weeks, you may begin returning to activity, but it is a good idea to avoid activities that specifically hurt you before being treated. Exercise is vital to good health and finding a way to cross train around your  injury is important not only for your physical health, but your mental health as well. Ask me about cross training options for your injury. Some brief (10 minutes or less) period of heat therapy will not hurt the rehab, but it is not required. Usually, depending on the initial injury, physical therapy is started from two weeks to three weeks after injection. Improvements in pain and function should be expected from 6 weeks to 12 weeks after injection and some injuries may require more than one treatment.

## 2024-05-10 ENCOUNTER — Encounter: Payer: Self-pay | Admitting: Sports Medicine

## 2024-05-10 ENCOUNTER — Ambulatory Visit (INDEPENDENT_AMBULATORY_CARE_PROVIDER_SITE_OTHER): Admitting: Sports Medicine

## 2024-05-10 DIAGNOSIS — M7062 Trochanteric bursitis, left hip: Secondary | ICD-10-CM | POA: Diagnosis not present

## 2024-05-10 DIAGNOSIS — S76012A Strain of muscle, fascia and tendon of left hip, initial encounter: Secondary | ICD-10-CM | POA: Diagnosis not present

## 2024-05-10 DIAGNOSIS — M67951 Unspecified disorder of synovium and tendon, right thigh: Secondary | ICD-10-CM

## 2024-05-10 DIAGNOSIS — M25551 Pain in right hip: Secondary | ICD-10-CM | POA: Diagnosis not present

## 2024-05-10 DIAGNOSIS — G8929 Other chronic pain: Secondary | ICD-10-CM

## 2024-05-10 DIAGNOSIS — M25552 Pain in left hip: Secondary | ICD-10-CM

## 2024-05-10 NOTE — Progress Notes (Signed)
 Patient says that she did not notice any relief from the injections. She feels about the same today as she did at her last visit. She says that her daughter-in-law was diagnosed with breast cancer about a week and a half ago, so her travel plans have changed. She would also like to discuss all options for pain relief as soon as possible, as she would like to feel well to help her daughter-in-law as she is going through treatment. She is unsure of the exact timeline, but says they will be moving quickly for her to have surgery. She is currently taking Celebrex . She is inquiring again about PRP versus other treatment options, including shockwave therapy and physical therapy.

## 2024-05-10 NOTE — Progress Notes (Signed)
 Kimberly Cox - 78 y.o. female MRN [id number]  Date of birth: 1945/11/22  Office Visit Note: Visit Date: 05/10/2024 PCP: Sebastian Beverley NOVAK, MD Referred by: Sebastian Beverley NOVAK, MD  Subjective: Chief Complaint  Patient presents with   Left Hip - Follow-up   Right Shoulder - Follow-up   HPI: Kimberly Cox is a pleasant 78 y.o. female who presents today for follow-up of bilateral lateral hip pain.  Last month we did proceed with ultrasound-guided trochanteric bursa injections with Toradol  to reduce her corticosteroid burden.  Unfortunately she did not notice much relief from this.  Her left lateral hip is certainly still worse although she does have some discomfort on the right side.  She unfortunately has had some medical issues returns with a family member and so would like to discuss regimented treatment for her hips so she can be feeling well to help care for them.  She continues on Celebrex  100 mg twice daily which is helpful.  Pertinent ROS were reviewed with the patient and found to be negative unless otherwise specified above in HPI.   Assessment & Plan: Visit Diagnoses:  1. Chronic hip pain, bilateral   2. Trochanteric bursitis, left hip   3. Tear of left gluteus minimus tendon, initial encounter   4. Tendinopathy of right gluteus medius    Plan: Impression is chronic left > right lateral hip pain which does have MRI confirmed high-grade tendinosis with partial tearing of the left and right gluteus minimus tendon.  The left hip does have a small reciprocal trochanteric bursitis.  She has received good relief from corticosteroid injection in the past, although we discussed this is not a great long-term option.  Given her relief, she is interested in proceeding with PRP injection therapy for the left hip first and then possibly the right.  Given her family health concerns, we will schedule this at a time that works for her schedule.  In the interim, she would like to proceed with  extracorporeal shockwave therapy as she did receive good relief from this last year on the right hip.  We did perform this for both the left and right hip today.  For now, she may continue Celebrex  100 mg twice daily, we did discuss how she would need to be off of this for 10 days prior to PRP and 2 weeks following this.  She will continue her nitroglycerin  patch 0.2 mg/h for both the left and right hip to help augment tendon healing.  I will see her back over the next 1-2 weeks for repeat eval and shockwave treatment.  Meds & Orders: No orders of the defined types were placed in this encounter.  No orders of the defined types were placed in this encounter.    Procedures: Procedure: ECSWT Indications:  Gluteus minimus/medius tendinopathy   Procedure Details Consent: Risks of procedure as well as the alternatives and risks of each were explained to the patient.  Verbal consent for procedure obtained. Time Out: Verified patient identification, verified procedure, site was marked, verified correct patient position. The area was cleaned with alcohol swab.     The left GT and gluteus tendons were targeted for Extracorporeal shockwave therapy.    Preset: Trochanteric bursitis/lateral hip pain Power Level: 110  mJ  Frequency: 12 Hz Impulse/cycles: 2200 Head size: Regular  The right GT and gluteus tendons were targeted for Extracorporeal shockwave therapy.    Preset: Trochanteric bursitis/lateral hip pain Power Level: 110  mJ  Frequency: 12 Hz  Impulse/cycles: 2400 Head size: Regular   Patient tolerated procedure well without immediate complications.      Clinical History: No specialty comments available.  She reports that she has quit smoking. She has never used smokeless tobacco. No results for input(s): HGBA1C, LABURIC in the last 8760 hours.  Objective:  Physical Exam  Gen: Well-appearing, in no acute distress; non-toxic CV: Well-perfused. Warm.  Resp: Breathing unlabored on  room air; no wheezing. Psych: Fluid speech in conversation; appropriate affect; normal thought process  Ortho Exam - Bilateral hips: + TTP over the greater trochanteric region with likely a small degree of bursitis/soft tissue swelling.  No bursitis or swelling over the right greater trochanter and minimal TTP but still present.  Pain with hip abduction on the left.  Imaging:  MR Hip Left w/o contrast CLINICAL DATA:  Left hip pain and tenderness for 2 years  EXAM: MR OF THE LEFT HIP WITHOUT CONTRAST  TECHNIQUE: Multiplanar, multisequence MR imaging was performed. No intravenous contrast was administered.  COMPARISON:  None Available.  FINDINGS: Bones:  No hip fracture, dislocation or avascular necrosis.  No periosteal reaction or bone destruction. No aggressive osseous lesion.  Mild osteoarthritis of the left SI joint. No SI joint widening or erosive changes.  Severe disc height loss at L4-5.  Articular cartilage and labrum  Articular cartilage: Mild partial eschars loss of the left femoral head and acetabulum.  Labrum:  Left labral degeneration and superior labral tear.  Joint or bursal effusion  Joint effusion:  No hip joint effusion.  No SI joint effusion.  Bursae: Moderate amount of fluid in the left greater trochanteric bursa.  Muscles and tendons  Flexors: Normal.  Extensors: Normal.  Abductors: Normal.  Adductors: Normal.  Gluteals: High-grade partial is tear of the left gluteus minimus tendon insertion. Mild tendinosis of the left gluteus medius tendon insertion with a small full-thickness tear along the anterior aspect.  Hamstrings: Normal.  Other findings  No pelvic free fluid. No fluid collection or hematoma. No inguinal lymphadenopathy. No inguinal hernia.  IMPRESSION: 1. High-grade partial is tear of the left gluteus minimus tendon insertion. 2. Mild tendinosis of the left gluteus medius tendon insertion with a small full-thickness  tear along the anterior aspect. 3. Mild osteoarthritis of the left hip. Left labral degeneration and superior labral tear. 4. Left greater trochanteric bursitis.  Electronically Signed   By: Julaine Blanch M.D.   On: 04/20/2024 09:56    Past Medical/Family/Surgical/Social History: Medications & Allergies reviewed per EMR, new medications updated. Patient Active Problem List   Diagnosis Date Noted   Upper airway cough syndrome 02/22/2024   Dysfunction of right eustachian tube 01/23/2024   History of pneumonia 01/23/2024   Diuretic-induced hypokalemia 03/31/2020   Sciatica of right side 05/01/2018   Piriformis syndrome of left side 11/27/2017   GERD with esophagitis 08/22/2017   Asthmatic bronchitis 08/22/2017   Prediabetes 02/02/2017   Pure hyperglyceridemia 02/02/2017   Degenerative cervical disc 01/26/2016   Degenerative lumbar disc 01/26/2016   Spinal stenosis of lumbar region with radiculopathy 01/20/2016   Routine general medical examination at a health care facility 07/18/2014   Hereditary and idiopathic peripheral neuropathy 01/31/2008   Hyperlipidemia LDL goal <130 12/05/2007   Primary hypertension 12/05/2007   Past Medical History:  Diagnosis Date   Anxiety    Anxiety disorder    Concussion Dec 13th, 2011   fell, striking her head against a wall, developed dizziness - UC eval c/w concussion. Had follow-up and did  ok.    History of chest pain    Hyperlipidemia    Hypertension    Plantar fasciitis    Renal stone    Spinal stenosis, lumbar    Family History  Problem Relation Age of Onset   Cancer Mother    Coronary artery disease Father    Past Surgical History:  Procedure Laterality Date   ARTHRODESIS METATARSALPHALANGEAL JOINT (MTPJ) Right 10/08/2015   Procedure: RIGHT HALLUX METATARSAL PHALANGEAL JOINT  ARTHRODESIS;  Surgeon: Norleen Armor, MD;  Location: [facility];  Service: Orthopedics;  Laterality: Right;   caesarean section     CESAREAN  SECTION     HAMMERTOE RECONSTRUCTION WITH WEIL OSTEOTOMY Right 10/08/2015   Procedure: RIGHT SECOND METATARSAL WEIL HAMMERTOE CORRECTION;  Surgeon: Norleen Armor, MD;  Location: [facility];  Service: Orthopedics;  Laterality: Right;   TONSILLECTOMY     Social History   Occupational History   Not on file  Tobacco Use   Smoking status: Former   Smokeless tobacco: Never   Tobacco comments:    only in h.s.  Vaping Use   Vaping status: Never Used  Substance and Sexual Activity   Alcohol use: Yes    Comment: occ   Drug use: No   Sexual activity: Not Currently    Birth control/protection: Post-menopausal    Comment: 1st intercourse- 20, partners- 1

## 2024-05-11 ENCOUNTER — Other Ambulatory Visit: Payer: Self-pay | Admitting: Sports Medicine

## 2024-05-17 ENCOUNTER — Encounter: Payer: Self-pay | Admitting: Sports Medicine

## 2024-05-17 ENCOUNTER — Ambulatory Visit (INDEPENDENT_AMBULATORY_CARE_PROVIDER_SITE_OTHER): Admitting: Sports Medicine

## 2024-05-17 DIAGNOSIS — M67951 Unspecified disorder of synovium and tendon, right thigh: Secondary | ICD-10-CM | POA: Diagnosis not present

## 2024-05-17 DIAGNOSIS — M25551 Pain in right hip: Secondary | ICD-10-CM

## 2024-05-17 DIAGNOSIS — M67952 Unspecified disorder of synovium and tendon, left thigh: Secondary | ICD-10-CM | POA: Diagnosis not present

## 2024-05-17 DIAGNOSIS — M25552 Pain in left hip: Secondary | ICD-10-CM

## 2024-05-17 DIAGNOSIS — M7062 Trochanteric bursitis, left hip: Secondary | ICD-10-CM | POA: Diagnosis not present

## 2024-05-17 DIAGNOSIS — G8929 Other chronic pain: Secondary | ICD-10-CM

## 2024-05-17 NOTE — Progress Notes (Signed)
 Patient says that her left hip is feeling about the same, although her right hip may be feeling a bit better. She says it is hard to tell, as she does feel she walks with a limp due to her pain on the left, but will pay closer attention to the degree of improvement on the right moving forward.

## 2024-05-17 NOTE — Progress Notes (Signed)
 Kimberly Cox - 78 y.o. female MRN [id number]  Date of birth: [birth year]  Office Visit Note: Visit Date: 05/17/2024 PCP: Sebastian Beverley NOVAK, MD Referred by: Sebastian Beverley NOVAK, MD  Subjective: Chief Complaint  Patient presents with   Left Hip - Follow-up   Right Hip - Follow-up   HPI: Kimberly Cox is a pleasant 78 y.o. female who presents today for follow-up of bilateral lateral hip pain.  We did perform our first consecutive shockwave treatment trial for both greater trochanteric regions and gluteal tendons last week.  She did feel some improvement on the right hip, the left hip feels about the same.  She does feel she has a gait change/mild limp secondary to the pain on the left.  She has done both PT and HEP in the past but we have held on this given her pain until she gets further improvement from her treatments.  She is continuing her nitroglycerin  patch 0.2 mg/h for both the right and left hip/gluteal insertion.  Pain is somewhat controlled with Celebrex  100 mg daily.   Pertinent ROS were reviewed with the patient and found to be negative unless otherwise specified above in HPI.   Assessment & Plan: Visit Diagnoses:  1. Chronic hip pain, bilateral   2. Tendinopathy of right gluteus medius   3. Tendinopathy of left gluteus medius   4. Trochanteric bursitis, left hip    Plan: Impression is chronic bilateral lateral hip pain with MRI confirmed severe right and left gluteus tendinosis with partial-thickness tearing.  Both are bothersome, but her left hip is more significant than her right.  We did repeat our second trial of extracorporeal shockwave therapy.  I would like to do 1 additional treatments and then see what sort of cumulative benefit she is having before deciding on additional treatments.  We are planning to schedule PRP injection therapy for the left hip.  In the interim, she will continue Celebrex  100 mg once daily, but we did discuss she would need to stop this for  10 days prior and 2 weeks after scheduled PRP injection.  Did recommend creatinine 5 g daily to help with muscle breakdown prevention as well as strength-based training.  For her severe tendinosis and undersurface tearing, she will continue her nitroglycerin  patch 0.2 mg/h for both the right and left hip, changing once daily.  I will see her back over the next 1-2 weeks for repeat treatment and further evaluation.  Meds & Orders: No orders of the defined types were placed in this encounter.  No orders of the defined types were placed in this encounter.   Procedures: Procedure: ECSWT Indications:  Gluteus minimus/medius tendinopathy   Procedure Details Consent: Risks of procedure as well as the alternatives and risks of each were explained to the patient.  Verbal consent for procedure obtained. Time Out: Verified patient identification, verified procedure, site was marked, verified correct patient position. The area was cleaned with alcohol swab.     The left GT and gluteus tendons were targeted for Extracorporeal shockwave therapy.    Preset: Trochanteric bursitis/lateral hip pain Power Level: 120 mJ  Frequency: 13 Hz Impulse/cycles: 2600 Head size: Regular   The right GT and gluteus tendons were targeted for Extracorporeal shockwave therapy.    Preset: Trochanteric bursitis/lateral hip pain Power Level: 120  mJ  Frequency: 13 Hz Impulse/cycles: 2600 Head size: Regular   Patient tolerated procedure well without immediate complications.       Clinical History: No  specialty comments available.  She reports that she has quit smoking. She has never used smokeless tobacco. No results for input(s): HGBA1C, LABURIC in the last 8760 hours.  Objective:    Physical Exam  Gen: Well-appearing, in no acute distress; non-toxic CV: Well-perfused. Warm.  Resp: Breathing unlabored on room air; no wheezing. Psych: Fluid speech in conversation; appropriate affect; normal thought  process  Ortho Exam - Bilateral hips: Mild TTP over the left greater trochanteric region, minimal on the right today.  No significant swelling or bursitis appreciated.  Patient does ambulate with a mild Trendelenburg gait to the right side.  Imaging: No results found.  Past Medical/Family/Surgical/Social History: Medications & Allergies reviewed per EMR, new medications updated. Patient Active Problem List   Diagnosis Date Noted   Upper airway cough syndrome 02/22/2024   Dysfunction of right eustachian tube 01/23/2024   History of pneumonia 01/23/2024   Diuretic-induced hypokalemia 03/31/2020   Sciatica of right side 05/01/2018   Piriformis syndrome of left side 11/27/2017   GERD with esophagitis 08/22/2017   Asthmatic bronchitis 08/22/2017   Prediabetes 02/02/2017   Pure hyperglyceridemia 02/02/2017   Degenerative cervical disc 01/26/2016   Degenerative lumbar disc 01/26/2016   Spinal stenosis of lumbar region with radiculopathy 01/20/2016   Routine general medical examination at a health care facility 07/18/2014   Hereditary and idiopathic peripheral neuropathy 01/31/2008   Hyperlipidemia LDL goal <130 12/05/2007   Primary hypertension 12/05/2007   Past Medical History:  Diagnosis Date   Anxiety    Anxiety disorder    Concussion Dec 13th, 2011   fell, striking her head against a wall, developed dizziness - UC eval c/w concussion. Had follow-up and did ok.    History of chest pain    Hyperlipidemia    Hypertension    Plantar fasciitis    Renal stone    Spinal stenosis, lumbar    Family History  Problem Relation Age of Onset   Cancer Mother    Coronary artery disease Father    Past Surgical History:  Procedure Laterality Date   ARTHRODESIS METATARSALPHALANGEAL JOINT (MTPJ) Right 10/08/2015   Procedure: RIGHT HALLUX METATARSAL PHALANGEAL JOINT  ARTHRODESIS;  Surgeon: Norleen Armor, MD;  Location: [facility];  Service: Orthopedics;  Laterality: Right;    caesarean section     CESAREAN SECTION     HAMMERTOE RECONSTRUCTION WITH WEIL OSTEOTOMY Right 10/08/2015   Procedure: RIGHT SECOND METATARSAL WEIL HAMMERTOE CORRECTION;  Surgeon: Norleen Armor, MD;  Location: [facility];  Service: Orthopedics;  Laterality: Right;   TONSILLECTOMY     Social History   Occupational History   Not on file  Tobacco Use   Smoking status: Former   Smokeless tobacco: Never   Tobacco comments:    only in h.s.  Vaping Use   Vaping status: Never Used  Substance and Sexual Activity   Alcohol use: Yes    Comment: occ   Drug use: No   Sexual activity: Not Currently    Birth control/protection: Post-menopausal    Comment: 1st intercourse- 20, partners- 1

## 2024-05-20 DIAGNOSIS — Z85828 Personal history of other malignant neoplasm of skin: Secondary | ICD-10-CM | POA: Diagnosis not present

## 2024-05-20 DIAGNOSIS — L570 Actinic keratosis: Secondary | ICD-10-CM | POA: Diagnosis not present

## 2024-05-20 DIAGNOSIS — L821 Other seborrheic keratosis: Secondary | ICD-10-CM | POA: Diagnosis not present

## 2024-05-20 DIAGNOSIS — L814 Other melanin hyperpigmentation: Secondary | ICD-10-CM | POA: Diagnosis not present

## 2024-05-20 DIAGNOSIS — D692 Other nonthrombocytopenic purpura: Secondary | ICD-10-CM | POA: Diagnosis not present

## 2024-05-21 ENCOUNTER — Ambulatory Visit (INDEPENDENT_AMBULATORY_CARE_PROVIDER_SITE_OTHER): Admitting: Otolaryngology

## 2024-05-21 ENCOUNTER — Ambulatory Visit (INDEPENDENT_AMBULATORY_CARE_PROVIDER_SITE_OTHER): Admitting: Audiology

## 2024-05-21 ENCOUNTER — Encounter (INDEPENDENT_AMBULATORY_CARE_PROVIDER_SITE_OTHER): Payer: Self-pay | Admitting: Otolaryngology

## 2024-05-21 VITALS — BP 127/77 | HR 82

## 2024-05-21 DIAGNOSIS — H6981 Other specified disorders of Eustachian tube, right ear: Secondary | ICD-10-CM | POA: Diagnosis not present

## 2024-05-21 DIAGNOSIS — H903 Sensorineural hearing loss, bilateral: Secondary | ICD-10-CM | POA: Diagnosis not present

## 2024-05-21 DIAGNOSIS — H6121 Impacted cerumen, right ear: Secondary | ICD-10-CM

## 2024-05-21 MED ORDER — FLUTICASONE PROPIONATE 50 MCG/ACT NA SUSP: 2.0000 | Freq: Every day | NASAL | 10 refills | Status: AC

## 2024-05-21 NOTE — Progress Notes (Unsigned)
  [address] [place], [place] [address] [phone]  Audiological Evaluation    Name: [name]     DOB:   16-Oct-1945      MRN:   [id number]                                                                                     Service Date: 05/21/2024     Accompanied by: ***   Patient comes today after Dr. Karis, ENT sent a referral for a hearing evaluation due to concerns with hearing loss asymmetry.   Symptoms Yes Details  Hearing loss  []    Tinnitus  []    Ear pain/ infections/pressure  []    Balance problems  []    Noise exposure history  []    Previous ear surgeries  []    Family history of hearing loss  []    Amplification  []    Other  []      Otoscopy: Right ear: {otoscopy:31227} Left ear:  {otoscopy:31227}  Tympanometry: Right ear: {tympanometry results:31367}. Left ear: {tympanometry results:31367}.  Distortion Product Otoacoustic Emissions: Frequencies tested 1.6-8kHz - Diagnostic Test (12 frequencies)   Results are considered present if DP-NF is above 6dB and DP isabove -10dB. Right ear: {DPOAE's:31368::Present from 1600-8000 Hz which is suggestive of normal outer hair cell function.} Left ear: {DPOAE's:31368::Present from 1600-8000 Hz which is suggestive of normal outer hair cell function.}  Pure tone Audiometry: Right ear- *** {hearing loss types:31372::sensorineural hearing loss} from *** Hz - *** Hz. Left ear-  *** {hearing loss types:31372::sensorineural hearing loss} from *** Hz - *** Hz.  Speech Audiometry: Right ear- {AUD SRT/SAT:19348}. Left ear-{AUD SRT/SAT:19348}.   Word Recognition Score Tested using {word lists:31376::NU-6 (recorded)} Right ear: ***% was obtained at a presentation level of *** dBHL with contralateral masking which is deemed as  {word recognition score:31373}. Left ear: ***% was obtained at a presentation level of *** dBHL with contralateral masking which is deemed as  {word recognition score:31373}.    The hearing test results were completed {transducer options:31388} and results are deemed to be of {test reliability:31390::good reliability}. Test technique:  {audiometric test technique:31400::conventional}    Impression: {Word recognition Score interpretation:31432::There is not a significant difference in pure-tone thresholds between ears.,There is not a significant difference in the word recognition score in between ears. }   Recommendations: {Audiology Recommendations:31370::Follow up with ENT as scheduled for today.}   Xzavian Semmel MARIE LEROUX-MARTINEZ, AUD

## 2024-05-22 DIAGNOSIS — H6121 Impacted cerumen, right ear: Secondary | ICD-10-CM | POA: Insufficient documentation

## 2024-05-22 DIAGNOSIS — H903 Sensorineural hearing loss, bilateral: Secondary | ICD-10-CM | POA: Insufficient documentation

## 2024-05-22 NOTE — Progress Notes (Signed)
 CC: Clogging sensation in the right ear, muffled hearing  HPI:  Kimberly Cox is a 78 y.o. female who presents today complaining of clogging sensation in her right ear and muffled hearing.  Her symptoms started in March 2025, when she was diagnosed with bronchitis and pneumonia.  She was treated with antibiotics.  Her acute infection has resolved.  However, she has a persistent clogging sensation in the right ear.  She was subsequently treated with azithromycin , Flonase , and inhaler.  She denies any known environmental allergies.  She has no recent otitis media or otitis externa.  She has no previous ENT surgery.  Past Medical History:  Diagnosis Date   Anxiety    Anxiety disorder    Concussion Dec 13th, 2011   fell, striking her head against a wall, developed dizziness - UC eval c/w concussion. Had follow-up and did ok.    History of chest pain    Hyperlipidemia    Hypertension    Plantar fasciitis    Renal stone    Spinal stenosis, lumbar     Past Surgical History:  Procedure Laterality Date   ARTHRODESIS METATARSALPHALANGEAL JOINT (MTPJ) Right 10/08/2015   Procedure: RIGHT HALLUX METATARSAL PHALANGEAL JOINT  ARTHRODESIS;  Surgeon: Norleen Armor, MD;  Location: [facility];  Service: Orthopedics;  Laterality: Right;   caesarean section     CESAREAN SECTION     HAMMERTOE RECONSTRUCTION WITH WEIL OSTEOTOMY Right 10/08/2015   Procedure: RIGHT SECOND METATARSAL WEIL HAMMERTOE CORRECTION;  Surgeon: Norleen Armor, MD;  Location: [facility];  Service: Orthopedics;  Laterality: Right;   TONSILLECTOMY      Family History  Problem Relation Age of Onset   Cancer Mother    Coronary artery disease Father     Social History:  reports that she has quit smoking. She has never used smokeless tobacco. She reports current alcohol use. She reports that she does not use drugs.  Allergies:  Allergies  Allergen Reactions   Qc Tumeric Complex [Turmeric] Diarrhea   Omnicef   [Cefdinir ] Itching    Prior to Admission medications   Medication Sig Start Date End Date Taking? Authorizing Provider  fluticasone  (FLONASE ) 50 MCG/ACT nasal spray Place 2 sprays into both nostrils daily. 05/21/24 06/20/24 Yes Karis Clunes, MD  HYDROcodone -acetaminophen  (NORCO/VICODIN) 5-325 MG tablet Take 1 tablet by mouth every 6 (six) hours as needed for moderate pain (pain score 4-6). 04/04/24   Brooks, Dana, DO  budesonide -formoterol  (SYMBICORT ) 160-4.5 MCG/ACT inhaler Inhale 2 puffs into the lungs 2 (two) times daily. 04/01/24 05/01/24  Billy Knee, FNP  celecoxib  (CELEBREX ) 100 MG capsule TAKE 1 CAPSULE(100 MG) BY MOUTH TWICE DAILY BETWEEN MEALS AS NEEDED 05/13/24   Brooks, Dana, DO  clobetasol (TEMOVATE) 0.05 % external solution Apply topically daily. 09/18/22   [provider]  co-enzyme Q-10 30 MG capsule Take 30 mg by mouth 3 (three) times daily. Pt is taking 200MG     [provider]  irbesartan-hydrochlorothiazide (AVALIDE) 150-12.5 MG tablet Take 1 tablet by mouth daily. 09/19/23   [provider]  nitroGLYCERIN  (NITRODUR - DOSED IN MG/24 HR) 0.2 mg/hr patch Cut patch into fourths. Apply 1/4 patch over affected area on lateral hip. Change once every 24 hours. 03/18/24   Brooks, Dana, DO  omeprazole  (PRILOSEC) 20 MG capsule Take 1 capsule (20 mg total) by mouth daily. 01/23/24 01/17/25  Sebastian Beverley NOVAK, MD  rosuvastatin (CRESTOR) 5 MG tablet Take 5 mg by mouth daily.  01/17/17   [provider]    Blood pressure 127/77, pulse 82, SpO2 95%. Exam: General: Communicates without difficulty, well nourished, no acute distress. Head: Normocephalic, no evidence injury, no tenderness, facial buttresses intact without stepoff. Face/sinus: No tenderness to palpation and percussion. Facial movement is normal and symmetric. Eyes: PERRL, EOMI. No scleral icterus, conjunctivae clear. Neuro: CN II exam reveals vision grossly intact.  No nystagmus at any point of gaze. Ears:  Auricles well formed without lesions.  Right ear cerumen impaction.  Nose: External evaluation reveals normal support and skin without lesions.  Dorsum is intact.  Anterior rhinoscopy reveals congested mucosa over anterior aspect of inferior turbinates and intact septum.  No purulence noted. Oral:  Oral cavity and oropharynx are intact, symmetric, without erythema or edema.  Mucosa is moist without lesions. Neck: Full range of motion without pain.  There is no significant lymphadenopathy.  No masses palpable.  Thyroid  bed within normal limits to palpation.  Parotid glands and submandibular glands equal bilaterally without mass.  Trachea is midline. Neuro:  CN 2-12 grossly intact.   Procedure: Right ear cerumen disimpaction Anesthesia: None Description: Under the operating microscope, the cerumen is carefully removed with a combination of cerumen currette, alligator forceps, and suction catheters.  After the cerumen is removed, the TMs are noted to be normal.  No mass, erythema, or lesions. The patient tolerated the procedure well.    Her hearing test shows bilateral high-frequency sensorineural hearing loss, slightly worse on the right side at low frequencies.  Assessment: 1.  Incidental finding of right ear cerumen impaction.  After the cerumen disimpaction procedure, both tympanic membranes and middle ear spaces are noted to be normal. 2.  Her history is suggestive of right ear eustachian tube dysfunction. 3.  Bilateral high-frequency sensorineural hearing loss, slightly worse on the right side at low frequencies.  Plan: 1.  Otomicroscopy with right ear cerumen disimpaction. 2.  The physical exam findings and the hearing test results are reviewed with the patient. 3.  Flonase  nasal spray 2 sprays each nostril daily.  The importance of consistent daily use is discussed. 4.  Valsalva exercise multiple times a day. 5.  The patient will return for reevaluation in 6 weeks.  If her hearing asymmetry  worsens, she may benefit from a brain MRI scan to rule out any retrocochlear lesion.  Kensley Valladares W Luticia Tadros 05/22/2024, 11:44 AM

## 2024-05-23 ENCOUNTER — Encounter: Payer: Self-pay | Admitting: Sports Medicine

## 2024-05-23 ENCOUNTER — Ambulatory Visit: Admitting: Sports Medicine

## 2024-05-23 DIAGNOSIS — M67952 Unspecified disorder of synovium and tendon, left thigh: Secondary | ICD-10-CM

## 2024-05-23 DIAGNOSIS — M67951 Unspecified disorder of synovium and tendon, right thigh: Secondary | ICD-10-CM | POA: Diagnosis not present

## 2024-05-23 DIAGNOSIS — M25552 Pain in left hip: Secondary | ICD-10-CM

## 2024-05-23 DIAGNOSIS — G8929 Other chronic pain: Secondary | ICD-10-CM | POA: Diagnosis not present

## 2024-05-23 DIAGNOSIS — M25551 Pain in right hip: Secondary | ICD-10-CM

## 2024-05-23 DIAGNOSIS — M12811 Other specific arthropathies, not elsewhere classified, right shoulder: Secondary | ICD-10-CM

## 2024-05-23 DIAGNOSIS — M25511 Pain in right shoulder: Secondary | ICD-10-CM

## 2024-05-23 MED ORDER — BUPIVACAINE HCL 0.25 % IJ SOLN
2.0000 mL | INTRAMUSCULAR | Status: AC | PRN
  Administered 2024-05-23: 2 mL via INTRA_ARTICULAR

## 2024-05-23 MED ORDER — METHYLPREDNISOLONE ACETATE 40 MG/ML IJ SUSP
40.0000 mg | INTRAMUSCULAR | Status: AC | PRN
  Administered 2024-05-23: 40 mg via INTRA_ARTICULAR

## 2024-05-23 MED ORDER — LIDOCAINE HCL 1 % IJ SOLN
2.0000 mL | INTRAMUSCULAR | Status: AC | PRN
  Administered 2024-05-23: 2 mL

## 2024-05-23 NOTE — Progress Notes (Signed)
 Patient says that the right hip seems to be a bit better, and the left hip seems to be more painful a bit lower on the lateral hip. She says that she is also having trouble with her right shoulder again. She says that she has had returning pain over the last couple of weeks, but today especially she is having pain in the anterior shoulder and biceps.

## 2024-05-23 NOTE — Progress Notes (Signed)
 Kimberly Cox - 78 y.o. female MRN [id number]  Date of birth: 1945-09-13  Office Visit Note: Visit Date: 05/23/2024 PCP: Sebastian Beverley NOVAK, MD Referred by: Sebastian Beverley NOVAK, MD  Subjective: cv Chief Complaint  Patient presents with   Left Hip - Follow-up   Right Hip - Follow-up   HPI: Kimberly Cox is a pleasant 78 y.o. female who presents today for follow-up of bilateral lateral hip pain with gluteal tendinopathy, also with newer onset right shoulder pain.  Bilateral hips -hips in general doing ok, her right hip continues to improve with the shockwave treatment.  The left hip she has had some improvement at the hip but feeling more over the proximal IT band as well.  She still would like to move forward with PRP injection therapy for that side at a later date.  Using Celebrex  100 mg daily.  He is continuing her nitroglycerin  patch 0.2 mg/h for both the right and left hip.  Right shoulder -this recently started bothering her over the past few weeks.  Feels more pain over the anterior shoulder and with certain reaching/provocative motions.  Back in November of last year we did provide her a subacromial joint injection which gave her about 95% relief for the first month and then her pain was better but slowly started waning over the next few months.  Pertinent ROS were reviewed with the patient and found to be negative unless otherwise specified above in HPI.   Assessment & Plan: Visit Diagnoses:  1. Chronic hip pain, bilateral   2. Tendinopathy of right gluteus medius   3. Tendinopathy of left gluteus medius   4. Chronic right shoulder pain   5. Rotator cuff arthropathy of right shoulder    Plan: Impression is chronic bilateral lateral hip pain with MRI confirms severe right and left gluteus tendinosis with partial-thickness tearing.  The right hip continues making improvements with extracorporeal shockwave therapy, the left has as well but not as significant.  We did repeat  extracorporeal shockwave therapy for both hips today.  She will continue her nitroglycerin  patch 0.2 mg/h for both the right and left gluteal insertion for blood flow and tendon healing augmentation.  She will continue her Celebrex  100 mg daily in the interim.  She is set up for PRP injection therapy in October and does know she needs to stop her Celebrex  10 days prior to this procedure.  She also is dealing with an acute exacerbation of her chronic right shoulder pain which has a combination of mild to moderate arthritic change as well as a degree of likely rotator cuff arthropathy.  We did perform subacromial joint injection today, patient tolerated well.  I would like to see her back in a few weeks to see how she responded to this.  She could be a candidate for ultrasound-guided intra-articular injection for her arthritic change but will see how she does over these coming weeks.  I will see her back for follow-up in the next 2-3 weeks to check on all of the above.  Meds & Orders: No orders of the defined types were placed in this encounter.   Orders Placed This Encounter  Procedures   Large Joint Inj     Procedures:  Large Joint Inj: R subacromial bursa on 05/23/2024 11:29 AM Indications: pain Details: 22 G 1.5 in needle, posterior approach Medications: 2 mL lidocaine  1 %; 2 mL bupivacaine  0.25 %; 40 mg methylPREDNISolone  acetate 40 MG/ML Outcome: tolerated well, no immediate complications  Subacromial Joint Injection, Right Shoulder After discussion on risks/benefits/indications, informed verbal consent was obtained. A timeout was then performed. Patient was seated on table in exam room. The patient's shoulder was prepped with betadine and alcohol swabs and utilizing posterior approach a 22G, 1.5 needle was directed anteriorly and laterally into the patient's subacromial space was injected with 2:2:1 mixture of lidocaine :bupivicaine:depomedrol with appreciation of free-flowing of the injectate  into the bursal space. Patient tolerated the procedure well without immediate complications.   Procedure, treatment alternatives, risks and benefits explained, specific risks discussed. Consent was given by the patient. Immediately prior to procedure a time out was called to verify the correct patient, procedure, equipment, support staff and site/side marked as required. Patient was prepped and draped in the usual sterile fashion.      Procedure: ECSWT Indications:  Gluteus minimus/medius tendinopathy   Procedure Details Consent: Risks of procedure as well as the alternatives and risks of each were explained to the patient.  Verbal consent for procedure obtained. Time Out: Verified patient identification, verified procedure, site was marked, verified correct patient position. The area was cleaned with alcohol swab.     The left GT and gluteus tendons were targeted for Extracorporeal shockwave therapy.    Preset: Trochanteric bursitis/lateral hip pain Power Level: 120 mJ  Frequency: 13 Hz Impulse/cycles: 2750   Head size: Regular   The right GT and gluteus tendons were targeted for Extracorporeal shockwave therapy.    Preset: Trochanteric bursitis/lateral hip pain Power Level: 120  mJ  Frequency: 13 Hz Impulse/cycles: 2750 Head size: Regular   Patient tolerated procedure well without immediate complications.       Clinical History: No specialty comments available.  She reports that she has quit smoking. She has never used smokeless tobacco. No results for input(s): HGBA1C, LABURIC in the last 8760 hours.  Objective:    Physical Exam  Gen: Well-appearing, in no acute distress; non-toxic CV: Well-perfused. Warm.  Resp: Breathing unlabored on room air; no wheezing. Psych: Fluid speech in conversation; appropriate affect; normal thought process  Ortho Exam - Right shoulder: There is generalized tenderness around Codman's area in the anterior shoulder recess although no AC  joint or bony TTP.  There is about 5 degrees less of forward active flexion and abduction although no significant pain.  There is mild pain with resisted abduction and empty can testing with very minimal weakness compared to the contralateral side.  No pain with ER or IR at the 90/90 position.  - Bilateral hips: There is little tenderness over the greater trochanteric region on the right, still some tenderness over the GT and the proximal IT band on the left hip.  Imaging:  06/13/23: 3 views of the right shoulder including AP, Grashey and axial view were  ordered and reviewed by myself.  There is at least moderate AC joint  arthritic change, mild glenohumeral joint arthritis.  Humeral head is well  located within the groove.  There is no acute fracture or otherwise bony  abnormality noted.   Past Medical/Family/Surgical/Social History: Medications & Allergies reviewed per EMR, new medications updated. Patient Active Problem List   Diagnosis Date Noted   Impacted cerumen of right ear 05/22/2024   Sensorineural hearing loss, bilateral 05/22/2024   Upper airway cough syndrome 02/22/2024   Other specified disorders of eustachian tube, right ear 01/23/2024   History of pneumonia 01/23/2024   Diuretic-induced hypokalemia 03/31/2020   Sciatica of right side 05/01/2018   Piriformis syndrome of left side 11/27/2017  GERD with esophagitis 08/22/2017   Asthmatic bronchitis 08/22/2017   Prediabetes 02/02/2017   Pure hyperglyceridemia 02/02/2017   Degenerative cervical disc 01/26/2016   Degenerative lumbar disc 01/26/2016   Spinal stenosis of lumbar region with radiculopathy 01/20/2016   Routine general medical examination at a health care facility 07/18/2014   Hereditary and idiopathic peripheral neuropathy 01/31/2008   Hyperlipidemia LDL goal <130 12/05/2007   Primary hypertension 12/05/2007   Past Medical History:  Diagnosis Date   Anxiety    Anxiety disorder    Concussion Dec 13th,  2011   fell, striking her head against a wall, developed dizziness - UC eval c/w concussion. Had follow-up and did ok.    History of chest pain    Hyperlipidemia    Hypertension    Plantar fasciitis    Renal stone    Spinal stenosis, lumbar    Family History  Problem Relation Age of Onset   Cancer Mother    Coronary artery disease Father    Past Surgical History:  Procedure Laterality Date   ARTHRODESIS METATARSALPHALANGEAL JOINT (MTPJ) Right 10/08/2015   Procedure: RIGHT HALLUX METATARSAL PHALANGEAL JOINT  ARTHRODESIS;  Surgeon: Norleen Armor, MD;  Location: [facility];  Service: Orthopedics;  Laterality: Right;   caesarean section     CESAREAN SECTION     HAMMERTOE RECONSTRUCTION WITH WEIL OSTEOTOMY Right 10/08/2015   Procedure: RIGHT SECOND METATARSAL WEIL HAMMERTOE CORRECTION;  Surgeon: Norleen Armor, MD;  Location: [facility];  Service: Orthopedics;  Laterality: Right;   TONSILLECTOMY     Social History   Occupational History   Not on file  Tobacco Use   Smoking status: Former   Smokeless tobacco: Never   Tobacco comments:    only in h.s.  Vaping Use   Vaping status: Never Used  Substance and Sexual Activity   Alcohol use: Yes    Comment: occ   Drug use: No   Sexual activity: Not Currently    Birth control/protection: Post-menopausal    Comment: 1st intercourse- 20, partners- 1

## 2024-06-03 ENCOUNTER — Ambulatory Visit: Admitting: Sports Medicine

## 2024-06-06 ENCOUNTER — Encounter: Payer: Self-pay | Admitting: Obstetrics and Gynecology

## 2024-06-06 ENCOUNTER — Ambulatory Visit: Admitting: Sports Medicine

## 2024-06-06 ENCOUNTER — Ambulatory Visit (INDEPENDENT_AMBULATORY_CARE_PROVIDER_SITE_OTHER): Admitting: Obstetrics and Gynecology

## 2024-06-06 VITALS — BP 126/80 | HR 71

## 2024-06-06 DIAGNOSIS — N951 Menopausal and female climacteric states: Secondary | ICD-10-CM | POA: Diagnosis not present

## 2024-06-06 DIAGNOSIS — M2550 Pain in unspecified joint: Secondary | ICD-10-CM

## 2024-06-06 DIAGNOSIS — Z780 Asymptomatic menopausal state: Secondary | ICD-10-CM

## 2024-06-06 NOTE — Progress Notes (Signed)
 GYNECOLOGY  VISIT   HPI: 78 y.o.   Married  Caucasian female   [phone] with No LMP recorded. Patient is postmenopausal.   here for: HRT Consult.  Patient asking if HRT will treat her hip pain.       Menopause in 2002.  Dealing with bilateral hip pain due to tendinopathy for last 2 years.   Back pain for years.  Having injections.  Doing shock therapy.   Her provider suggested creatine supplement.     GYNECOLOGIC HISTORY: No LMP recorded. Patient is postmenopausal. Contraception:  PMP Menopausal hormone therapy:  n/a Last 2 paps:  09/28/21 neg, 05/03/18 neg History of abnormal Pap or positive HPV:  no Mammogram:  08/09/23 Breast Density Cat C, BIRADS Cat 1 neg         OB History     Gravida  2   Para  2   Term  1   Preterm  1   AB  0   Living  2      SAB  0   IAB      Ectopic  0   Multiple      Live Births                 Patient Active Problem List   Diagnosis Date Noted   Impacted cerumen of right ear 05/22/2024   Sensorineural hearing loss, bilateral 05/22/2024   Upper airway cough syndrome 02/22/2024   Other specified disorders of eustachian tube, right ear 01/23/2024   History of pneumonia 01/23/2024   Diuretic-induced hypokalemia 03/31/2020   Sciatica of right side 05/01/2018   Piriformis syndrome of left side 11/27/2017   GERD with esophagitis 08/22/2017   Asthmatic bronchitis 08/22/2017   Prediabetes 02/02/2017   Pure hyperglyceridemia 02/02/2017   Degenerative cervical disc 01/26/2016   Degenerative lumbar disc 01/26/2016   Spinal stenosis of lumbar region with radiculopathy 01/20/2016   Routine general medical examination at a health care facility 07/18/2014   Hereditary and idiopathic peripheral neuropathy 01/31/2008   Hyperlipidemia LDL goal <130 12/05/2007   Primary hypertension 12/05/2007    Past Medical History:  Diagnosis Date   Anxiety    Anxiety disorder    Concussion Dec 13th, 2011   fell, striking her head against a  wall, developed dizziness - UC eval c/w concussion. Had follow-up and did ok.    History of chest pain    Hyperlipidemia    Hypertension    Plantar fasciitis    Renal stone    Spinal stenosis, lumbar     Past Surgical History:  Procedure Laterality Date   ARTHRODESIS METATARSALPHALANGEAL JOINT (MTPJ) Right 10/08/2015   Procedure: RIGHT HALLUX METATARSAL PHALANGEAL JOINT  ARTHRODESIS;  Surgeon: Norleen Armor, MD;  Location: [facility];  Service: Orthopedics;  Laterality: Right;   caesarean section     CESAREAN SECTION     HAMMERTOE RECONSTRUCTION WITH WEIL OSTEOTOMY Right 10/08/2015   Procedure: RIGHT SECOND METATARSAL WEIL HAMMERTOE CORRECTION;  Surgeon: Norleen Armor, MD;  Location: [facility];  Service: Orthopedics;  Laterality: Right;   TONSILLECTOMY      Current Outpatient Medications  Medication Sig Dispense Refill   celecoxib  (CELEBREX ) 100 MG capsule TAKE 1 CAPSULE(100 MG) BY MOUTH TWICE DAILY BETWEEN MEALS AS NEEDED 60 capsule 0   clobetasol (TEMOVATE) 0.05 % external solution Apply topically daily.     co-enzyme Q-10 30 MG capsule Take 30 mg by mouth 3 (three) times daily. Pt is taking  200MG      fluticasone  (FLONASE ) 50 MCG/ACT nasal spray Place 2 sprays into both nostrils daily. 16 g 10   irbesartan-hydrochlorothiazide (AVALIDE) 150-12.5 MG tablet Take 1 tablet by mouth daily.     nitroGLYCERIN  (NITRODUR - DOSED IN MG/24 HR) 0.2 mg/hr patch Cut patch into fourths. Apply 1/4 patch over affected area on lateral hip. Change once every 24 hours. 25 patch 0   omeprazole  (PRILOSEC) 20 MG capsule Take 1 capsule (20 mg total) by mouth daily. 90 capsule 3   rosuvastatin (CRESTOR) 5 MG tablet Take 5 mg by mouth daily.   3   budesonide -formoterol  (SYMBICORT ) 160-4.5 MCG/ACT inhaler Inhale 2 puffs into the lungs 2 (two) times daily. (Patient not taking: Reported on 06/06/2024) 1 each 3   No current facility-administered medications for this visit.     ALLERGIES:  Qc tumeric complex [turmeric] and Omnicef  [cefdinir ]  Family History  Problem Relation Age of Onset   Cancer Mother    Coronary artery disease Father     Social History   Socioeconomic History   Marital status: Married    Spouse name: Not on file   Number of children: Not on file   Years of education: Not on file   Highest education level: Associate degree: occupational, Scientist, product/process development, or vocational program  Occupational History   Not on file  Tobacco Use   Smoking status: Former   Smokeless tobacco: Never   Tobacco comments:    only in h.s.  Vaping Use   Vaping status: Never Used  Substance and Sexual Activity   Alcohol use: Yes    Comment: occ   Drug use: No   Sexual activity: Not Currently    Birth control/protection: Post-menopausal    Comment: 1st intercourse- 20, partners- 1  Other Topics Concern   Not on file  Social History Narrative   HSG, community college   Married '68   1 daughter- '80, 1 son- '77; two grandchildren   Work: was a Diplomatic Services operational officer, full time homemaker   Marriage in good health   End of care: no cpr, no mechanical ventalation, no futile or heroic measures.   Social Drivers of Corporate investment banker Strain: Low Risk  (01/19/2024)   Overall Financial Resource Strain (CARDIA)    Difficulty of Paying Living Expenses: Not hard at all  Food Insecurity: No Food Insecurity (01/19/2024)   Hunger Vital Sign    Worried About Running Out of Food in the Last Year: Never true    Ran Out of Food in the Last Year: Never true  Transportation Needs: No Transportation Needs (01/19/2024)   PRAPARE - Administrator, Civil Service (Medical): No    Lack of Transportation (Non-Medical): No  Physical Activity: Unknown (01/19/2024)   Exercise Vital Sign    Days of Exercise per Week: 0 days    Minutes of Exercise per Session: Not on file  Stress: Stress Concern Present (01/19/2024)   [employer] of Occupational Health - Occupational Stress  Questionnaire    Feeling of Stress : Rather much  Social Connections: Socially Integrated (01/19/2024)   Social Connection and Isolation Panel    Frequency of Communication with Friends and Family: More than three times a week    Frequency of Social Gatherings with Friends and Family: Once a week    Attends Religious Services: More than 4 times per year    Active Member of [employer] or Organizations: Yes    Attends Banker Meetings: More  than 4 times per year    Marital Status: Married  Catering manager Violence: Not on file    Review of Systems  All other systems reviewed and are negative.   PHYSICAL EXAMINATION:   BP 126/80 (BP Location: Left Arm, Patient Position: Sitting)   Pulse 71   SpO2 96%     General appearance: alert, cooperative and appears stated age   ASSESSMENT:  Postmenopausal female.  Joint pain.   PLAN:  We reviewed HRT and indications for use, guidelines for prescribing based on postmenopausal status, and risks/benefits.  Risks may include MI, stroke, DVT, and PE.  Benefits are not likely to treat tendinopathy.  I recommend she follow up with her ortho/spine team.  FU prn.   15 min  total time was spent for this patient encounter, including preparation, face-to-face counseling with the patient, coordination of care, and documentation of the encounter.

## 2024-06-10 ENCOUNTER — Other Ambulatory Visit: Payer: Self-pay | Admitting: Sports Medicine

## 2024-06-17 ENCOUNTER — Ambulatory Visit: Admitting: Sports Medicine

## 2024-06-17 ENCOUNTER — Encounter: Payer: Self-pay | Admitting: Sports Medicine

## 2024-06-17 DIAGNOSIS — M25551 Pain in right hip: Secondary | ICD-10-CM | POA: Diagnosis not present

## 2024-06-17 DIAGNOSIS — M67952 Unspecified disorder of synovium and tendon, left thigh: Secondary | ICD-10-CM | POA: Diagnosis not present

## 2024-06-17 DIAGNOSIS — G8929 Other chronic pain: Secondary | ICD-10-CM

## 2024-06-17 DIAGNOSIS — M25552 Pain in left hip: Secondary | ICD-10-CM

## 2024-06-17 DIAGNOSIS — M67951 Unspecified disorder of synovium and tendon, right thigh: Secondary | ICD-10-CM

## 2024-06-17 DIAGNOSIS — M12811 Other specific arthropathies, not elsewhere classified, right shoulder: Secondary | ICD-10-CM

## 2024-06-17 NOTE — Progress Notes (Signed)
 Kimberly Cox - 78 y.o. female MRN [id number]  Date of birth: 13-Dec-1945  Office Visit Note: Visit Date: 06/17/2024 PCP: Sebastian Beverley NOVAK, MD Referred by: Sebastian Beverley NOVAK, MD  Subjective: Chief Complaint  Patient presents with   Left Hip - Follow-up   Right Hip - Follow-up   Right Shoulder - Follow-up   HPI: Kimberly Cox is a pleasant 78 y.o. female who presents today for follow-up of chronic bilateral hip pain, right shoulder pain.  Bilateral hips -both of her hips are doing quite well, the left is still slightly behind the right but in general she continues making good progress from extracorporeal shockwave therapy.  Hips are doing so well in fact that she does not feel she needs her PRP injection, she did cancel this.  She did complete her nitroglycerin  0.2 mg/h patch for both the hips and did just recently run out.   Right shoulder -the subacromial joint injection at last visit certainly helped reduce her pain.  Not having specific issues with her shoulder.  She is continuing her Celebrex  100 mg once to twice daily as needed which does help her overall hip and shoulder/joint pain.  Pertinent ROS were reviewed with the patient and found to be negative unless otherwise specified above in HPI.   Assessment & Plan: Visit Diagnoses:  1. Chronic hip pain, bilateral   2. Tendinopathy of right gluteus medius   3. Tendinopathy of left gluteus medius   4. Rotator cuff arthropathy of right shoulder    Plan: Impression is chronic bilateral lateral hip pain with rather significant right and left gluteal tendinosis with partial-thickness tearing.  Here after we resumed a few treatments of extracorporeal shockwave therapy, both hips have turned the corner and are making marked improvement, she would like to continue this treatment.  Extracorporeal shockwave therapy for both the right and left lateral hip was performed today.  Given that she is right at the 32-month mark from initiation of  nitroglycerin  patch, we will discontinue this going forward.  She is fine to continue her Celebrex  100 mg once to twice daily as needed.  We will plan on seeing her back for 1 additional shockwave therapy for both hips and then likely release her to home therapy regimen.  In terms of her right shoulder, she does have a combination of mild arthritic change and rotator cuff arthropathy, she did respond well to subacromial joint injection, we will follow-up with this as needed.  Follow-up: Return in about 10 days (around 06/27/2024) for Bilateral hip f/u (reg visit).   Meds & Orders:   - discontinue nitroglycerin  patch 0.2 mg/h    Procedures: Procedure: ECSWT Indications:  Gluteus minimus/medius tendinopathy   Procedure Details Consent: Risks of procedure as well as the alternatives and risks of each were explained to the patient.  Verbal consent for procedure obtained. Time Out: Verified patient identification, verified procedure, site was marked, verified correct patient position. The area was cleaned with alcohol swab.     The left GT and gluteus tendons were targeted for Extracorporeal shockwave therapy.    Preset: Trochanteric bursitis/lateral hip pain Power Level: 120 mJ  Frequency: 13 Hz Impulse/cycles: 2750   Head size: Regular   The right GT and gluteus tendons were targeted for Extracorporeal shockwave therapy.    Preset: Trochanteric bursitis/lateral hip pain Power Level: 120  mJ  Frequency: 13 Hz Impulse/cycles: 2750 Head size: Regular   Patient tolerated procedure well without immediate complications.  Clinical History: No specialty comments available.  She reports that she has quit smoking. She has never used smokeless tobacco. No results for input(s): HGBA1C, LABURIC in the last 8760 hours.  Objective:    Physical Exam  Gen: Well-appearing, in no acute distress; non-toxic CV: Well-perfused. Warm.  Resp: Breathing unlabored on room air; no  wheezing. Psych: Fluid speech in conversation; appropriate affect; normal thought process  Ortho Exam - Bilateral hips: There is improved tenderness without TTP to light touch but mildly to deep touch around the greater trochanteric region.  There is improved hip abduction strength testing although full strength testing not performed today.  Mild proximal IT band tenderness on the left side that is reciprocal, no underlying fascial restriction noted.  Imaging: No results found.  Past Medical/Family/Surgical/Social History: Medications & Allergies reviewed per EMR, new medications updated. Patient Active Problem List   Diagnosis Date Noted   Impacted cerumen of right ear 05/22/2024   Sensorineural hearing loss, bilateral 05/22/2024   Upper airway cough syndrome 02/22/2024   Other specified disorders of eustachian tube, right ear 01/23/2024   History of pneumonia 01/23/2024   Diuretic-induced hypokalemia 03/31/2020   Sciatica of right side 05/01/2018   Piriformis syndrome of left side 11/27/2017   GERD with esophagitis 08/22/2017   Asthmatic bronchitis 08/22/2017   Prediabetes 02/02/2017   Pure hyperglyceridemia 02/02/2017   Degenerative cervical disc 01/26/2016   Degenerative lumbar disc 01/26/2016   Spinal stenosis of lumbar region with radiculopathy 01/20/2016   Routine general medical examination at a health care facility 07/18/2014   Hereditary and idiopathic peripheral neuropathy 01/31/2008   Hyperlipidemia LDL goal <130 12/05/2007   Primary hypertension 12/05/2007   Past Medical History:  Diagnosis Date   Anxiety    Anxiety disorder    Concussion Dec 13th, 2011   fell, striking her head against a wall, developed dizziness - UC eval c/w concussion. Had follow-up and did ok.    History of chest pain    Hyperlipidemia    Hypertension    Plantar fasciitis    Renal stone    Spinal stenosis, lumbar    Family History  Problem Relation Age of Onset   Cancer Mother     Coronary artery disease Father    Past Surgical History:  Procedure Laterality Date   ARTHRODESIS METATARSALPHALANGEAL JOINT (MTPJ) Right 10/08/2015   Procedure: RIGHT HALLUX METATARSAL PHALANGEAL JOINT  ARTHRODESIS;  Surgeon: Norleen Armor, MD;  Location: [facility];  Service: Orthopedics;  Laterality: Right;   caesarean section     CESAREAN SECTION     HAMMERTOE RECONSTRUCTION WITH WEIL OSTEOTOMY Right 10/08/2015   Procedure: RIGHT SECOND METATARSAL WEIL HAMMERTOE CORRECTION;  Surgeon: Norleen Armor, MD;  Location: [facility];  Service: Orthopedics;  Laterality: Right;   TONSILLECTOMY     Social History   Occupational History   Not on file  Tobacco Use   Smoking status: Former   Smokeless tobacco: Never   Tobacco comments:    only in h.s.  Vaping Use   Vaping status: Never Used  Substance and Sexual Activity   Alcohol use: Yes    Comment: occ   Drug use: No   Sexual activity: Not Currently    Birth control/protection: Post-menopausal    Comment: 1st intercourse- 20, partners- 1

## 2024-06-17 NOTE — Progress Notes (Signed)
 Patient says that her hips are improving, and she believes that the shockwave is helping. She says that the shoulder is not as bothersome as it as at her last visit, although she does still feel some discomfort.

## 2024-06-20 ENCOUNTER — Ambulatory Visit: Admitting: Sports Medicine

## 2024-06-26 ENCOUNTER — Ambulatory Visit: Admitting: Sports Medicine

## 2024-06-27 ENCOUNTER — Encounter: Payer: Self-pay | Admitting: Sports Medicine

## 2024-06-27 ENCOUNTER — Ambulatory Visit: Admitting: Sports Medicine

## 2024-06-27 DIAGNOSIS — G8929 Other chronic pain: Secondary | ICD-10-CM

## 2024-06-27 DIAGNOSIS — M5416 Radiculopathy, lumbar region: Secondary | ICD-10-CM

## 2024-06-27 DIAGNOSIS — M25551 Pain in right hip: Secondary | ICD-10-CM | POA: Diagnosis not present

## 2024-06-27 DIAGNOSIS — M67951 Unspecified disorder of synovium and tendon, right thigh: Secondary | ICD-10-CM

## 2024-06-27 DIAGNOSIS — M25552 Pain in left hip: Secondary | ICD-10-CM

## 2024-06-27 DIAGNOSIS — M48061 Spinal stenosis, lumbar region without neurogenic claudication: Secondary | ICD-10-CM | POA: Diagnosis not present

## 2024-06-27 DIAGNOSIS — M67952 Unspecified disorder of synovium and tendon, left thigh: Secondary | ICD-10-CM | POA: Diagnosis not present

## 2024-06-27 NOTE — Progress Notes (Signed)
 Kimberly Cox - 78 y.o. female MRN [id number]  Date of birth: 1946-03-27  Office Visit Note: Visit Date: 06/27/2024 PCP: Sebastian Beverley NOVAK, MD Referred by: Sebastian Beverley NOVAK, MD  Subjective: Chief Complaint  Patient presents with   Left Hip - Follow-up   Right Hip - Follow-up   HPI: Kimberly Cox is a pleasant 78 y.o. female who presents today for follow-up of bilateral lateral hip pain.  She is doing very well. Some mild stiffness when first getting up to move, but in general largely improved and very pleased with pain reduction and function. Did stop nitroglycerin  patch like we talked at last visit. Using Celebrex  100mg  1-2x only as needed.  Does have CT-guided lumbar ESI planned for early November (5th) to help with her stenosis back pain - received excellent relief from this in the past.  Pertinent ROS were reviewed with the patient and found to be negative unless otherwise specified above in HPI.   Assessment & Plan: Visit Diagnoses:  1. Chronic hip pain, bilateral   2. Tendinopathy of right gluteus medius   3. Tendinopathy of left gluteus medius   4. Spinal stenosis of lumbar region with radiculopathy    Plan: Impression is markedly improved chronic bilateral hip pain which has MRI-confirmed right and left gluteal tendinosis with associated partial-thickness tearing. She has made great progress with extracorporeal shockwave therapy for both hips and her pain is minimal at this point and function improved. We did repeat our final ECSWT treatment today, tolerated well. At this point, I'd like to take a 6-8 week holiday and have her continue her HEP on a 2x/week basis more for maintenance and prevention and then follow-up at at this time to ensure she is still doing well. Fortunately she has responded well with our treatment, because given degree of tearing, surgical intervention cannot be ruled out. The step before this would be consideration of PRP, but given improvement will  hold for now. She is fine to continue Celebrex  100mg  daily as needed, prefer 1x/day opposed to 2x/day unless really needs. She has her lumbar ESI set for 07/10/24 for her stenosis which should help quite significantly for that. F/u in 6-8 weeks, may call or return sooner if needed.   Follow-up: Return in about 7 weeks (around 08/15/2024) for bilateral hips.   Meds & Orders: No orders of the defined types were placed in this encounter.  No orders of the defined types were placed in this encounter.    Procedures: Procedure: ECSWT Indications:  Gluteus minimus/medius tendinopathy   Procedure Details Consent: Risks of procedure as well as the alternatives and risks of each were explained to the patient.  Verbal consent for procedure obtained. Time Out: Verified patient identification, verified procedure, site was marked, verified correct patient position. The area was cleaned with alcohol swab.     The left GT and gluteus tendons were targeted for Extracorporeal shockwave therapy.    Preset: Trochanteric bursitis/lateral hip pain Power Level: 120 mJ  Frequency: 14 Hz Impulse/cycles: 2700   Head size: Regular   The right GT and gluteus tendons were targeted for Extracorporeal shockwave therapy.    Preset: Trochanteric bursitis/lateral hip pain Power Level: 120  mJ  Frequency: 14 Hz Impulse/cycles: 2700 Head size: Regular   Patient tolerated procedure well without immediate complications.       Clinical History: No specialty comments available.  She reports that she has quit smoking. She has never used smokeless tobacco. No results for input(s):  HGBA1C, LABURIC in the last 8760 hours.  Objective:     Physical Exam  Gen: Well-appearing, in no acute distress; non-toxic CV: Well-perfused. Warm.  Resp: Breathing unlabored on room air; no wheezing. Psych: Fluid speech in conversation; appropriate affect; normal thought process  Ortho Exam - Bilateral hips: Mild TTP with deep  palpation around b/l greater trochanteric regions, but marked improved from previous visits. No bursitis or soft tissue swelling palpable today. No bony restrictions with logroll testing. IT-band without fascial restriction bilaterally.  Imaging:  *MRIs reviewed today  Narrative & Impression  CLINICAL DATA:  Low back pain, bilateral lower extremity numbness and tingling for 5 years   EXAM: MRI LUMBAR SPINE WITHOUT CONTRAST   TECHNIQUE: Multiplanar, multisequence MR imaging of the lumbar spine was performed. No intravenous contrast was administered.   COMPARISON:  CT lumbar spine 06/26/2019   FINDINGS: Segmentation:  Standard.   Alignment:  Physiologic.   Vertebrae: No acute fracture, evidence of discitis, or aggressive bone lesion.   Conus medullaris and cauda equina: Conus extends to the T12 level. Conus and cauda equina appear normal.   Paraspinal and other soft tissues: No acute paraspinal abnormality.   Disc levels:   Disc spaces: Severe disc height loss at L1-2, L2-3, L3-4 and L4-5. Mild disc height loss at L5-S1.   T12-L1: Large central disc extrusions with cephalad and caudal migration of disc material impressing upon the thecal sac. Mild bilateral facet arthropathy. Minimal central canal narrowing. No foraminal stenosis.   L1-L2: Large partially calcified central disc extrusion coursing cephalad unchanged from the prior CT dated 06/26/2019 and impressing upon the thecal sac. Moderate bilateral facet arthropathy. Moderate central canal stenosis. Moderate bilateral foraminal stenosis.   L2-L3: Mild disc osteophyte complex. Moderate bilateral facet arthropathy. Moderate central canal stenosis. Bilateral lateral recess stenosis. Moderate-severe right foraminal stenosis. Mild left foraminal stenosis.   L3-L4: Moderate disc bulge. Mild bilateral facet arthropathy. Moderate central canal stenosis. Bilateral subarticular recess stenosis. Moderate right and mild  left foraminal stenosis.   L4-L5: Mild disc bulge. Moderate bilateral facet arthropathy. Severe central canal stenosis. Mild-moderate right foraminal stenosis. Moderate left foraminal stenosis.   L5-S1: Mild disc bulge with a broad central disc protrusion. Mild bilateral lateral recess stenosis. Moderate bilateral facet arthropathy. No right foraminal stenosis. Mild left foraminal stenosis. No central canal stenosis.   IMPRESSION: 1. Diffuse lumbar spine spondylosis as described above. 2. T12-L1: Large central disc extrusions with cephalad and caudal migration of disc material impressing upon the thecal sac. Mild bilateral facet arthropathy. Minimal central canal narrowing. No foraminal stenosis. 3. L4-L5: Mild disc bulge. Moderate bilateral facet arthropathy. Severe central canal stenosis. Mild-moderate right foraminal stenosis. Moderate left foraminal stenosis. 4. L2-L3: Mild disc osteophyte complex. Moderate bilateral facet arthropathy. Moderate central canal stenosis. Bilateral lateral recess stenosis. Moderate-severe right foraminal stenosis. 5. L3-L4: Moderate disc bulge. Mild bilateral facet arthropathy. Moderate central canal stenosis. Bilateral subarticular recess stenosis. Moderate right foraminal stenosis. 6. L1-L2: Large partially calcified central disc extrusion coursing cephalad unchanged from the prior CT dated 06/26/2019 and impressing upon the thecal sac. Moderate bilateral facet arthropathy. Moderate central canal stenosis. Moderate bilateral foraminal stenosis.     Electronically Signed   By: Julaine Blanch M.D.   On: 08/10/2023 15:41    MR Hip Left w/o contrast CLINICAL DATA:  Left hip pain and tenderness for 2 years  EXAM: MR OF THE LEFT HIP WITHOUT CONTRAST  TECHNIQUE: Multiplanar, multisequence MR imaging was performed. No intravenous contrast was administered.  COMPARISON:  None Available.  FINDINGS: Bones:  No hip fracture, dislocation or  avascular necrosis.  No periosteal reaction or bone destruction. No aggressive osseous lesion.  Mild osteoarthritis of the left SI joint. No SI joint widening or erosive changes.  Severe disc height loss at L4-5.  Articular cartilage and labrum  Articular cartilage: Mild partial eschars loss of the left femoral head and acetabulum.  Labrum:  Left labral degeneration and superior labral tear.  Joint or bursal effusion  Joint effusion:  No hip joint effusion.  No SI joint effusion.  Bursae: Moderate amount of fluid in the left greater trochanteric bursa.  Muscles and tendons  Flexors: Normal.  Extensors: Normal.  Abductors: Normal.  Adductors: Normal.  Gluteals: High-grade partial is tear of the left gluteus minimus tendon insertion. Mild tendinosis of the left gluteus medius tendon insertion with a small full-thickness tear along the anterior aspect.  Hamstrings: Normal.  Other findings  No pelvic free fluid. No fluid collection or hematoma. No inguinal lymphadenopathy. No inguinal hernia.  IMPRESSION: 1. High-grade partial is tear of the left gluteus minimus tendon insertion. 2. Mild tendinosis of the left gluteus medius tendon insertion with a small full-thickness tear along the anterior aspect. 3. Mild osteoarthritis of the left hip. Left labral degeneration and superior labral tear. 4. Left greater trochanteric bursitis.  Electronically Signed   By: Julaine Blanch M.D.   On: 04/20/2024 09:56  Past Medical/Family/Surgical/Social History: Medications & Allergies reviewed per EMR, new medications updated. Patient Active Problem List   Diagnosis Date Noted   Impacted cerumen of right ear 05/22/2024   Sensorineural hearing loss, bilateral 05/22/2024   Upper airway cough syndrome 02/22/2024   Other specified disorders of eustachian tube, right ear 01/23/2024   History of pneumonia 01/23/2024   Diuretic-induced hypokalemia 03/31/2020   Sciatica of right  side 05/01/2018   Piriformis syndrome of left side 11/27/2017   GERD with esophagitis 08/22/2017   Asthmatic bronchitis 08/22/2017   Prediabetes 02/02/2017   Pure hyperglyceridemia 02/02/2017   Degenerative cervical disc 01/26/2016   Degenerative lumbar disc 01/26/2016   Spinal stenosis of lumbar region with radiculopathy 01/20/2016   Routine general medical examination at a health care facility 07/18/2014   Hereditary and idiopathic peripheral neuropathy 01/31/2008   Hyperlipidemia LDL goal <130 12/05/2007   Primary hypertension 12/05/2007   Past Medical History:  Diagnosis Date   Anxiety    Anxiety disorder    Concussion Dec 13th, 2011   fell, striking her head against a wall, developed dizziness - UC eval c/w concussion. Had follow-up and did ok.    History of chest pain    Hyperlipidemia    Hypertension    Plantar fasciitis    Renal stone    Spinal stenosis, lumbar    Family History  Problem Relation Age of Onset   Cancer Mother    Coronary artery disease Father    Past Surgical History:  Procedure Laterality Date   ARTHRODESIS METATARSALPHALANGEAL JOINT (MTPJ) Right 10/08/2015   Procedure: RIGHT HALLUX METATARSAL PHALANGEAL JOINT  ARTHRODESIS;  Surgeon: Norleen Armor, MD;  Location: [facility];  Service: Orthopedics;  Laterality: Right;   caesarean section     CESAREAN SECTION     HAMMERTOE RECONSTRUCTION WITH WEIL OSTEOTOMY Right 10/08/2015   Procedure: RIGHT SECOND METATARSAL WEIL HAMMERTOE CORRECTION;  Surgeon: Norleen Armor, MD;  Location: [facility];  Service: Orthopedics;  Laterality: Right;   TONSILLECTOMY     Social History  Occupational History   Not on file  Tobacco Use   Smoking status: Former   Smokeless tobacco: Never   Tobacco comments:    only in h.s.  Vaping Use   Vaping status: Never Used  Substance and Sexual Activity   Alcohol use: Yes    Comment: occ   Drug use: No   Sexual activity: Not Currently    Birth  control/protection: Post-menopausal    Comment: 1st intercourse- 20, partners- 1

## 2024-06-27 NOTE — Progress Notes (Signed)
 Patient says that she is feeling overall pretty good. She still feels stiff and sore after sitting or resting, but after 4-5 steps she feels that she is able to move better.

## 2024-07-02 ENCOUNTER — Encounter (INDEPENDENT_AMBULATORY_CARE_PROVIDER_SITE_OTHER): Payer: Self-pay | Admitting: Otolaryngology

## 2024-07-02 ENCOUNTER — Ambulatory Visit (INDEPENDENT_AMBULATORY_CARE_PROVIDER_SITE_OTHER): Admitting: Otolaryngology

## 2024-07-02 VITALS — BP 121/71 | HR 78 | Temp 98.6°F

## 2024-07-02 DIAGNOSIS — H6981 Other specified disorders of Eustachian tube, right ear: Secondary | ICD-10-CM | POA: Diagnosis not present

## 2024-07-02 DIAGNOSIS — J392 Other diseases of pharynx: Secondary | ICD-10-CM | POA: Diagnosis not present

## 2024-07-02 DIAGNOSIS — H905 Unspecified sensorineural hearing loss: Secondary | ICD-10-CM | POA: Diagnosis not present

## 2024-07-02 DIAGNOSIS — H903 Sensorineural hearing loss, bilateral: Secondary | ICD-10-CM

## 2024-07-02 MED ORDER — GUAIFENESIN ER 1200 MG PO TB12: 1.0000 | ORAL_TABLET | Freq: Two times a day (BID) | ORAL | 3 refills | Status: DC | PRN

## 2024-07-02 NOTE — Progress Notes (Signed)
 Follow up: Right ear eustachian tube dysfunction, asymmetric hearing loss  Discussed the use of AI scribe software for clinical note transcription with the patient, who gave verbal consent to proceed.  History of Present Illness Kimberly Cox is a 78 year old female who presents with persistent right ear fullness and hearing issues.  She has experienced a sensation of her right ear being plugged for the past six weeks. She uses Flonase  daily and performs Valsalva maneuvers, which occasionally help to relieve the sensation, but not consistently. No ear pain or drainage is present.  A hearing test six weeks ago indicated worse low-frequency hearing in the right ear compared to the left, while high-frequency hearing was similar in both ears.  She frequently clears her throat, especially in the mornings, and feels as though her head is full. She is unable to cough anything up and finds it embarrassing to talk due to a 'froggy' voice. She acknowledges limited water intake.  No ear pain or drainage. Frequent throat clearing and a sensation of fullness in her head.    Exam: General: Communicates without difficulty, well nourished, no acute distress. Head: Normocephalic, no evidence injury, no tenderness, facial buttresses intact without stepoff. Face/sinus: No tenderness to palpation and percussion. Facial movement is normal and symmetric. Eyes: PERRL, EOMI. No scleral icterus, conjunctivae clear. Neuro: CN II exam reveals vision grossly intact.  No nystagmus at any point of gaze. Ears: Auricles well formed without lesions.  Ear canals are intact without mass or lesion.  No erythema or edema is appreciated.  The TMs are intact without fluid. Nose: External evaluation reveals normal support and skin without lesions.  Dorsum is intact.  Anterior rhinoscopy reveals congested mucosa over anterior aspect of inferior turbinates and intact septum.  No purulence noted. Oral:  Oral cavity and oropharynx are  intact, symmetric, without erythema or edema.  Mucosa is moist without lesions. Neck: Full range of motion without pain.  There is no significant lymphadenopathy.  No masses palpable.  Thyroid  bed within normal limits to palpation.  Parotid glands and submandibular glands equal bilaterally without mass.  Trachea is midline. Neuro:  CN 2-12 grossly intact.    Assessment and Plan Assessment & Plan Right ear eustachian tube dysfunction with asymmetric sensorineural hearing loss Persistent right ear eustachian tube dysfunction with asymmetric sensorineural hearing loss. Previous audiometry indicated worse low-frequency hearing in the right ear compared to the left.  - Ordered MRI to evaluate for vestibular nerve tumor. - Continue Fluticasone  nasal spray daily. - Perform Valsalva maneuver 20 times a day.  Chronic throat clearing and thick mucus Chronic throat clearing due to thick mucus, likely exacerbated by dehydration. No productive cough observed. - Prescribed Mucinex 1200 mg twice daily as needed. - Increase water intake to improve hydration and thin mucus.

## 2024-07-08 ENCOUNTER — Encounter: Payer: Self-pay | Admitting: Radiology

## 2024-07-08 ENCOUNTER — Other Ambulatory Visit: Payer: Self-pay | Admitting: Family Medicine

## 2024-07-08 DIAGNOSIS — Z1231 Encounter for screening mammogram for malignant neoplasm of breast: Secondary | ICD-10-CM

## 2024-07-10 ENCOUNTER — Other Ambulatory Visit: Payer: Self-pay | Admitting: Sports Medicine

## 2024-07-10 DIAGNOSIS — M5416 Radiculopathy, lumbar region: Secondary | ICD-10-CM | POA: Diagnosis not present

## 2024-07-16 ENCOUNTER — Ambulatory Visit: Admitting: Sports Medicine

## 2024-07-16 ENCOUNTER — Ambulatory Visit (HOSPITAL_COMMUNITY)

## 2024-08-09 ENCOUNTER — Other Ambulatory Visit: Payer: Self-pay | Admitting: Sports Medicine

## 2024-08-09 ENCOUNTER — Ambulatory Visit

## 2024-08-15 ENCOUNTER — Ambulatory Visit: Admitting: Sports Medicine

## 2024-08-15 ENCOUNTER — Encounter: Payer: Self-pay | Admitting: Sports Medicine

## 2024-08-15 DIAGNOSIS — M67952 Unspecified disorder of synovium and tendon, left thigh: Secondary | ICD-10-CM

## 2024-08-15 DIAGNOSIS — M25552 Pain in left hip: Secondary | ICD-10-CM

## 2024-08-15 DIAGNOSIS — G8929 Other chronic pain: Secondary | ICD-10-CM

## 2024-08-15 DIAGNOSIS — M67951 Unspecified disorder of synovium and tendon, right thigh: Secondary | ICD-10-CM

## 2024-08-15 DIAGNOSIS — M25551 Pain in right hip: Secondary | ICD-10-CM

## 2024-08-15 NOTE — Progress Notes (Unsigned)
 Kimberly Cox - 78 y.o. female MRN [id number]  Date of birth: 13-Aug-1946  Office Visit Note: Visit Date: 08/15/2024 PCP: Sebastian Beverley NOVAK, MD Referred by: Sebastian Beverley NOVAK, MD  Subjective: Chief Complaint  Patient presents with   Left Hip - Follow-up   Right Hip - Follow-up   HPI: Kimberly Cox is a pleasant 78 y.o. female who presents today for follow-up of chronic bilateral lateral hip pain.  We last saw Amandine back in October.  We have performed several different treatments for her hips as she did respond to extracorporeal shockwave therapy as well as symmetric glycerin patch protocols previously.  Overall the hips have been doing very well, mild pain but livable and able to walk, ADLs without much pain.  Just here over the last few days she did begin having more pain, L > R hip.  She is still using Celebrex  100 mg daily as needed, does note benefit with this.  CT-guided lumbar ESI planned for early November (5th).  Pertinent ROS were reviewed with the patient and found to be negative unless otherwise specified above in HPI.   Assessment & Plan: Visit Diagnoses:  1. Chronic hip pain, bilateral   2. Tendinopathy of right gluteus medius   3. Tendinopathy of left gluteus medius    Plan: Impression is lateral hip pain with GT PS and MRI confirmed right and left gluteal tendinopathy with associated partial-thickness tearing.  Just recently had mild exacerbation of her pain, L > R.  Still has relatively well-preserved strength, although has stopped most of her home exercises given her previously doing well.  We did perform shockwave therapy for the left lateral hip today.  She will continue Celebrex  100 mg daily as needed.  May use heat to the hips.  Do think it would be beneficial to restart hip on a 2 times per week basis.  She is traveling here over the holiday, we will bring her back over the next 1-2 weeks to reevaluate, if making improvement may consider 1 additional shockwave  trial.  Could consider trochanteric injection if still struggling at that time.  Follow-up: Return for F/u 1-2 weeks .   Meds & Orders: No orders of the defined types were placed in this encounter.  No orders of the defined types were placed in this encounter.    Procedures: Procedure: ECSWT Indications:  Gluteus minimus/medius tendinopathy   Procedure Details Consent: Risks of procedure as well as the alternatives and risks of each were explained to the patient.  Verbal consent for procedure obtained. Time Out: Verified patient identification, verified procedure, site was marked, verified correct patient position. The area was cleaned with alcohol swab.     The left GT and gluteus tendons were targeted for Extracorporeal shockwave therapy.    Preset: Trochanteric bursitis/lateral hip pain Power Level: 120 mJ   Frequency: 14 Hz Impulse/cycles: 3000   Head size: Regular      Clinical History: No specialty comments available.  She reports that she has quit smoking. She has never used smokeless tobacco. No results for input(s): HGBA1C, LABURIC in the last 8760 hours.  Objective:    Physical Exam  Gen: Well-appearing, in no acute distress; non-toxic CV: Well-perfused. Warm.  Resp: Breathing unlabored on room air; no wheezing. Psych: Fluid speech in conversation; appropriate affect; normal thought process  Ortho Exam - Bilateral hips: + TTP over the greater trochanteric region left > right.  Significant bursitis of the right.  Ambulation is intact without  Trendelenburg.  Imaging: No results found.  Past Medical/Family/Surgical/Social History: Medications & Allergies reviewed per EMR, new medications updated. Patient Active Problem List   Diagnosis Date Noted   Impacted cerumen of right ear 05/22/2024   Sensorineural hearing loss, bilateral 05/22/2024   Upper airway cough syndrome 02/22/2024   Other specified disorders of eustachian tube, right ear 01/23/2024    History of pneumonia 01/23/2024   Diuretic-induced hypokalemia 03/31/2020   Sciatica of right side 05/01/2018   Piriformis syndrome of left side 11/27/2017   GERD with esophagitis 08/22/2017   Asthmatic bronchitis 08/22/2017   Prediabetes 02/02/2017   Pure hyperglyceridemia 02/02/2017   Degenerative cervical disc 01/26/2016   Degenerative lumbar disc 01/26/2016   Spinal stenosis of lumbar region with radiculopathy 01/20/2016   Routine general medical examination at a health care facility 07/18/2014   Hereditary and idiopathic peripheral neuropathy 01/31/2008   Hyperlipidemia LDL goal <130 12/05/2007   Primary hypertension 12/05/2007   Past Medical History:  Diagnosis Date   Anxiety    Anxiety disorder    Concussion Dec 13th, 2011   fell, striking her head against a wall, developed dizziness - UC eval c/w concussion. Had follow-up and did ok.    History of chest pain    Hyperlipidemia    Hypertension    Plantar fasciitis    Renal stone    Spinal stenosis, lumbar    Family History  Problem Relation Age of Onset   Cancer Mother    Coronary artery disease Father    Past Surgical History:  Procedure Laterality Date   ARTHRODESIS METATARSALPHALANGEAL JOINT (MTPJ) Right 10/08/2015   Procedure: RIGHT HALLUX METATARSAL PHALANGEAL JOINT  ARTHRODESIS;  Surgeon: Norleen Armor, MD;  Location: [facility];  Service: Orthopedics;  Laterality: Right;   caesarean section     CESAREAN SECTION     HAMMERTOE RECONSTRUCTION WITH WEIL OSTEOTOMY Right 10/08/2015   Procedure: RIGHT SECOND METATARSAL WEIL HAMMERTOE CORRECTION;  Surgeon: Norleen Armor, MD;  Location: [facility];  Service: Orthopedics;  Laterality: Right;   TONSILLECTOMY     Social History   Occupational History   Not on file  Tobacco Use   Smoking status: Former   Smokeless tobacco: Never   Tobacco comments:    only in h.s.  Vaping Use   Vaping status: Never Used  Substance and Sexual Activity    Alcohol use: Yes    Comment: occ   Drug use: No   Sexual activity: Not Currently    Birth control/protection: Post-menopausal    Comment: 1st intercourse- 20, partners- 1

## 2024-08-15 NOTE — Progress Notes (Unsigned)
 Patient says that she was feeling better until this past Monday. She says that she had another appointment where she sat for a couple of hours, and when she stood up began having pain again over her lateral hip. She says that she began using Lidocaine  patches, which have given her some relief. She has not continued with her home exercises in the time since her last visit.

## 2024-08-16 ENCOUNTER — Ambulatory Visit
Admission: RE | Admit: 2024-08-16 | Discharge: 2024-08-16 | Disposition: A | Source: Ambulatory Visit | Attending: Family Medicine

## 2024-08-16 ENCOUNTER — Encounter: Payer: Self-pay | Admitting: Sports Medicine

## 2024-08-16 DIAGNOSIS — Z1231 Encounter for screening mammogram for malignant neoplasm of breast: Secondary | ICD-10-CM | POA: Diagnosis not present

## 2024-08-28 ENCOUNTER — Encounter: Payer: Self-pay | Admitting: Sports Medicine

## 2024-08-28 ENCOUNTER — Ambulatory Visit: Admitting: Sports Medicine

## 2024-08-28 DIAGNOSIS — M67951 Unspecified disorder of synovium and tendon, right thigh: Secondary | ICD-10-CM | POA: Diagnosis not present

## 2024-08-28 DIAGNOSIS — M25552 Pain in left hip: Secondary | ICD-10-CM | POA: Diagnosis not present

## 2024-08-28 DIAGNOSIS — M25551 Pain in right hip: Secondary | ICD-10-CM | POA: Diagnosis not present

## 2024-08-28 DIAGNOSIS — G8929 Other chronic pain: Secondary | ICD-10-CM

## 2024-08-28 DIAGNOSIS — M7062 Trochanteric bursitis, left hip: Secondary | ICD-10-CM | POA: Diagnosis not present

## 2024-08-28 DIAGNOSIS — S76012D Strain of muscle, fascia and tendon of left hip, subsequent encounter: Secondary | ICD-10-CM | POA: Diagnosis not present

## 2024-08-28 MED ORDER — LIDOCAINE HCL 1 % IJ SOLN
2.0000 mL | INTRAMUSCULAR | Status: AC | PRN
  Administered 2024-08-28: 2 mL

## 2024-08-28 MED ORDER — BUPIVACAINE HCL 0.25 % IJ SOLN
2.0000 mL | INTRAMUSCULAR | Status: AC | PRN
  Administered 2024-08-28: 2 mL via INTRA_ARTICULAR

## 2024-08-28 MED ORDER — BETAMETHASONE SOD PHOS & ACET 6 (3-3) MG/ML IJ SUSP
6.0000 mg | INTRAMUSCULAR | Status: AC | PRN
  Administered 2024-08-28: 6 mg via INTRA_ARTICULAR

## 2024-08-28 NOTE — Progress Notes (Signed)
 Patient says that her pain is in the left lateral hip and thigh, and a couple of nights ago she felt a flare up of pain in her right hip. She did a figure 4 stretch and noticed improvements when she woke up the next morning. She is asking if she should have shockwave therapy or an injection today. She does have an upcoming trip and is concerned about navigating the airport while she is in pain.

## 2024-08-28 NOTE — Progress Notes (Signed)
 "  Kimberly Cox - 78 y.o. female MRN [id number]  Date of birth: 1946/04/13  Office Visit Note: Visit Date: 08/28/2024 PCP: Sebastian Beverley NOVAK, MD Referred by: Sebastian Beverley NOVAK, MD  Subjective: Chief Complaint  Patient presents with   Left Hip - Follow-up   Right Hip - Follow-up   HPI: Kimberly Cox is a pleasant 78 y.o. female who presents today for follow-up chronic bilateral lateral hip pain, L > R.  Arwa continues with more left lateral hip pain, although the right is still bothersome for her.  She does state that her pain is in the left lateral hip and thigh, and a couple of nights ago she felt a flare up of pain in her right hip. She did a figure 4 stretch and noticed improvements when she woke up the next morning. She is asking if she should have shockwave therapy or an injection today. She does have an upcoming trip and is concerned about navigating the airport while she is in pain.  She does continue her Celebrex  100 mg once to twice daily as needed, does find this beneficial.  In the past we have talked about PRP, specifically for the left hip.  She is still open to this but is hesitant to come off of Celebrex .  Pertinent ROS were reviewed with the patient and found to be negative unless otherwise specified above in HPI.   Assessment & Plan: Visit Diagnoses:  1. Chronic hip pain, bilateral   2. Tear of left gluteus minimus tendon, subsequent encounter   3. Tendinopathy of right gluteus medius   4. Trochanteric bursitis, left hip    Plan: Impression is chronic bilateral (L > R) GTPS with MRI confirmed right and left gluteal tendinopathy with associated higher grade partial-thickness tearing of the left gluteus minimus > medius.  We have treated this over the years with extracorporeal shockwave therapy, nitroglycerin  patch protocol, and injections.  She is leaving for a trip around the holidays and will be navigating through the airport.  Given her acute pain, we did proceed  with left and right greater trochanteric injection, patient tolerated well.  May use ice/heat and or Tylenol  for pain control.  She will continue her Celebrex  100 mg once to twice daily as needed.  Would like her to relax around the holidays and ease on her home exercise regimen.  Starting in early January she will begin these times a week basis for maintenance and preservation of her gluteal tendons and hip abduction strength.  She still could be a candidate for PRP injection therapy for her partial-thickness tearing of the left gluteus minimus > medius, but would like a > 6 week washout period from CSI.  We will follow-up with her in about 6 weeks to reevaluate how the hips are doing and discuss further at that time.  Follow-up: Return in about 6 weeks (around 10/09/2024) for L > R lateral hip .   Meds & Orders: No orders of the defined types were placed in this encounter.   Orders Placed This Encounter  Procedures   Large Joint Inj   Large Joint Inj     Procedures: Large Joint Inj: R greater trochanter on 08/28/2024 11:52 AM Indications: pain Details: 25 G 1.5 in needle, lateral approach Medications: 2 mL lidocaine  1 %; 2 mL bupivacaine  0.25 %; 6 mg betamethasone  acetate-betamethasone  sodium phosphate  6 (3-3) MG/ML Outcome: tolerated well, no immediate complications  Greater Trochanteric Bursa Injection, Right After discussion on risk/benefits/indications, an informed  verbal consent was obtained and a timeout was performed. The patient was lying in lateral recumbent position on exam table. The area overlying the trochanteric bursa was prepped with Betadine and alcohol swab then injected with 2:2:1 lidocaine :bupivicaine:celestone . Patient tolerated procedure well without immediate complications.  Procedure, treatment alternatives, risks and benefits explained, specific risks discussed. Consent was given by the patient. Immediately prior to procedure a time out was called to verify the correct  patient, procedure, equipment, support staff and site/side marked as required. Patient was prepped and draped in the usual sterile fashion.    Large Joint Inj: L greater trochanter on 08/28/2024 11:52 AM Indications: pain Details: 25 G 1.5 in needle, lateral approach Medications: 2 mL lidocaine  1 %; 2 mL bupivacaine  0.25 %; 6 mg betamethasone  acetate-betamethasone  sodium phosphate  6 (3-3) MG/ML Outcome: tolerated well, no immediate complications  Greater Trochanteric Bursa Injection, Left After discussion on risk/benefits/indications, an informed verbal consent was obtained and a timeout was performed. The patient was lying in lateral recumbent position on exam table. The area overlying the trochanteric bursa was prepped with Betadine and alcohol swab then injected with 2:2:1 lidocaine :bupivicaine:celestone . Patient tolerated procedure well without immediate complications. Procedure, treatment alternatives, risks and benefits explained, specific risks discussed. Consent was given by the patient. Immediately prior to procedure a time out was called to verify the correct patient, procedure, equipment, support staff and site/side marked as required. Patient was prepped and draped in the usual sterile fashion.          Clinical History: No specialty comments available.  She reports that she has quit smoking. She has never used smokeless tobacco. No results for input(s): HGBA1C, LABURIC in the last 8760 hours.  Objective:    Physical Exam  Gen: Well-appearing, in no acute distress; non-toxic CV: Well-perfused. Warm.  Resp: Breathing unlabored on room air; no wheezing. Psych: Fluid speech in conversation; appropriate affect; normal thought process  Ortho Exam - Bilateral hips:   Imaging:  Narrative & Impression  CLINICAL DATA:  Left hip pain and tenderness for 2 years   EXAM: MR OF THE LEFT HIP WITHOUT CONTRAST   TECHNIQUE: Multiplanar, multisequence MR imaging was performed.  No intravenous contrast was administered.   COMPARISON:  None Available.   FINDINGS: Bones:   No hip fracture, dislocation or avascular necrosis.   No periosteal reaction or bone destruction. No aggressive osseous lesion.   Mild osteoarthritis of the left SI joint. No SI joint widening or erosive changes.   Severe disc height loss at L4-5.   Articular cartilage and labrum   Articular cartilage: Mild partial eschars loss of the left femoral head and acetabulum.   Labrum:  Left labral degeneration and superior labral tear.   Joint or bursal effusion   Joint effusion:  No hip joint effusion.  No SI joint effusion.   Bursae: Moderate amount of fluid in the left greater trochanteric bursa.   Muscles and tendons   Flexors: Normal.   Extensors: Normal.   Abductors: Normal.   Adductors: Normal.   Gluteals: High-grade partial is tear of the left gluteus minimus tendon insertion. Mild tendinosis of the left gluteus medius tendon insertion with a small full-thickness tear along the anterior aspect.   Hamstrings: Normal.   Other findings   No pelvic free fluid. No fluid collection or hematoma. No inguinal lymphadenopathy. No inguinal hernia.   IMPRESSION: 1. High-grade partial is tear of the left gluteus minimus tendon insertion. 2. Mild tendinosis of the left gluteus medius tendon  insertion with a small full-thickness tear along the anterior aspect. 3. Mild osteoarthritis of the left hip. Left labral degeneration and superior labral tear. 4. Left greater trochanteric bursitis.     Electronically Signed   By: Julaine Blanch M.D.   On: 04/20/2024 09:56    Past Medical/Family/Surgical/Social History: Medications & Allergies reviewed per EMR, new medications updated. Patient Active Problem List   Diagnosis Date Noted   Impacted cerumen of right ear 05/22/2024   Sensorineural hearing loss, bilateral 05/22/2024   Upper airway cough syndrome 02/22/2024   Other  specified disorders of eustachian tube, right ear 01/23/2024   History of pneumonia 01/23/2024   Diuretic-induced hypokalemia 03/31/2020   Sciatica of right side 05/01/2018   Piriformis syndrome of left side 11/27/2017   GERD with esophagitis 08/22/2017   Asthmatic bronchitis 08/22/2017   Prediabetes 02/02/2017   Pure hyperglyceridemia 02/02/2017   Degenerative cervical disc 01/26/2016   Degenerative lumbar disc 01/26/2016   Spinal stenosis of lumbar region with radiculopathy 01/20/2016   Routine general medical examination at a health care facility 07/18/2014   Hereditary and idiopathic peripheral neuropathy 01/31/2008   Hyperlipidemia LDL goal <130 12/05/2007   Primary hypertension 12/05/2007   Past Medical History:  Diagnosis Date   Anxiety    Anxiety disorder    Concussion Dec 13th, 2011   fell, striking her head against a wall, developed dizziness - UC eval c/w concussion. Had follow-up and did ok.    History of chest pain    Hyperlipidemia    Hypertension    Plantar fasciitis    Renal stone    Spinal stenosis, lumbar    Family History  Problem Relation Age of Onset   Cancer Mother    Coronary artery disease Father    Past Surgical History:  Procedure Laterality Date   ARTHRODESIS METATARSALPHALANGEAL JOINT (MTPJ) Right 10/08/2015   Procedure: RIGHT HALLUX METATARSAL PHALANGEAL JOINT  ARTHRODESIS;  Surgeon: Norleen Armor, MD;  Location: [facility];  Service: Orthopedics;  Laterality: Right;   caesarean section     CESAREAN SECTION     HAMMERTOE RECONSTRUCTION WITH WEIL OSTEOTOMY Right 10/08/2015   Procedure: RIGHT SECOND METATARSAL WEIL HAMMERTOE CORRECTION;  Surgeon: Norleen Armor, MD;  Location: [facility];  Service: Orthopedics;  Laterality: Right;   TONSILLECTOMY     Social History   Occupational History   Not on file  Tobacco Use   Smoking status: Former   Smokeless tobacco: Never   Tobacco comments:    only in h.s.  Vaping Use    Vaping status: Never Used  Substance and Sexual Activity   Alcohol use: Yes    Comment: occ   Drug use: No   Sexual activity: Not Currently    Birth control/protection: Post-menopausal    Comment: 1st intercourse- 20, partners- 1   "

## 2024-09-09 ENCOUNTER — Other Ambulatory Visit: Payer: Self-pay | Admitting: Sports Medicine

## 2024-10-02 ENCOUNTER — Ambulatory Visit (INDEPENDENT_AMBULATORY_CARE_PROVIDER_SITE_OTHER): Admitting: Otolaryngology

## 2024-10-02 ENCOUNTER — Encounter (INDEPENDENT_AMBULATORY_CARE_PROVIDER_SITE_OTHER): Payer: Self-pay | Admitting: Otolaryngology

## 2024-10-02 VITALS — BP 116/71 | HR 74 | Ht 65.0 in | Wt 150.0 lb

## 2024-10-02 DIAGNOSIS — H905 Unspecified sensorineural hearing loss: Secondary | ICD-10-CM

## 2024-10-02 DIAGNOSIS — H6981 Other specified disorders of Eustachian tube, right ear: Secondary | ICD-10-CM

## 2024-10-02 DIAGNOSIS — H903 Sensorineural hearing loss, bilateral: Secondary | ICD-10-CM

## 2024-10-02 NOTE — Progress Notes (Signed)
 Patient ID: Kimberly Cox Files, female   DOB: 01-Dec-1945, 79 y.o.   MRN: [id number]  Follow up: Right ear eustachian tube dysfunction, asymmetric hearing loss  History of Present Illness [name] is a 79 year old female with right-sided asymmetric sensorineural hearing loss who presents for follow-up of persistent right ear clogging and hearing changes.  She reports a persistent sensation of right ear clogging that began several months ago following an episode of pneumonia. The sensation has gradually improved but remains noticeable. She uses Flonase  nasal spray regularly and intermittently uses Mucinex .  She reports that her right ear hearing is worse than her left, and recalls prior hearing testing. She did not undergo the previously recommended MRI. She reports difficulty hearing her husband and sometimes cannot understand speech, which impacts her daily communication.  Currently she denies any otalgia, otorrhea, or vertigo.   Exam: General: Communicates without difficulty, well nourished, no acute distress. Head: Normocephalic, no evidence injury, no tenderness, facial buttresses intact without stepoff. Face/sinus: No tenderness to palpation and percussion. Facial movement is normal and symmetric. Eyes: PERRL, EOMI. No scleral icterus, conjunctivae clear. Neuro: CN II exam reveals vision grossly intact.  No nystagmus at any point of gaze. Ears: Auricles well formed without lesions.  Ear canals are intact without mass or lesion.  No erythema or edema is appreciated.  The TMs are intact without fluid. Nose: External evaluation reveals normal support and skin without lesions.  Dorsum is intact.  Anterior rhinoscopy reveals congested mucosa over anterior aspect of inferior turbinates and intact septum.  No purulence noted. Oral:  Oral cavity and oropharynx are intact, symmetric, without erythema or edema.  Mucosa is moist without lesions. Neck: Full range of motion without pain.  There is no significant  lymphadenopathy.  No masses palpable.  Thyroid  bed within normal limits to palpation.  Parotid glands and submandibular glands equal bilaterally without mass.  Trachea is midline. Neuro:  CN 2-12 grossly intact.   Assessment & Plan Right ear eustachian tube dysfunction with asymmetric sensorineural hearing loss She has chronic right eustachian tube dysfunction with persistent but improved intermittent aural fullness. Asymmetric sensorineural hearing loss remains unchanged. The etiology is under evaluation, with an estimated 1% risk of retrocochlear lesion such as vestibular schwannoma. - Reordered MRI of the brain to evaluate for vestibular schwannoma given asymmetric sensorineural hearing loss. Discussed that MRI utilizes magnetic fields and does not involve radiation exposure. - Reviewed that the likelihood of identifying a tumor is low. - Advised that hearing aids may be considered if hearing loss becomes bothersome or impacts daily activities. - Scheduled follow-up in six months, or sooner if new symptoms develop. - MRI results will be communicated and are accessible via MyChart.

## 2024-10-09 ENCOUNTER — Ambulatory Visit: Admitting: Sports Medicine

## 2024-10-09 ENCOUNTER — Other Ambulatory Visit: Payer: Self-pay | Admitting: Sports Medicine

## 2024-10-10 ENCOUNTER — Ambulatory Visit: Admitting: Nurse Practitioner

## 2024-10-10 ENCOUNTER — Ambulatory Visit: Payer: Self-pay

## 2024-10-10 ENCOUNTER — Encounter: Payer: Self-pay | Admitting: Nurse Practitioner

## 2024-10-10 VITALS — BP 122/74 | HR 84 | Temp 98.0°F | Ht 65.0 in | Wt 154.0 lb

## 2024-10-10 DIAGNOSIS — K2100 Gastro-esophageal reflux disease with esophagitis, without bleeding: Secondary | ICD-10-CM

## 2024-10-10 DIAGNOSIS — R058 Other specified cough: Secondary | ICD-10-CM

## 2024-10-10 MED ORDER — BENZONATATE 100 MG PO CAPS: 100.0000 mg | ORAL_CAPSULE | Freq: Three times a day (TID) | ORAL | 0 refills | Status: AC | PRN

## 2024-10-10 MED ORDER — ALBUTEROL SULFATE HFA 108 (90 BASE) MCG/ACT IN AERS: 1.0000 | INHALATION_SPRAY | Freq: Four times a day (QID) | RESPIRATORY_TRACT | 0 refills | Status: AC | PRN

## 2024-10-10 MED ORDER — OMEPRAZOLE 20 MG PO CPDR: 20.0000 mg | DELAYED_RELEASE_CAPSULE | Freq: Two times a day (BID) | ORAL | 3 refills | Status: AC

## 2024-10-10 NOTE — Assessment & Plan Note (Addendum)
 Onset in March. Intermittent, non productive, associated with expiratory wheezing, cough is worse in AM Denies any heartburn or SOB or postnasal drainage. Some relief with mucinex , but stopped med 1week ago No hx of tobacco use or second hand exposure Hx of GERD with esophagitis, current use of omeprazole  20mg  in PM Treated for CAP 11/2023, completed oral abx, confirmed resolution with repeat CXR 01/2024. PFT completed today: no constriction noted, FEV1/FVC 0.71, FVC 2.08 Clear lung sounds today Possible silent GERD. Increase omeprazole  to 20mg  BID Sent albuterol  and benzonate for prn use. F/up with pcp in 2weeks

## 2024-10-10 NOTE — Patient Instructions (Signed)
 Increase omeprazole  to 20mg  BID Use benzonatate  perles and albuterol  as needed F/up with Dr. Sebastian in 2weeks

## 2024-10-10 NOTE — Progress Notes (Signed)
 "  Acute Office Visit  Subjective:    Patient ID: Kimberly Cox, female    DOB: 04-Feb-1946, 79 y.o.   MRN: [id number]  Chief Complaint  Patient presents with   Wheezing    Intermittent wheezing not with cough ongoing for a week    Upper airway cough syndrome Onset in March. Intermittent, non productive, associated with expiratory wheezing, cough is worse in AM Denies any heartburn or SOB or postnasal drainage. Some relief with mucinex , but stopped med 1week ago No hx of tobacco use or second hand exposure Hx of GERD with esophagitis, current use of omeprazole  20mg  in PM Treated for CAP 11/2023, completed oral abx, confirmed resolution with repeat CXR 01/2024. PFT completed today: no constriction noted, FEV1/FVC 0.71, FVC 2.08 Clear lung sounds today Possible silent GERD. Increase omeprazole  to 20mg  BID Sent albuterol  and benzonate for prn use. F/up with pcp in 2weeks    Show/hide medication list[1]  Reviewed past medical and social history.  Review of Systems  Respiratory:  Positive for wheezing.    Per HPI     Objective:    Physical Exam Vitals and nursing note reviewed.  Cardiovascular:     Rate and Rhythm: Normal rate and regular rhythm.     Pulses: Normal pulses.     Heart sounds: Normal heart sounds.  Pulmonary:     Effort: Pulmonary effort is normal.     Breath sounds: Normal breath sounds.  Musculoskeletal:     Right lower leg: No edema.     Left lower leg: No edema.  Lymphadenopathy:     Cervical: No cervical adenopathy.  Neurological:     Mental Status: She is alert and oriented to person, place, and time.    BP 122/74 (BP Location: Right Arm, Patient Position: Sitting, Cuff Size: Normal)   Pulse 84   Temp 98 F (36.7 C) (Oral)   Ht 5' 5 (1.651 m)   Wt 154 lb (69.9 kg)   SpO2 97%   BMI 25.63 kg/m  BP Readings from Last 3 Encounters:  10/10/24 122/74  10/02/24 116/71  07/02/24 121/71   Wt Readings from Last 3 Encounters:  10/10/24 154 lb  (69.9 kg)  10/02/24 150 lb (68 kg)  04/01/24 154 lb 9.6 oz (70.1 kg)   No results found for any visits on 10/10/24.     Assessment & Plan:   Problem List Items Addressed This Visit     GERD with esophagitis   Relevant Medications   omeprazole  (PRILOSEC) 20 MG capsule   Upper airway cough syndrome - Primary   Onset in March. Intermittent, non productive, associated with expiratory wheezing, cough is worse in AM Denies any heartburn or SOB or postnasal drainage. Some relief with mucinex , but stopped med 1week ago No hx of tobacco use or second hand exposure Hx of GERD with esophagitis, current use of omeprazole  20mg  in PM Treated for CAP 11/2023, completed oral abx, confirmed resolution with repeat CXR 01/2024. PFT completed today: no constriction noted, FEV1/FVC 0.71, FVC 2.08 Clear lung sounds today Possible silent GERD. Increase omeprazole  to 20mg  BID Sent albuterol  and benzonate for prn use. F/up with pcp in 2weeks       Relevant Medications   benzonatate  (TESSALON ) 100 MG capsule   albuterol  (VENTOLIN  HFA) 108 (90 Base) MCG/ACT inhaler   omeprazole  (PRILOSEC) 20 MG capsule   Meds ordered this encounter  Medications   benzonatate  (TESSALON ) 100 MG capsule    Sig: Take 1 capsule (100  mg total) by mouth 3 (three) times daily as needed.    Dispense:  21 capsule    Refill:  0    Supervising Provider:   BERNETA FALLOW ALFRED [[id number]]   albuterol  (VENTOLIN  HFA) 108 (90 Base) MCG/ACT inhaler    Sig: Inhale 1-2 puffs into the lungs every 6 (six) hours as needed.    Dispense:  8 g    Refill:  0    Supervising Provider:   BERNETA FALLOW ALFRED [[id number]]   omeprazole  (PRILOSEC) 20 MG capsule    Sig: Take 1 capsule (20 mg total) by mouth 2 (two) times daily before a meal.    Dispense:  180 capsule    Refill:  3    Change in dose    Supervising Provider:   BERNETA FALLOW SAYRE [[id number]]   Return in about 2 weeks (around 10/24/2024) for cough with pcp.  Roselie Mood, NP     [1]  Outpatient Medications Prior to Visit  Medication Sig   celecoxib  (CELEBREX ) 100 MG capsule TAKE 1 CAPSULE(100 MG) BY MOUTH TWICE DAILY BETWEEN MEALS AS NEEDED   Coenzyme Q10 (COQ10) 200 MG CAPS Take 200 mg by mouth daily.   fluticasone  (FLONASE ) 50 MCG/ACT nasal spray Place 2 sprays into both nostrils daily.   irbesartan-hydrochlorothiazide (AVALIDE) 150-12.5 MG tablet Take 1 tablet by mouth daily.   rosuvastatin (CRESTOR) 5 MG tablet Take 5 mg by mouth daily.    [DISCONTINUED] omeprazole  (PRILOSEC) 20 MG capsule Take 1 capsule (20 mg total) by mouth daily.   clobetasol (TEMOVATE) 0.05 % external solution Apply topically daily.   [DISCONTINUED] budesonide -formoterol  (SYMBICORT ) 160-4.5 MCG/ACT inhaler Inhale 2 puffs into the lungs 2 (two) times daily.   [DISCONTINUED] co-enzyme Q-10 30 MG capsule Take 30 mg by mouth 3 (three) times daily. Pt is taking 200MG  (Patient not taking: Reported on 10/10/2024)   [DISCONTINUED] Guaifenesin  1200 MG TB12 Take 1 tablet (1,200 mg total) by mouth 2 (two) times daily as needed (excessive mucous). (Patient not taking: Reported on 10/10/2024)   No facility-administered medications prior to visit.   "

## 2024-10-10 NOTE — Telephone Encounter (Signed)
 Pt has appointment scheduled for 2/5

## 2024-10-10 NOTE — Telephone Encounter (Signed)
 FYI Only or Action Required?: FYI only for provider: appointment scheduled on 2/5.  Patient was last seen in primary care on 04/01/2024 by Billy Knee, FNP.  Called Nurse Triage reporting Wheezing.  Symptoms began a week ago.  Interventions attempted: Nothing.  Symptoms are: stable.  Triage Disposition: See HCP Within 4 Hours (Or PCP Triage)  Patient/caregiver understands and will follow disposition?: Yes        Reason for Triage: Wheezing while breathing, patient noticed that it has been worsening.   Reason for Disposition  Wheezing is present  Answer Assessment - Initial Assessment Questions 1. RESPIRATORY STATUS: Describe your breathing? (e.g., wheezing, shortness of breath, unable to speak, severe coughing)      Expiratory wheezing  2. ONSET: When did this breathing problem begin?      1 week  3. PATTERN Does the wheezing come and go, or has it been constant since it started?      It's getting a little more pronounced, a little more often.   4. SEVERITY: How bad is your breathing? (e.g., mild, moderate, severe)      Pt is speaking comfortably in multiple full sentences at a time  6. CARDIAC HISTORY: Do you have any history of heart disease? (e.g., heart attack, angina, bypass surgery, angioplasty)      HTN  7. LUNG HISTORY: Do you have any history of lung disease?  (e.g., pulmonary embolus, asthma, emphysema)     Asthma noted on chart, though pt states I don't really consider myself an asthmatic.   8. CAUSE: What do you think is causing the breathing problem?      Pt is unsure  9. OTHER SYMPTOMS: Do you have any other symptoms? (e.g., chest pain, cough, dizziness, fever, runny nose)     Dry cough  Answer Assessment - Initial Assessment Questions .  Protocols used: Breathing Difficulty-A-AH, Cough - Acute Non-Productive-A-AH

## 2024-10-21 ENCOUNTER — Ambulatory Visit (HOSPITAL_COMMUNITY)

## 2024-10-23 ENCOUNTER — Ambulatory Visit: Admitting: Family Medicine

## 2025-03-18 ENCOUNTER — Ambulatory Visit (INDEPENDENT_AMBULATORY_CARE_PROVIDER_SITE_OTHER): Admitting: Otolaryngology
